# Patient Record
Sex: Female | Born: 1970 | Race: Black or African American | Hispanic: No | Marital: Single | Smoking: Current every day smoker
Health system: Southern US, Community
[De-identification: ages and names within clinical notes are randomized; demographics above are authoritative.]

## PROBLEM LIST (undated history)

## (undated) DIAGNOSIS — M1990 Unspecified osteoarthritis, unspecified site: Secondary | ICD-10-CM

## (undated) DIAGNOSIS — T7840XA Allergy, unspecified, initial encounter: Secondary | ICD-10-CM

## (undated) DIAGNOSIS — F32A Depression, unspecified: Secondary | ICD-10-CM

## (undated) DIAGNOSIS — I739 Peripheral vascular disease, unspecified: Secondary | ICD-10-CM

## (undated) DIAGNOSIS — Z9289 Personal history of other medical treatment: Secondary | ICD-10-CM

## (undated) DIAGNOSIS — Z8489 Family history of other specified conditions: Secondary | ICD-10-CM

## (undated) DIAGNOSIS — F419 Anxiety disorder, unspecified: Secondary | ICD-10-CM

## (undated) DIAGNOSIS — S129XXA Fracture of neck, unspecified, initial encounter: Secondary | ICD-10-CM

## (undated) DIAGNOSIS — E559 Vitamin D deficiency, unspecified: Secondary | ICD-10-CM

## (undated) DIAGNOSIS — K219 Gastro-esophageal reflux disease without esophagitis: Secondary | ICD-10-CM

## (undated) DIAGNOSIS — I219 Acute myocardial infarction, unspecified: Secondary | ICD-10-CM

## (undated) DIAGNOSIS — J45909 Unspecified asthma, uncomplicated: Secondary | ICD-10-CM

## (undated) DIAGNOSIS — R921 Mammographic calcification found on diagnostic imaging of breast: Secondary | ICD-10-CM

## (undated) DIAGNOSIS — F329 Major depressive disorder, single episode, unspecified: Secondary | ICD-10-CM

## (undated) DIAGNOSIS — F319 Bipolar disorder, unspecified: Secondary | ICD-10-CM

## (undated) DIAGNOSIS — M5020 Other cervical disc displacement, unspecified cervical region: Secondary | ICD-10-CM

## (undated) DIAGNOSIS — D649 Anemia, unspecified: Secondary | ICD-10-CM

## (undated) DIAGNOSIS — E785 Hyperlipidemia, unspecified: Secondary | ICD-10-CM

## (undated) HISTORY — DX: Allergy, unspecified, initial encounter: T78.40XA

## (undated) HISTORY — PX: APPENDECTOMY: SHX54

## (undated) HISTORY — DX: Anemia, unspecified: D64.9

## (undated) HISTORY — PX: FOOT SURGERY: SHX648

## (undated) HISTORY — PX: ABDOMINAL SURGERY: SHX537

## (undated) HISTORY — PX: TUBAL LIGATION: SHX77

## (undated) HISTORY — DX: Hyperlipidemia, unspecified: E78.5

## (undated) HISTORY — PX: BLADDER SURGERY: SHX569

## (undated) HISTORY — PX: BREAST EXCISIONAL BIOPSY: SUR124

## (undated) HISTORY — DX: Vitamin D deficiency, unspecified: E55.9

## (undated) HISTORY — PX: WISDOM TOOTH EXTRACTION: SHX21

---

## 1998-12-09 ENCOUNTER — Inpatient Hospital Stay (HOSPITAL_COMMUNITY): Admission: AD | Admit: 1998-12-09 | Discharge: 1998-12-09 | Payer: Self-pay | Admitting: Obstetrics

## 2002-07-09 ENCOUNTER — Emergency Department (HOSPITAL_COMMUNITY): Admission: EM | Admit: 2002-07-09 | Discharge: 2002-07-09 | Payer: Self-pay | Admitting: *Deleted

## 2004-12-11 ENCOUNTER — Emergency Department (HOSPITAL_COMMUNITY): Admission: EM | Admit: 2004-12-11 | Discharge: 2004-12-11 | Payer: Self-pay | Admitting: Emergency Medicine

## 2004-12-13 ENCOUNTER — Emergency Department (HOSPITAL_COMMUNITY): Admission: EM | Admit: 2004-12-13 | Discharge: 2004-12-13 | Payer: Self-pay | Admitting: *Deleted

## 2004-12-14 ENCOUNTER — Emergency Department (HOSPITAL_COMMUNITY): Admission: EM | Admit: 2004-12-14 | Discharge: 2004-12-15 | Payer: Self-pay | Admitting: Emergency Medicine

## 2004-12-18 ENCOUNTER — Encounter: Admission: RE | Admit: 2004-12-18 | Discharge: 2004-12-18 | Payer: Self-pay | Admitting: Cardiology

## 2005-02-03 ENCOUNTER — Emergency Department (HOSPITAL_COMMUNITY): Admission: EM | Admit: 2005-02-03 | Discharge: 2005-02-03 | Payer: Self-pay | Admitting: Emergency Medicine

## 2005-03-29 ENCOUNTER — Emergency Department (HOSPITAL_COMMUNITY): Admission: EM | Admit: 2005-03-29 | Discharge: 2005-03-29 | Payer: Self-pay | Admitting: Emergency Medicine

## 2005-05-02 ENCOUNTER — Inpatient Hospital Stay (HOSPITAL_COMMUNITY): Admission: AD | Admit: 2005-05-02 | Discharge: 2005-05-02 | Payer: Self-pay | Admitting: Obstetrics

## 2006-03-11 ENCOUNTER — Emergency Department (HOSPITAL_COMMUNITY): Admission: EM | Admit: 2006-03-11 | Discharge: 2006-03-11 | Payer: Self-pay | Admitting: Emergency Medicine

## 2006-07-09 ENCOUNTER — Emergency Department (HOSPITAL_COMMUNITY): Admission: EM | Admit: 2006-07-09 | Discharge: 2006-07-09 | Payer: Self-pay | Admitting: Emergency Medicine

## 2006-09-07 ENCOUNTER — Encounter: Admission: RE | Admit: 2006-09-07 | Discharge: 2006-09-07 | Payer: Self-pay | Admitting: Cardiology

## 2006-10-04 ENCOUNTER — Inpatient Hospital Stay (HOSPITAL_COMMUNITY): Admission: AD | Admit: 2006-10-04 | Discharge: 2006-10-04 | Payer: Self-pay | Admitting: Obstetrics

## 2006-11-22 ENCOUNTER — Emergency Department (HOSPITAL_COMMUNITY): Admission: EM | Admit: 2006-11-22 | Discharge: 2006-11-22 | Payer: Self-pay | Admitting: Emergency Medicine

## 2006-11-25 ENCOUNTER — Ambulatory Visit (HOSPITAL_BASED_OUTPATIENT_CLINIC_OR_DEPARTMENT_OTHER): Admission: RE | Admit: 2006-11-25 | Discharge: 2006-11-25 | Payer: Self-pay | Admitting: Urology

## 2007-02-24 ENCOUNTER — Ambulatory Visit (HOSPITAL_COMMUNITY): Admission: RE | Admit: 2007-02-24 | Discharge: 2007-02-24 | Payer: Self-pay | Admitting: Gastroenterology

## 2007-04-26 ENCOUNTER — Inpatient Hospital Stay (HOSPITAL_COMMUNITY): Admission: RE | Admit: 2007-04-26 | Discharge: 2007-04-27 | Payer: Self-pay | Admitting: General Surgery

## 2007-08-28 ENCOUNTER — Emergency Department (HOSPITAL_COMMUNITY): Admission: EM | Admit: 2007-08-28 | Discharge: 2007-08-28 | Payer: Self-pay | Admitting: Emergency Medicine

## 2007-09-11 ENCOUNTER — Emergency Department (HOSPITAL_COMMUNITY): Admission: EM | Admit: 2007-09-11 | Discharge: 2007-09-11 | Payer: Self-pay | Admitting: Emergency Medicine

## 2007-11-17 ENCOUNTER — Emergency Department (HOSPITAL_COMMUNITY): Admission: EM | Admit: 2007-11-17 | Discharge: 2007-11-17 | Payer: Self-pay | Admitting: Emergency Medicine

## 2007-11-23 ENCOUNTER — Emergency Department (HOSPITAL_COMMUNITY): Admission: EM | Admit: 2007-11-23 | Discharge: 2007-11-23 | Payer: Self-pay | Admitting: Emergency Medicine

## 2008-02-20 ENCOUNTER — Emergency Department (HOSPITAL_COMMUNITY): Admission: EM | Admit: 2008-02-20 | Discharge: 2008-02-20 | Payer: Self-pay | Admitting: Emergency Medicine

## 2008-05-05 ENCOUNTER — Emergency Department (HOSPITAL_COMMUNITY): Admission: EM | Admit: 2008-05-05 | Discharge: 2008-05-05 | Payer: Self-pay | Admitting: Emergency Medicine

## 2008-07-24 ENCOUNTER — Inpatient Hospital Stay (HOSPITAL_COMMUNITY): Admission: AD | Admit: 2008-07-24 | Discharge: 2008-07-24 | Payer: Self-pay | Admitting: Obstetrics

## 2008-10-16 ENCOUNTER — Emergency Department (HOSPITAL_COMMUNITY): Admission: EM | Admit: 2008-10-16 | Discharge: 2008-10-16 | Payer: Self-pay | Admitting: Emergency Medicine

## 2008-10-26 ENCOUNTER — Encounter: Admission: RE | Admit: 2008-10-26 | Discharge: 2008-12-11 | Payer: Self-pay | Admitting: Sports Medicine

## 2008-10-30 ENCOUNTER — Emergency Department (HOSPITAL_COMMUNITY): Admission: EM | Admit: 2008-10-30 | Discharge: 2008-10-30 | Payer: Self-pay | Admitting: Emergency Medicine

## 2009-04-23 ENCOUNTER — Inpatient Hospital Stay (HOSPITAL_COMMUNITY): Admission: AD | Admit: 2009-04-23 | Discharge: 2009-04-23 | Payer: Self-pay | Admitting: Obstetrics

## 2009-09-18 ENCOUNTER — Emergency Department (HOSPITAL_COMMUNITY): Admission: EM | Admit: 2009-09-18 | Discharge: 2009-09-18 | Payer: Self-pay | Admitting: Emergency Medicine

## 2009-12-31 ENCOUNTER — Emergency Department (HOSPITAL_COMMUNITY): Admission: EM | Admit: 2009-12-31 | Discharge: 2010-01-01 | Payer: Self-pay | Admitting: Emergency Medicine

## 2010-01-08 ENCOUNTER — Emergency Department (HOSPITAL_COMMUNITY): Admission: EM | Admit: 2010-01-08 | Discharge: 2010-01-09 | Payer: Self-pay | Admitting: Emergency Medicine

## 2010-07-16 ENCOUNTER — Emergency Department (HOSPITAL_COMMUNITY)
Admission: EM | Admit: 2010-07-16 | Discharge: 2010-07-16 | Payer: Self-pay | Source: Home / Self Care | Admitting: Emergency Medicine

## 2010-09-24 ENCOUNTER — Emergency Department (HOSPITAL_COMMUNITY)
Admission: EM | Admit: 2010-09-24 | Discharge: 2010-09-24 | Payer: Self-pay | Source: Home / Self Care | Admitting: Emergency Medicine

## 2010-10-10 ENCOUNTER — Emergency Department (HOSPITAL_COMMUNITY): Admission: EM | Admit: 2010-10-10 | Discharge: 2010-10-10 | Payer: Self-pay | Admitting: Emergency Medicine

## 2010-11-22 ENCOUNTER — Emergency Department (HOSPITAL_COMMUNITY)
Admission: EM | Admit: 2010-11-22 | Discharge: 2010-11-22 | Payer: Self-pay | Source: Home / Self Care | Admitting: Emergency Medicine

## 2011-01-04 ENCOUNTER — Encounter: Payer: Self-pay | Admitting: Cardiology

## 2011-02-23 LAB — STREP A DNA PROBE: Group A Strep Probe: NEGATIVE

## 2011-02-23 LAB — RAPID STREP SCREEN (MED CTR MEBANE ONLY): Streptococcus, Group A Screen (Direct): NEGATIVE

## 2011-03-01 LAB — DIFFERENTIAL
Basophils Absolute: 0.1 10*3/uL (ref 0.0–0.1)
Basophils Relative: 1 % (ref 0–1)
Eosinophils Absolute: 0.1 10*3/uL (ref 0.0–0.7)
Eosinophils Relative: 1 % (ref 0–5)
Lymphocytes Relative: 36 % (ref 12–46)
Lymphs Abs: 3.2 10*3/uL (ref 0.7–4.0)
Monocytes Absolute: 0.9 10*3/uL (ref 0.1–1.0)
Monocytes Relative: 10 % (ref 3–12)
Neutro Abs: 4.8 10*3/uL (ref 1.7–7.7)
Neutrophils Relative %: 53 % (ref 43–77)

## 2011-03-01 LAB — POCT I-STAT, CHEM 8
BUN: 5 mg/dL — ABNORMAL LOW (ref 6–23)
Calcium, Ion: 1.08 mmol/L — ABNORMAL LOW (ref 1.12–1.32)
Chloride: 107 mEq/L (ref 96–112)
Creatinine, Ser: 0.8 mg/dL (ref 0.4–1.2)
Glucose, Bld: 97 mg/dL (ref 70–99)
HCT: 42 % (ref 36.0–46.0)
Hemoglobin: 14.3 g/dL (ref 12.0–15.0)
Potassium: 3.6 mEq/L (ref 3.5–5.1)
Sodium: 138 mEq/L (ref 135–145)
TCO2: 26 mmol/L (ref 0–100)

## 2011-03-01 LAB — POCT CARDIAC MARKERS
CKMB, poc: <1 ng/mL — ABNORMAL LOW (ref 1.0–8.0)
CKMB, poc: <1 ng/mL — ABNORMAL LOW (ref 1.0–8.0)
Myoglobin, poc: 24.8 ng/mL (ref 12–200)
Myoglobin, poc: 29.7 ng/mL (ref 12–200)
Troponin i, poc: <0.05 ng/mL (ref 0.00–0.09)
Troponin i, poc: <0.05 ng/mL (ref 0.00–0.09)

## 2011-03-01 LAB — CBC
HCT: 39.2 % (ref 36.0–46.0)
Hemoglobin: 13 g/dL (ref 12.0–15.0)
MCHC: 33.3 g/dL (ref 30.0–36.0)
MCV: 90.5 fL (ref 78.0–100.0)
Platelets: 357 10*3/uL (ref 150–400)
RBC: 4.33 MIL/uL (ref 3.87–5.11)
RDW: 14.8 % (ref 11.5–15.5)
WBC: 9 10*3/uL (ref 4.0–10.5)

## 2011-03-01 LAB — D-DIMER, QUANTITATIVE: D-Dimer, Quant: 0.28 ug/mL-FEU (ref 0.00–0.48)

## 2011-03-01 LAB — POCT PREGNANCY, URINE: Preg Test, Ur: NEGATIVE

## 2011-03-24 LAB — GC/CHLAMYDIA PROBE AMP, GENITAL
Chlamydia, DNA Probe: NEGATIVE
GC Probe Amp, Genital: NEGATIVE

## 2011-03-24 LAB — URINALYSIS, ROUTINE W REFLEX MICROSCOPIC
Bilirubin Urine: NEGATIVE
Hgb urine dipstick: NEGATIVE
Ketones, ur: 15 mg/dL — AB
Protein, ur: NEGATIVE mg/dL
Specific Gravity, Urine: 1.02 (ref 1.005–1.030)
Urobilinogen, UA: 0.2 mg/dL (ref 0.0–1.0)

## 2011-03-24 LAB — WET PREP, GENITAL: Trich, Wet Prep: NONE SEEN

## 2011-03-24 LAB — HERPES SIMPLEX VIRUS CULTURE: Culture: NOT DETECTED

## 2011-05-01 NOTE — Op Note (Signed)
NAME:  Jasmin Stephenson, Jasmin Stephenson NO.:  [id number]   MEDICAL RECORD NO.:  [id number]          PATIENT TYPE:  AMB   LOCATION:  [facility]                         FACILITY:  [facility]   PHYSICIAN:  Maretta Bees. Vonita Moss, M.D.DATE OF BIRTH:  April 09, 1971   DATE OF PROCEDURE:  11/25/2006  DATE OF DISCHARGE:                               OPERATIVE REPORT   PREOPERATIVE DIAGNOSES:  1. Right hydronephrosis.  2. Right pyelonephritis.  3. Possible ureteral stone.   POSTOPERATIVE DIAGNOSES:  1. Right hydronephrosis.  2. Right pyelonephritis.  3. Possible ureteral stone.   PROCEDURE:  Cystoscopy, right ureteroscopy, right retrograde pyelogram  with interpretation, right double-J catheter insertion.   SURGEON:  Dr. Larey Dresser   ANESTHESIA:  General.   INDICATIONS:  This 40 year old lady has had a few weeks of intermittent  right flank pain and got worse over the weekend.  A CT scan showed right  hydronephrosis and lateral pelvic phleboliths but no definitive stone.  She has also been running fever, and I saw her in the office yesterday,  and her urine looked infected and was cultured, and I put her on Proquin  which is enteric-coated Cipro.  She is brought to the OR today for  further evaluation of her hydronephrosis and rule out the possibility of  an obstructing stone.   PROCEDURE:  The patient was brought to the operating room and placed in  lithotomy position.  External genitalia were prepped and draped in the  usual fashion.  She was cystoscoped, and the bladder was normal except  for a lot of whitish, flaky trigonitis.  I could see the left orifice,  but I could not see the right one but, fortunately, in estimating where  the right ureteral orifice was, I was able to catheterize it with a  Sensor guidewire, and then it went easily up the ureter.  I saw no  definitive stones.  Right ureteroscopy with the short rigid scope was  then performed, and I saw no evidence of calculi.  I  would say she could  have had a tiny calculi flushed up toward the kidney or might have  already passed a small stone, but the hydronephrosis that she had may  all have been related to edema or poor peristalsis from effects of the  UTI and the ureter.   Retrograde pyelogram was performed by injecting contrast through the  ureteroscope.  The ureter looked fairly delicate.  The calyces were a  little full, but no obvious obstructing lesion was seen.   I felt in view of her fever and hydronephrosis on CT that I would leave  up the double-J catheter, so I inserted a 6-French 26 cm contour double-  J with a full coil in the renal pelvis and a full coil in the bladder,  and the string was brought up per urethra and taped to her thigh.  She  was then taken to the recovery room in good condition having tolerated  the procedure well.      Maretta Bees. Vonita Moss, M.D.  Electronically Signed     LJP/MEDQ  D:  11/25/2006  T:  11/25/2006  Job:  [id number]

## 2011-05-01 NOTE — Op Note (Signed)
NAME:  Jasmin Stephenson, Jasmin Stephenson                ACCOUNT NO.:  [id number]   MEDICAL RECORD NO.:  [id number]          PATIENT TYPE:  AMB   LOCATION:  ENDO                         FACILITY:  [facility]   PHYSICIAN:  John C. Madilyn Fireman, M.D.    DATE OF BIRTH:  [birth year]   DATE OF PROCEDURE:  DATE OF DISCHARGE:  02/24/2007                               OPERATIVE REPORT   PROCEDURE:  48-hour esophageal pH study.   INDICATIONS FOR PROCEDURE:  Patient with reflux like dyspepsia which  been quite refractory to medical management with double dose proton pump  inhibitor.  There has been some discrepancy regarding of impression of a  hiatal hernia on EGD and absence of seen hiatal hernia on upper GI  series.  She is under consideration for a Nissen fundoplication.  Procedure is to better quantitate her degree of reflux before proceeding  with this.  We had tried to place a standard manometry probe and do a  standard 24-hour pH study but the nurses were unable to pass this and  manometric information is not available.   RESULTS:  The patient's overall Demeester score was 37.6 with normal  less than 14.72.  She had a total of 190 reflux episodes and heartburn  reported over total of 22 minutes with seven documented episodes of  reflux during this time.  Total fraction of time the pH was less than  four was 10.8%.   IMPRESSION:  Abnormal total reflux scores with fairly good symptom  correlation to reported heartburn.   PLAN:  Will discuss the possibility of continuing with plans for  antireflux surgery with Dr. Abbey Chatters.           ______________________________  Everardo All Madilyn Fireman, M.D.     JCH/MEDQ  D:  03/03/2007  T:  03/03/2007  Job:  [id number]   cc:   Osvaldo Shipper. Spruill, M.D.  Adolph Pollack, M.D.

## 2011-05-01 NOTE — Op Note (Signed)
NAME:  Jasmin Stephenson, Jasmin Stephenson                ACCOUNT NO.:  [id number]   MEDICAL RECORD NO.:  [id number]          PATIENT TYPE:  AMB   LOCATION:  ENDO                         FACILITY:  [facility]   PHYSICIAN:  John C. Madilyn Fireman, M.D.    DATE OF BIRTH:  [birth year]   DATE OF PROCEDURE:  02/24/2007  DATE OF DISCHARGE:                               OPERATIVE REPORT   PROCEDURE:  Esophagogastroduodenoscopy with placement of a 24-hour pH  capsule.   INDICATIONS FOR PROCEDURE:  Gastroesophageal reflux refractory to  medical management although hiatal hernia not clearly seen on barium  swallow or esophagogastroduodenoscopy.  Procedure is to formally assess  her reflux by 24-hour monitoring prior to considering antireflux  surgery.   PROCEDURE:  The patient was placed in the left lateral decubitus  position and placed on the pulse monitor with continuous low-flow oxygen  delivered by nasal cannula.  She was sedated with 75 mcg IV fentanyl and  8 mg IV Versed.  Olympus video endoscope was advanced under direct  vision into the oropharynx and esophagus. The esophagus was straight and  of normal caliber.  The squamocolumnar line at 36 cm.  There was no  visible esophagitis.  There was a lot of retching and inability to hold  air as before and a 2 cm sliding hiatal hernia could be appreciated.  More dramatically, however, as before when the stomach was entered and  air used to insufflate the stomach, she could not hold air for very long  at all, making it very difficult to adequately inspect the gastric  lumen.  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  Otherwise, fundus, body, antrum and pylorus all appeared  normal.  The duodenum was entered and both bulb and second portion were  well inspected and appeared to be within normal limits.  Scope was drawn  back into the stomach and the scope was then withdrawn back into the  esophagus and placement of the Bravo capsule was marked at 30  cm, 6 cm  above the squamocolumnar line.  The scope was then withdrawn, the Bravo  capsule deployed.  After this was done the scope was reintroduced and  the Bravo capsule was seen to be embedded in the esophageal wall from  approximately 26-30 cm. The scope was then withdrawn and the patient  returned to the recovery room in stable condition.  She tolerated the  procedure well.  There were no immediate complications.   IMPRESSION:  Small sliding hiatal hernia with evidence of extremely  incompetent gastroesophageal junction based on belching and inability to  hold air during the procedure.   PLAN:  Await data from the 24-hour pH study prior to deciding whether to  proceed with antireflux surgery.           ______________________________  Everardo All Madilyn Fireman, M.D.     JCH/MEDQ  D:  02/24/2007  T:  02/25/2007  Job:  [id number]   cc:   Kerin Perna, M.D.  Emmaline Life, M.D.

## 2011-05-01 NOTE — Op Note (Signed)
NAME:  Jasmin Stephenson, Jasmin Stephenson NO.:  [id number]   MEDICAL RECORD NO.:  [id number]          PATIENT TYPE:  AMB   LOCATION:  DAY                          [facility]ACILITY:  [facility]   PHYSICIAN:  Adolph Pollack, M.D.DATE OF BIRTH:  11-11-1971   DATE OF PROCEDURE:  04/25/2007  DATE OF DISCHARGE:                               OPERATIVE REPORT   PREOPERATIVE DIAGNOSIS:  Medically refractory gastroesophageal reflux  disease.   POSTOPERATIVE DIAGNOSIS:  Medically refractory gastroesophageal reflux  disease.   PROCEDURE:  Laparoscopic Nissen fundoplication.   SURGEON:  Adolph Pollack, M.D.   ASSISTANT:  Leonie Man, MD.   ANESTHESIA:  General.   INDICATIONS:  Ms. Leveille is a 40 year old female who suffers from  gastroesophageal reflux disease.  Despite medical management, she  continues to have breakthrough symptoms.  She now presents for elective  fundoplication.   TECHNIQUE:  She is seen in the holding area, then brought to the  operating room, placed supine on the operating table and general  anesthetic was administered.  Foley catheter was placed in her bladder.  The abdominal wall was sterilely prepped and draped.  Marcaine solution  was infiltrated in the supraumbilical region.  A supraumbilical incision  was made through the skin and subcutaneous tissue, fascia and peritoneum  entering the peritoneal cavity under direct vision.  A pursestring  suture of 0 Vicryl was placed around the fascial edges.  A Hassan trocar  was introduced into the peritoneal cavity and pneumoperitoneum created  by insufflation of CO2 gas.   The laparoscope was introduced.  Two 10 mm trocars were placed in right  upper quadrant, one in the left upper quadrant, and one 5 mm trocar was  temporarily placed in the subxiphoid area then removed.  Through the  subxiphoid incision, the Nathanson retractor was then inserted and the  left lobe of the liver was retracted anteriorly exposing the  hiatus.  I  did not notice a significant hiatal hernia.  The gastroesophageal  junction was carefully retracted to the left.  The thin gastrohepatic  attachments were then divided with the harmonic scalpel up to the level  of the right crus.  The phrenoesophageal tissue was then divided  anteriorly exposing the esophagus and the left crus.  Using careful  blunt dissection the right crus was separated from the esophagus.  A  retroesophageal window was then performed and the vagus nerve kept with  the esophagus.   The fundus of the stomach was grasped and then short gastric vessels  were divided with the harmonic scalpel up to the area of the left crus  mobilizing the fundus.  No significant bleeding was noted.   Following this, a Penrose drain was placed around the gastroesophageal  junction and then this was retracted anteriorly exposing the hiatus.  I  tightened up the hiatus using 2-0 nonabsorbable sutures.  I then grasped  the fundus and brought it through the retroesophageal window for a 360  degree wrap.  The shoeshine maneuver was performed easily.  A size 50  lighted bougie was then passed down through the  esophagus into the  stomach.  A 360 degree fundoplication was created using size 0  interrupted nonabsorbable suture.  The first two stitches involved the  left leaf of the wrap, the right leaf of the wrap, and a small bite of  esophagus.  The third suture only involved the left leaf and the right  leaf of the wrap.  The dilator was removed.  The wrap was floppy and  under no tension.   I then inspected the area.  No evidence of esophageal, gastric, splenic,  hepatic, or intestinal injury was noted.  No bleeding was noted.  The  Nathanson retractor was then removed.  The trocars were subsequently  removed and the pneumoperitoneum was released.   The supraumbilical fascial defect was closed by tightening up and tying  down the pursestring sutures.  All skin incisions were  closed with 4-0  Monocryl subcuticular stitches, followed by Steri-Strips and sterile  dressings.   She tolerated the procedure without any apparent complications and was  taken to the recovery room in satisfactory condition.      Adolph Pollack, M.D.  Electronically Signed     TJR/MEDQ  D:  04/25/2007  T:  04/25/2007  Job:  [id number]   cc:   Everardo All. Madilyn Fireman, M.D.  Fax: [phone]   Osvaldo Shipper. Spruill, M.D.  Fax: [phone]

## 2011-05-01 NOTE — H&P (Signed)
NAME:  Jasmin Stephenson, Jasmin Stephenson NO.:  [id number]   MEDICAL RECORD NO.:  [id number]          PATIENT TYPE:  AMB   LOCATION:  DAY                          FACILITY:  [facility]   PHYSICIAN:  Adolph Pollack, M.D.DATE OF BIRTH:  1970/12/31   DATE OF ADMISSION:  04/25/2007  DATE OF DISCHARGE:                              HISTORY & PHYSICAL   REASON FOR ADMISSION:  Elective Nissen fundoplication.   HISTORY OF PRESENT ILLNESS:  Jasmin Stephenson is a 40 year old female who has  had gastroesophageal reflux disease.  She has a significant abnormal 24  hour pH study with DeMeester score of 33.  She did not tolerate  manometry well.  She has no dysphasia.  Ultrasound was negative for  gallstones.  She takes Zegerid twice a day with the last breakthrough  symptoms.  She had an upper GI which was normal.  She now presents for  elective Nissen fundoplication.   PAST MEDICAL HISTORY:  1. Acute appendicitis.  2. Gastroesophageal reflux disease.   PREVIOUS OPERATIONS:  1. Appendectomy.  2. Tubal ligation.   ALLERGIES:  PENICILLIN causes a rash.   MEDICATIONS:  Zegerid.   SOCIAL HISTORY:  She is a smoker.  Occasionally has an alcoholic  beverage.  She is single and has 3 children.   PHYSICAL EXAMINATION:  Generally she is an overweight female in no acute  distress, pleasant and cooperative.  VITAL SIGNS:  Temperature is 98 degrees, blood pressure is 124/83 of  pulses 88.  O2 sats 99% on room air.  EYES:  Extraocular motion intact. No icterus.  NECK:  Supple without obvious masses.  RESPIRATORY:  Breath sounds equal and clear respirations unlabored.  CARDIOVASCULAR:  Demonstrates regular rate regular rhythm.  No murmur  heard.  ABDOMEN:  Soft, nontender.  There is a right lower quadrant scar  present.  EXTREMITIES:  Has SCD hose on.   IMPRESSION:  Medically refractory gastroesophageal reflux disease.   PLAN:  Laparoscopic, possible open Nissen fundoplication.  We have gone  over the  procedure, risks and aftercare preoperatively.      Adolph Pollack, M.D.  Electronically Signed     TJR/MEDQ  D:  04/25/2007  T:  04/25/2007  Job:  [id number]

## 2012-01-25 ENCOUNTER — Emergency Department (HOSPITAL_COMMUNITY)
Admission: EM | Admit: 2012-01-25 | Discharge: 2012-01-25 | Disposition: A | Discharge status: Home / Self Care | Payer: Self-pay | Attending: Emergency Medicine | Admitting: Emergency Medicine

## 2012-01-25 ENCOUNTER — Encounter (HOSPITAL_COMMUNITY): Payer: Self-pay | Admitting: *Deleted

## 2012-01-25 DIAGNOSIS — R21 Rash and other nonspecific skin eruption: Secondary | ICD-10-CM

## 2012-01-25 DIAGNOSIS — L299 Pruritus, unspecified: Secondary | ICD-10-CM | POA: Insufficient documentation

## 2012-01-25 DIAGNOSIS — F17200 Nicotine dependence, unspecified, uncomplicated: Secondary | ICD-10-CM | POA: Insufficient documentation

## 2012-01-25 DIAGNOSIS — B86 Scabies: Secondary | ICD-10-CM | POA: Insufficient documentation

## 2012-01-25 MED ORDER — PERMETHRIN 5 % EX CREA: TOPICAL_CREAM | CUTANEOUS | Status: AC

## 2012-01-25 NOTE — ED Notes (Signed)
Pt states "I've had this rash for about 3 days, it itches, I thought I had got in something outside or something I clean with"

## 2012-01-25 NOTE — ED Provider Notes (Signed)
History     CSN: [id number]  Arrival date & time 01/25/12  1032   First MD Initiated Contact with Patient 01/25/12 1117      Chief Complaint  Patient presents with  . Rash    (Consider location/radiation/quality/duration/timing/severity/associated sxs/prior treatment) HPI The patient presents with a rash on her arms, chest, under both breasts and the creases in her groin for the last 3 days. The patient states that it itches pretty intensely. She denies any exposures to new or different chemicals or usual bath or laundry detergents or agents. The patient has no other symptoms associated with this rash.  History reviewed. No pertinent past medical history.  Past Surgical History  Procedure Date  . Abdominal surgery   . Bladder surgery     No family history on file.  History  Substance Use Topics  . Smoking status: Current Everyday Smoker -- 1.5 packs/day  . Smokeless tobacco: Not on file  . Alcohol Use: 1.2 oz/week    2 Cans of beer per week    OB History    Grav Para Term Preterm Abortions TAB SAB Ect Mult Living                  Review of Systems All pertinent positives and negatives reviewed in the history of present illness; Allergies  Penicillins  Home Medications  No current outpatient prescriptions on file.  BP 117/84  Pulse 73  Temp(Src) 98.4 F (36.9 C) (Oral)  Resp 16  Wt 180 lb (81.647 kg)  SpO2 98%  LMP 01/22/2012  Physical Exam  Constitutional: She appears well-developed and well-nourished.  Cardiovascular: Normal rate, regular rhythm and normal heart sounds.   Pulmonary/Chest: Effort normal and breath sounds normal.  Skin: Rash noted.       The patient has a sporadic single papular rash noted to her arms, chest, area under breasts and groin. These follow no pattern      ED Course  Procedures (including critical care time)   The patient has a rash that is not consistent with allergic reaction or hives. The patient will be treated for  scabies based on the location and appearance of the rash.        MDM  See above comments.         Carlyle Dolly, PA-C 01/25/12 1137

## 2012-01-25 NOTE — ED Provider Notes (Signed)
Medical screening examination/treatment/procedure(s) were performed by non-physician practitioner and as supervising physician I was immediately available for consultation/collaboration. Devoria Albe, MD, FACEP   Ward Givens, MD 01/25/12 [phone]

## 2012-01-25 NOTE — ED Notes (Signed)
Requested PA-C to see

## 2012-02-03 ENCOUNTER — Emergency Department (INDEPENDENT_AMBULATORY_CARE_PROVIDER_SITE_OTHER)
Admission: EM | Admit: 2012-02-03 | Discharge: 2012-02-03 | Disposition: A | Discharge status: Home Health | Payer: Self-pay | Source: Home / Self Care

## 2012-02-03 ENCOUNTER — Encounter (HOSPITAL_COMMUNITY): Payer: Self-pay | Admitting: Emergency Medicine

## 2012-02-03 DIAGNOSIS — B86 Scabies: Secondary | ICD-10-CM

## 2012-02-03 MED ORDER — PERMETHRIN 5 % EX CREA: TOPICAL_CREAM | CUTANEOUS | Status: AC

## 2012-02-03 NOTE — ED Notes (Addendum)
PT HERE WITH UNRESOLVED ITCHING AND IRRITATION TO BILAT GROIN AREA S/P SCABIES INFECTION DIAG X2 WEEKS AGO @ [facility] ER.PT WAS PRESCRIBED PERMETHRIN CREAM BUT DIDN'T REPEAT IN THE NEXT WEEK.PT STATES I THINK THEY MIS DIAGNOSED ME.RASH HEALED BUT ITCHING NOT RESOLVED.

## 2012-02-03 NOTE — Discharge Instructions (Signed)
Apply the permethrin cream at bedtime as discussed. Do not wash your hands after applying the cream. You may take Benadryl or Claritin for itching. The itching may last for up to one week after treatment. Return as needed.

## 2012-02-03 NOTE — ED Provider Notes (Signed)
History     CSN: [id number]  Arrival date & time 02/03/12  4132   None     Chief Complaint  Patient presents with  . Rash  . Re-evaluation    (Consider location/radiation/quality/duration/timing/severity/associated sxs/prior treatment) HPI Comments: Patient presents with complaints of an itchy rash all over her body. She states the rash started on her arms and now and then spread. Itching is worse after showering and at night. She was seen in the ED and 01/25/2012 and treated for scabies. Itching and rash persisted despite her treatment. Patient admits that she applied between during the day and wash her hands after application. She did wash her bedding and clothing after treatment. She has 3 sons who live with her in the home who are asymptomatic. She denies any new soaps, detergents, lotions, or foods. She does not work.   History reviewed. No pertinent past medical history.  Past Surgical History  Procedure Date  . Abdominal surgery   . Bladder surgery     History reviewed. No pertinent family history.  History  Substance Use Topics  . Smoking status: Current Everyday Smoker -- 1.5 packs/day  . Smokeless tobacco: Not on file  . Alcohol Use: 1.2 oz/week    2 Cans of beer per week    OB History    Grav Para Term Preterm Abortions TAB SAB Ect Mult Living                  Review of Systems  Constitutional: Negative for fever and chills.  HENT: Negative for ear pain, congestion, sore throat, rhinorrhea and sneezing.   Respiratory: Negative for cough.     Allergies  Penicillins  Home Medications   Current Outpatient Rx  Name Route Sig Dispense Refill  . PERMETHRIN 5 % EX CREA  Apply at bedtime as directed for 8-12 hrs. 60 g 0    BP 135/90  Pulse 76  Temp(Src) 98.1 F (36.7 C) (Oral)  Resp 17  SpO2 98%  LMP 01/22/2012  Physical Exam  Nursing note and vitals reviewed. Constitutional: She appears well-developed and well-nourished. No distress.  HENT:    Head: Normocephalic and atraumatic.  Right Ear: Tympanic membrane, external ear and ear canal normal.  Left Ear: Tympanic membrane, external ear and ear canal normal.  Nose: Nose normal.  Mouth/Throat: Uvula is midline, oropharynx is clear and moist and mucous membranes are normal. No oropharyngeal exudate, posterior oropharyngeal edema or posterior oropharyngeal erythema.  Neck: Neck supple.  Cardiovascular: Normal rate, regular rhythm and normal heart sounds.   Pulmonary/Chest: Effort normal and breath sounds normal. No respiratory distress.  Lymphadenopathy:    She has no cervical adenopathy.  Neurological: She is alert.  Skin: Skin is warm and dry.       Papular rash noted scattered on UE, neck and thorax. Papules also noted on web spaces of hands.   Psychiatric: She has a normal mood and affect.    ED Course  Procedures (including critical care time)  Labs Reviewed - No data to display No results found.   1. Scabies       MDM  Pt seen in Ed 01-25-12 for same, but inadequately treated. Discussed proper treatment regimen.         Melody Comas, [place] 02/03/12 1053

## 2012-02-03 NOTE — ED Provider Notes (Signed)
Medical screening examination/treatment/procedure(s) were performed by non-physician practitioner and as supervising physician I was immediately available for consultation/collaboration.  Corrie Mckusick, MD 02/03/12 2212

## 2013-02-13 ENCOUNTER — Encounter (HOSPITAL_COMMUNITY): Payer: Self-pay | Admitting: Emergency Medicine

## 2013-02-13 ENCOUNTER — Emergency Department (HOSPITAL_COMMUNITY)
Admission: EM | Admit: 2013-02-13 | Discharge: 2013-02-13 | Disposition: A | Discharge status: Home / Self Care | Payer: Self-pay | Attending: Emergency Medicine | Admitting: Emergency Medicine

## 2013-02-13 DIAGNOSIS — Z803 Family history of malignant neoplasm of breast: Secondary | ICD-10-CM | POA: Insufficient documentation

## 2013-02-13 DIAGNOSIS — Z79899 Other long term (current) drug therapy: Secondary | ICD-10-CM | POA: Insufficient documentation

## 2013-02-13 DIAGNOSIS — F17200 Nicotine dependence, unspecified, uncomplicated: Secondary | ICD-10-CM | POA: Insufficient documentation

## 2013-02-13 DIAGNOSIS — N63 Unspecified lump in unspecified breast: Secondary | ICD-10-CM | POA: Insufficient documentation

## 2013-02-13 MED ORDER — IBUPROFEN 800 MG PO TABS: 800.0000 mg | ORAL_TABLET | Freq: Three times a day (TID) | ORAL | Status: DC

## 2013-02-13 NOTE — ED Provider Notes (Signed)
History     CSN: [id number]  Arrival date & time 02/13/13  1049   First MD Initiated Contact with Patient 02/13/13 1102      Chief Complaint  Patient presents with  . Arm Pain  . Breast Pain  . Cyst    (Consider location/radiation/quality/duration/timing/severity/associated sxs/prior treatment) HPI Comments: Patient presents today with a mass of her left breast.  She reports that she noticed the mass two days ago.  Mass is very tender.  She reports that she normally performs breast self exams monthly.  She did not notice a mass prior to two days ago.  No prior history of Breast Cancer.  However, her sister has Breast Cancer.  She denies any erythema or warmth of the skin of the breast.  Denies fever or chills.  She denies nipple discharge.  Denies any retractions or other changes in the skin.  LMP was 02/06/13 and was normal.  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  She has never had an Ultrasound or Mammogram of the breast.   The history is provided by the patient.    History reviewed. No pertinent past medical history.  Past Surgical History  Procedure Laterality Date  . Abdominal surgery    . Bladder surgery      No family history on file.  History  Substance Use Topics  . Smoking status: Current Every Day Smoker -- 1.50 packs/day  . Smokeless tobacco: Not on file  . Alcohol Use: 1.2 oz/week    2 Cans of beer per week    OB History   Grav Para Term Preterm Abortions TAB SAB Ect Mult Living                  Review of Systems  Constitutional: Negative for fever and chills.  Skin: Negative for color change.       Left breast mass and tenderness  All other systems reviewed and are negative.    Allergies  Penicillins  Home Medications   Current Outpatient Rx  Name  Route  Sig  Dispense  Refill  . risperiDONE (RISPERDAL) 1 MG tablet   Oral   Take 1 mg by mouth daily.           BP 128/85  Pulse 78  Temp(Src) 99.5 F (37.5 C) (Oral)  Resp 19   SpO2 100%  LMP 02/10/2013  Physical Exam  Nursing note and vitals reviewed. Constitutional: She appears well-developed and well-nourished.  HENT:  Head: Normocephalic and atraumatic.  Cardiovascular: Normal rate, regular rhythm, normal heart sounds and intact distal pulses.   Pulmonary/Chest: Effort normal and breath sounds normal. No respiratory distress. She has no wheezes. She has no rales. She exhibits no edema, no deformity and no retraction. Right breast exhibits no inverted nipple, no mass, no nipple discharge, no skin change and no tenderness. Left breast exhibits mass and tenderness. Left breast exhibits no inverted nipple, no nipple discharge and no skin change. Breasts are symmetrical.  Approximately 2 cm diameter freely movable deep cystic mass at the 3:00 position.  Mass tender to palpation. Fibrocystic breasts.    Lymphadenopathy:    She has no axillary adenopathy.  Neurological: She is alert.  Skin: Skin is warm and dry. No erythema.  Psychiatric: She has a normal mood and affect.    ED Course  Procedures (including critical care time)  Labs Reviewed - No data to display No results found.   No diagnosis found.    MDM  Patient  presenting with a chief complaint of tender mass of her breast.  Mass is not superficial.  No overlying erythema or warmth.  Therefore, do not feel that mass is an abscess.  Patient non toxic appearing.  Patient does have FH of Breast Cancer.  Patient referred to [facility].  Patient explained the importance of following up and explained that further evaluation is needed to rule out Breast Cancer.  Patient verbalizes understanding.        Pascal Lux [place], PA-C 02/13/13 1859

## 2013-02-13 NOTE — ED Notes (Signed)
Voiced understanding of instructions given 

## 2013-02-13 NOTE — Progress Notes (Signed)
During [facility] ED 02/13/13 visit referral provided to [employer] liaison to offered services to assist with finding a [place] self pay provider, resources & health reform information She informed Liaison that she will apply for coverage on the affordable care act/health reform market place

## 2013-02-13 NOTE — ED Notes (Signed)
States that she noticed an nodule in her left breast 2 days ago that originates from her axillary area. Denies fever or chills. Nodule is palpable skin intact.

## 2013-02-15 NOTE — ED Provider Notes (Signed)
Medical screening examination/treatment/procedure(s) were performed by non-physician practitioner and as supervising physician I was immediately available for consultation/collaboration.  Anthony T Allen, MD 02/15/13 1350 

## 2013-04-10 ENCOUNTER — Emergency Department (HOSPITAL_COMMUNITY)
Admission: EM | Admit: 2013-04-10 | Discharge: 2013-04-10 | Disposition: A | Discharge status: Home / Self Care | Payer: Self-pay | Attending: Emergency Medicine | Admitting: Emergency Medicine

## 2013-04-10 ENCOUNTER — Encounter (HOSPITAL_COMMUNITY): Payer: Self-pay | Admitting: Emergency Medicine

## 2013-04-10 DIAGNOSIS — N6452 Nipple discharge: Secondary | ICD-10-CM

## 2013-04-10 DIAGNOSIS — N63 Unspecified lump in unspecified breast: Secondary | ICD-10-CM | POA: Insufficient documentation

## 2013-04-10 DIAGNOSIS — R928 Other abnormal and inconclusive findings on diagnostic imaging of breast: Secondary | ICD-10-CM | POA: Insufficient documentation

## 2013-04-10 DIAGNOSIS — N6459 Other signs and symptoms in breast: Secondary | ICD-10-CM | POA: Insufficient documentation

## 2013-04-10 DIAGNOSIS — Z880 Allergy status to penicillin: Secondary | ICD-10-CM | POA: Insufficient documentation

## 2013-04-10 DIAGNOSIS — F17200 Nicotine dependence, unspecified, uncomplicated: Secondary | ICD-10-CM | POA: Insufficient documentation

## 2013-04-10 DIAGNOSIS — L723 Sebaceous cyst: Secondary | ICD-10-CM | POA: Insufficient documentation

## 2013-04-10 DIAGNOSIS — Z8659 Personal history of other mental and behavioral disorders: Secondary | ICD-10-CM | POA: Insufficient documentation

## 2013-04-10 NOTE — ED Notes (Signed)
Pt complains of right nipple tenderness x 1 week. Pt reports "Last night I squeezed it and pus and blood came out"

## 2013-04-10 NOTE — ED Notes (Signed)
Pt also report rectal abscess at this time as well.

## 2013-04-11 ENCOUNTER — Other Ambulatory Visit: Payer: Self-pay | Admitting: Emergency Medicine

## 2013-04-11 ENCOUNTER — Telehealth (HOSPITAL_COMMUNITY): Payer: Self-pay | Admitting: Emergency Medicine

## 2013-04-11 DIAGNOSIS — N6320 Unspecified lump in the left breast, unspecified quadrant: Secondary | ICD-10-CM

## 2013-04-11 NOTE — ED Notes (Signed)
Call from pt.  Referred for Mammogram has tried to make an appt. but facility requiring more info.  LVM @ [phone] requesting callback to help pt facilitate getting an appt. for mammogram.

## 2013-04-12 ENCOUNTER — Other Ambulatory Visit: Payer: Self-pay | Admitting: *Deleted

## 2013-04-12 ENCOUNTER — Inpatient Hospital Stay (HOSPITAL_COMMUNITY)
Admission: AD | Admit: 2013-04-12 | Discharge: 2013-04-12 | Disposition: A | Discharge status: Home / Self Care | Payer: Self-pay | Source: Ambulatory Visit | Attending: Obstetrics | Admitting: Obstetrics

## 2013-04-12 ENCOUNTER — Telehealth (HOSPITAL_COMMUNITY): Payer: Self-pay | Admitting: Emergency Medicine

## 2013-04-12 ENCOUNTER — Encounter (HOSPITAL_COMMUNITY): Payer: Self-pay | Admitting: *Deleted

## 2013-04-12 DIAGNOSIS — N63 Unspecified lump in unspecified breast: Secondary | ICD-10-CM

## 2013-04-12 DIAGNOSIS — N760 Acute vaginitis: Secondary | ICD-10-CM | POA: Insufficient documentation

## 2013-04-12 DIAGNOSIS — N6452 Nipple discharge: Secondary | ICD-10-CM

## 2013-04-12 DIAGNOSIS — N949 Unspecified condition associated with female genital organs and menstrual cycle: Secondary | ICD-10-CM | POA: Insufficient documentation

## 2013-04-12 DIAGNOSIS — A5901 Trichomonal vulvovaginitis: Secondary | ICD-10-CM | POA: Insufficient documentation

## 2013-04-12 DIAGNOSIS — A499 Bacterial infection, unspecified: Secondary | ICD-10-CM | POA: Insufficient documentation

## 2013-04-12 DIAGNOSIS — B9689 Other specified bacterial agents as the cause of diseases classified elsewhere: Secondary | ICD-10-CM | POA: Insufficient documentation

## 2013-04-12 HISTORY — DX: Major depressive disorder, single episode, unspecified: F32.9

## 2013-04-12 HISTORY — DX: Anxiety disorder, unspecified: F41.9

## 2013-04-12 HISTORY — DX: Mammographic calcification found on diagnostic imaging of breast: R92.1

## 2013-04-12 HISTORY — DX: Depression, unspecified: F32.A

## 2013-04-12 LAB — URINALYSIS, ROUTINE W REFLEX MICROSCOPIC
Glucose, UA: NEGATIVE mg/dL
Ketones, ur: NEGATIVE mg/dL
pH: 6 (ref 5.0–8.0)

## 2013-04-12 LAB — URINE MICROSCOPIC-ADD ON

## 2013-04-12 LAB — WET PREP, GENITAL: Trich, Wet Prep: NONE SEEN

## 2013-04-12 LAB — POCT PREGNANCY, URINE: Preg Test, Ur: NEGATIVE

## 2013-04-12 MED ORDER — METRONIDAZOLE 500 MG PO TABS: 500.0000 mg | ORAL_TABLET | Freq: Two times a day (BID) | ORAL | Status: DC

## 2013-04-12 NOTE — MAU Note (Signed)
Patient states she has a thick white vaginal discharge with itching with a slight smell.

## 2013-04-12 NOTE — MAU Provider Note (Signed)
History     CSN: [id number]  Arrival date and time: 04/12/13 4098   First Provider Initiated Contact with Patient 04/12/13 1045      Chief Complaint  Patient presents with  . Vaginal Discharge   HPI [name] is a 42 y.o. [phone] who presents to MAU today with complaint of vaginal discharge x 1 day. It is white with an odor. She denies irritation, pelvic pain, N/V or fever. Recently found out partner has cheated and was concerned.   OB History   Grav Para Term Preterm Abortions TAB SAB Ect Mult Living   3 3 3       3       Past Medical History  Diagnosis Date  . Breast calcification, right   . Depression   . Anxiety     Past Surgical History  Procedure Laterality Date  . Abdominal surgery    . Bladder surgery      Family History  Problem Relation Age of Onset  . Alcohol abuse Neg Hx   . Arthritis Neg Hx   . Asthma Neg Hx   . Birth defects Neg Hx   . Cancer Neg Hx   . COPD Neg Hx   . Depression Neg Hx   . Diabetes Neg Hx   . Drug abuse Neg Hx   . Early death Neg Hx   . Hearing loss Neg Hx   . Heart disease Neg Hx   . Hyperlipidemia Neg Hx   . Hypertension Neg Hx   . Kidney disease Neg Hx   . Learning disabilities Neg Hx   . Mental illness Neg Hx   . Mental retardation Neg Hx   . Miscarriages / Stillbirths Neg Hx   . Stroke Neg Hx   . Vision loss Neg Hx     History  Substance Use Topics  . Smoking status: Current Every Day Smoker -- 1.50 packs/day  . Smokeless tobacco: Not on file  . Alcohol Use: 1.2 oz/week    2 Cans of beer per week    Allergies:  Allergies  Allergen Reactions  . Penicillins Rash    No prescriptions prior to admission    Review of Systems  Constitutional: Negative for fever, chills and malaise/fatigue.  Gastrointestinal: Negative for nausea, vomiting and abdominal pain.  Genitourinary: Negative for dysuria, urgency and frequency.       Neg - vaginal bleeding + vaginal discharge   Physical Exam   Blood  pressure 119/85, pulse 72, temperature 98.2 F (36.8 C), temperature source Oral, resp. rate 16, height 5\' 4"  (1.626 m), weight 175 lb 3.2 oz (79.47 kg), last menstrual period 03/25/2013, SpO2 100.00%.  Physical Exam  Constitutional: She is oriented to person, place, and time. She appears well-developed and well-nourished. No distress.  HENT:  Head: Normocephalic and atraumatic.  Cardiovascular: Normal rate, regular rhythm and normal heart sounds.   Respiratory: Effort normal and breath sounds normal. No respiratory distress.  GI: Soft. Bowel sounds are normal. She exhibits no distension and no mass. There is no tenderness. There is no rebound and no guarding.  Genitourinary: Uterus normal. Uterus is not enlarged and not tender. Cervix exhibits no motion tenderness, no discharge and no friability. Right adnexum displays no mass and no tenderness. Left adnexum displays no tenderness. Vaginal discharge (small amount of thin white discharge noted) found.  Neurological: She is alert and oriented to person, place, and time.  Skin: Skin is warm and dry. No erythema.  Psychiatric:  She has a normal mood and affect.   Results for orders placed during the hospital encounter of 04/12/13 (from the past 24 hour(s))  URINALYSIS, ROUTINE W REFLEX MICROSCOPIC     Status: Abnormal   Collection Time    04/12/13 10:30 AM      Result Value Range   Color, Urine YELLOW  YELLOW   APPearance CLOUDY (*) CLEAR   Specific Gravity, Urine 1.020  1.005 - 1.030   pH 6.0  5.0 - 8.0   Glucose, UA NEGATIVE  NEGATIVE mg/dL   Hgb urine dipstick TRACE (*) NEGATIVE   Bilirubin Urine NEGATIVE  NEGATIVE   Ketones, ur NEGATIVE  NEGATIVE mg/dL   Protein, ur NEGATIVE  NEGATIVE mg/dL   Urobilinogen, UA 0.2  0.0 - 1.0 mg/dL   Nitrite NEGATIVE  NEGATIVE   Leukocytes, UA MODERATE (*) NEGATIVE  URINE MICROSCOPIC-ADD ON     Status: Abnormal   Collection Time    04/12/13 10:30 AM      Result Value Range   Squamous Epithelial /  LPF FEW (*) RARE   WBC, UA 7-10  <3 WBC/hpf   Bacteria, UA MANY (*) RARE   Urine-Other TRICHOMONAS PRESENT    POCT PREGNANCY, URINE     Status: None   Collection Time    04/12/13 10:39 AM      Result Value Range   Preg Test, Ur NEGATIVE  NEGATIVE  WET PREP, GENITAL     Status: Abnormal   Collection Time    04/12/13 10:52 AM      Result Value Range   Yeast Wet Prep HPF POC NONE SEEN  NONE SEEN   Trich, Wet Prep NONE SEEN  NONE SEEN   Clue Cells Wet Prep HPF POC FEW (*) NONE SEEN   WBC, Wet Prep HPF POC FEW (*) NONE SEEN    MAU Course  Procedures None  MDM Wet prep and GC/Chlamydia today  Assessment and Plan  A: Bacterial vaginosis Trichomonas   P: Discharge home Rx for Flagyl sent to patient's pharmacy GC/Chlamydia pending Patient to follow-up with Dr. Gaynell Face as needed Pateint may return to MAU as needed or if her condition were to change or worsen  Freddi Starr, PA-C  04/12/2013, 10:45 AM

## 2013-04-12 NOTE — ED Notes (Signed)
Pt was given the number for Sabrina w/[facility] program [phone] after speaking w/ Sabrina.  Showing that Mammogram has been scheduled for pt.

## 2013-04-13 ENCOUNTER — Other Ambulatory Visit: Payer: Self-pay | Admitting: *Deleted

## 2013-04-13 LAB — GC/CHLAMYDIA PROBE AMP: GC Probe RNA: NEGATIVE

## 2013-04-14 LAB — URINE CULTURE: Colony Count: >=100000

## 2013-04-18 ENCOUNTER — Encounter (HOSPITAL_COMMUNITY): Payer: Self-pay

## 2013-04-18 ENCOUNTER — Ambulatory Visit (HOSPITAL_COMMUNITY)
Admission: RE | Admit: 2013-04-18 | Discharge: 2013-04-18 | Disposition: A | Discharge status: Home / Self Care | Payer: Self-pay | Source: Ambulatory Visit | Attending: Obstetrics and Gynecology | Admitting: Obstetrics and Gynecology

## 2013-04-18 VITALS — BP 116/78 | Temp 99.3°F | Ht 65.0 in | Wt 175.0 lb

## 2013-04-18 DIAGNOSIS — N898 Other specified noninflammatory disorders of vagina: Secondary | ICD-10-CM

## 2013-04-18 DIAGNOSIS — N63 Unspecified lump in unspecified breast: Secondary | ICD-10-CM | POA: Insufficient documentation

## 2013-04-18 DIAGNOSIS — Z01419 Encounter for gynecological examination (general) (routine) without abnormal findings: Secondary | ICD-10-CM

## 2013-04-18 DIAGNOSIS — N6452 Nipple discharge: Secondary | ICD-10-CM | POA: Insufficient documentation

## 2013-04-18 NOTE — ED Provider Notes (Signed)
History    Jasmin Stephenson with r breast mass and nipple drainage since last night. "blood and pus." painful masses noted about a week prior. No rash. No fever or chills. Reports similar masses L breast which have resolved. Did not follow-up for imaging. No fever or chills. Also "bump on my butt." small lesion which has been there for years but slightly increased in size over past couple weeks and irritating when she sits down.   CSN: [id number]  Arrival date & time 04/10/13  1422   First MD Initiated Contact with Patient 04/10/13 1758      Chief Complaint  Patient presents with  . Abscess    (Consider location/radiation/quality/duration/timing/severity/associated sxs/prior treatment) HPI  Past Medical History  Diagnosis Date  . Breast calcification, right   . Depression   . Anxiety     Past Surgical History  Procedure Laterality Date  . Abdominal surgery    . Bladder surgery      Family History  Problem Relation Age of Onset  . Alcohol abuse Neg Hx   . Arthritis Neg Hx   . Asthma Neg Hx   . Birth defects Neg Hx   . Cancer Neg Hx   . COPD Neg Hx   . Depression Neg Hx   . Diabetes Neg Hx   . Drug abuse Neg Hx   . Early death Neg Hx   . Hearing loss Neg Hx   . Heart disease Neg Hx   . Hyperlipidemia Neg Hx   . Hypertension Neg Hx   . Kidney disease Neg Hx   . Learning disabilities Neg Hx   . Mental illness Neg Hx   . Mental retardation Neg Hx   . Miscarriages / Stillbirths Neg Hx   . Stroke Neg Hx   . Vision loss Neg Hx     History  Substance Use Topics  . Smoking status: Current Every Day Smoker -- 1.50 packs/day  . Smokeless tobacco: Not on file  . Alcohol Use: 1.2 oz/week    2 Cans of beer per week    OB History   Grav Para Term Preterm Abortions TAB SAB Ect Mult Living   3 3 3       3       Review of Systems All systems reviewed and negative, other than as noted in HPI.  Allergies  Penicillins  Home Medications   Current Outpatient Rx  Name  Route   Sig  Dispense  Refill  . metroNIDAZOLE (FLAGYL) 500 MG tablet   Oral   Take 1 tablet (500 mg total) by mouth 2 (two) times daily.   14 tablet   0     BP 133/99  Pulse 62  Temp(Src) 98.9 F (37.2 C) (Oral)  Resp 16  SpO2 100%  LMP 03/25/2013  Physical Exam  Nursing note and vitals reviewed. Constitutional: She appears well-developed and well-nourished. No distress.  HENT:  Head: Normocephalic and atraumatic.  Eyes: Conjunctivae are normal. Right eye exhibits no discharge. Left eye exhibits no discharge.  Neck: Neck supple.  Cardiovascular: Normal rate, regular rhythm and normal heart sounds.  Exam reveals no gallop and no friction rub.   No murmur heard. Pulmonary/Chest: Effort normal and breath sounds normal. No respiratory distress.  R breast with several tender nodular areas. I could not express any nipple discharge. No cellulitic changes. No axillary nodes palpated.   Abdominal: Soft. She exhibits no distension. There is no tenderness.  Musculoskeletal: She exhibits no edema and no  tenderness.  Neurological: She is alert.  Skin: Skin is warm and dry.  "pea" sized subcutaneous mass R buttock. Firm. Minimal tenderness with palpation. No cellulitis.   Psychiatric: She has a normal mood and affect. Her behavior is normal. Thought content normal.    ED Course  INCISION AND DRAINAGE Date/Time: 04/18/2013 12:56 AM Performed by: Raeford Razor Authorized by: Raeford Razor Consent: Verbal consent obtained. Consent given by: patient Patient identity confirmed: verbally with patient, provided demographic data and arm band Type: cyst (sebaceous cyst) Body area: anogenital (R buttock) Anesthesia: local infiltration Local anesthetic: lidocaine 2% with epinephrine Anesthetic total: 1 ml Patient sedated: no Scalpel size: 11 Incision type: single straight Complexity: simple Drainage characteristics: sebaceous. Patient tolerance: Patient tolerated the procedure well with no  immediate complications. Comments: Less than 1 cm incision made over cyst. Capsule excised. Contained sebaceous appearing material. Single simple interrupted suture with 4-0 prolene.    (including critical care time)  Labs Reviewed - No data to display No results found.   1. Sebaceous cyst   2. Breast mass in female   3. Nipple discharge       MDM  Jasmin Stephenson with breast mass and nipple discharge. Not consistent with infection. Pt again instructed that needs imaging/GYN follow-up. Small lesion r buttock consistent with sebaceous cyst. Doesn't seem infected pt c/o irritation when sitting. Excised. Wound care and follow-up for suture removal.         Raeford Razor, MD 04/18/13 [phone]

## 2013-04-18 NOTE — Patient Instructions (Addendum)
Taught patient how to perform BSE. Let her know [facility] will cover Pap smears every 3 years unless has a history of abnormal Pap smears. Referred patient to the [facility] for diagnostic mammogram and possible bilateral breast ultrasound. Appointment scheduled for Thursday, Apr 20, 2013 at 0900. Patient aware of appointment and will be there. Let patient know will follow up with her within the next couple weeks with results. Patient verbalized understanding.

## 2013-04-18 NOTE — Progress Notes (Signed)
Complaints of right breast lump x 1 month that is painful at times rating pain at a 7 out of 10. Patient complained of right nipple discharge that was bloody and pus looking.  Pap Smear:  Pap smear completed today. Per patient last Pap smear was in 2010 and normal. Per patient no history of an abnormal Pap smear. No Pap smear results in EPIC.  Physical exam: Breasts Breasts symmetrical. No skin abnormalities bilateral breasts. No nipple retraction bilateral breasts. No nipple discharge left breast. Yellowish/greenish pus looking discharge from right nipple when  expressed. No lymphadenopathy. Palpated lump within left breast at 3 o'clock 2 cm from the nipple. Palpated lump within the right breast at 3 o'clock 1 cm from the nipple. Patient complained of tenderness when palpated right breast lump. Referred patient to the [facility] for diagnostic mammogram and possible bilateral breast ultrasound. Appointment scheduled for Thursday, Apr 20, 2013 at 0900.    Pelvic/Bimanual   Ext Genitalia No lesions, no swelling and no discharge observed on external genitalia.         Vagina Vagina pink and normal texture. No lesions and thick white cottage cheese looking discharge observed in vagina. Wet prep completed.        Cervix Cervix is present. Cervix pink and of normal texture. Thick white cottage cheese looking discharge observed on cervix.     Uterus Uterus is present and palpable. Uterus in normal position and normal size.        Adnexae Bilateral ovaries present and palpable. No tenderness on palpation.          Rectovaginal No rectal exam completed today since patient had no rectal complaints. No skin abnormalities observed on exam.

## 2013-04-19 ENCOUNTER — Other Ambulatory Visit: Payer: Self-pay | Admitting: Obstetrics and Gynecology

## 2013-04-20 ENCOUNTER — Other Ambulatory Visit: Payer: Self-pay | Admitting: *Deleted

## 2013-04-20 ENCOUNTER — Ambulatory Visit
Admission: RE | Admit: 2013-04-20 | Discharge: 2013-04-20 | Disposition: A | Discharge status: Home / Self Care | Payer: No Typology Code available for payment source | Source: Ambulatory Visit | Attending: Obstetrics and Gynecology | Admitting: Obstetrics and Gynecology

## 2013-04-20 ENCOUNTER — Telehealth (HOSPITAL_COMMUNITY): Payer: Self-pay | Admitting: *Deleted

## 2013-04-20 DIAGNOSIS — N63 Unspecified lump in unspecified breast: Secondary | ICD-10-CM

## 2013-04-20 DIAGNOSIS — N6452 Nipple discharge: Secondary | ICD-10-CM

## 2013-04-20 DIAGNOSIS — B373 Candidiasis of vulva and vagina: Secondary | ICD-10-CM

## 2013-04-20 LAB — WET PREP, GENITAL

## 2013-04-20 MED ORDER — FLUCONAZOLE 150 MG PO TABS: 150.0000 mg | ORAL_TABLET | Freq: Once | ORAL | Status: DC

## 2013-04-20 NOTE — Telephone Encounter (Signed)
Telephoned patient at home # and left message to return call to [facility] 

## 2013-05-01 ENCOUNTER — Other Ambulatory Visit: Payer: Self-pay | Admitting: *Deleted

## 2013-05-03 ENCOUNTER — Telehealth (HOSPITAL_COMMUNITY): Payer: Self-pay | Admitting: *Deleted

## 2013-05-03 ENCOUNTER — Other Ambulatory Visit: Payer: Self-pay | Admitting: *Deleted

## 2013-05-03 DIAGNOSIS — B379 Candidiasis, unspecified: Secondary | ICD-10-CM

## 2013-05-03 MED ORDER — FLUCONAZOLE 150 MG PO TABS: 150.0000 mg | ORAL_TABLET | Freq: Once | ORAL | Status: DC

## 2013-05-03 NOTE — Telephone Encounter (Signed)
Telephoned patient at home # and discussed results of negative pap smear. Next pap smear due in 3 years. Med for yeast called in to pharmacy. Patient voiced understanding.

## 2013-11-28 ENCOUNTER — Emergency Department (HOSPITAL_COMMUNITY)
Admission: EM | Admit: 2013-11-28 | Discharge: 2013-11-28 | Disposition: A | Discharge status: Home / Self Care | Payer: Medicaid Other | Attending: Emergency Medicine | Admitting: Emergency Medicine

## 2013-11-28 ENCOUNTER — Emergency Department (HOSPITAL_COMMUNITY): Payer: Medicaid Other

## 2013-11-28 DIAGNOSIS — M25552 Pain in left hip: Secondary | ICD-10-CM

## 2013-11-28 DIAGNOSIS — R21 Rash and other nonspecific skin eruption: Secondary | ICD-10-CM | POA: Insufficient documentation

## 2013-11-28 DIAGNOSIS — F411 Generalized anxiety disorder: Secondary | ICD-10-CM | POA: Insufficient documentation

## 2013-11-28 DIAGNOSIS — L2989 Other pruritus: Secondary | ICD-10-CM | POA: Insufficient documentation

## 2013-11-28 DIAGNOSIS — L298 Other pruritus: Secondary | ICD-10-CM | POA: Insufficient documentation

## 2013-11-28 DIAGNOSIS — F329 Major depressive disorder, single episode, unspecified: Secondary | ICD-10-CM | POA: Insufficient documentation

## 2013-11-28 DIAGNOSIS — F17200 Nicotine dependence, unspecified, uncomplicated: Secondary | ICD-10-CM | POA: Insufficient documentation

## 2013-11-28 DIAGNOSIS — R238 Other skin changes: Secondary | ICD-10-CM

## 2013-11-28 DIAGNOSIS — Z79899 Other long term (current) drug therapy: Secondary | ICD-10-CM | POA: Insufficient documentation

## 2013-11-28 DIAGNOSIS — F3289 Other specified depressive episodes: Secondary | ICD-10-CM | POA: Insufficient documentation

## 2013-11-28 DIAGNOSIS — Z87448 Personal history of other diseases of urinary system: Secondary | ICD-10-CM | POA: Insufficient documentation

## 2013-11-28 DIAGNOSIS — L988 Other specified disorders of the skin and subcutaneous tissue: Secondary | ICD-10-CM | POA: Insufficient documentation

## 2013-11-28 DIAGNOSIS — M25559 Pain in unspecified hip: Secondary | ICD-10-CM | POA: Insufficient documentation

## 2013-11-28 DIAGNOSIS — Z880 Allergy status to penicillin: Secondary | ICD-10-CM | POA: Insufficient documentation

## 2013-11-28 DIAGNOSIS — R300 Dysuria: Secondary | ICD-10-CM | POA: Insufficient documentation

## 2013-11-28 MED ORDER — IBUPROFEN 800 MG PO TABS
800.0000 mg | ORAL_TABLET | Freq: Once | ORAL | Status: AC
  Administered 2013-11-28: 800 mg via ORAL
  Filled 2013-11-28: qty 1

## 2013-11-28 MED ORDER — IBUPROFEN 800 MG PO TABS: 800.0000 mg | ORAL_TABLET | Freq: Three times a day (TID) | ORAL | Status: DC

## 2013-11-28 NOTE — ED Notes (Signed)
Patient aware of need for urine specimen, will attempt later.

## 2013-11-28 NOTE — Progress Notes (Signed)
   CARE MANAGEMENT ED NOTE 11/28/2013  Patient:  Jasmin Stephenson, Jasmin Stephenson   Account Number:  [id number]  Date Initiated:  11/28/2013  Documentation initiated by:  Edd Arbour  Subjective/Objective Assessment:   42 yr old female medicaid of [place] pt without pcp Pt confirms she does not have a pcp     Subjective/Objective Assessment Detail:   Pt very appreciative of resources provided     Action/Plan:   Pt given a list of [place] medicaid accepting providers from [employer] site   Action/Plan Detail:   Anticipated DC Date:  11/28/2013     Status Recommendation to Physician:   Result of Recommendation:    Other ED Services  Consult Working Plan    DC Planning Services  Other  PCP issues  Outpatient Services - Pt will follow up    Choice offered to / List presented to:            Status of service:  Completed, signed off  ED Comments:   ED Comments Detail:

## 2013-11-28 NOTE — ED Provider Notes (Signed)
CSN: [id number]     Arrival date & time 11/28/13  1114 History   First MD Initiated Contact with Patient 11/28/13 1209     Chief Complaint  Patient presents with  . Skin Problem    Skin lesion on scalp  . Hip Pain   (Consider location/radiation/quality/duration/timing/severity/associated sxs/prior Treatment) HPI Comments: Patient is a 42 yo F presenting to the ED for multiple complaints. Patient's first complaint is a small tender lesion on posterior scalp that she noticed a few days ago. Patient states she recently had her hair unbraided and that's when she first noticed a spot. He states it is itchy and only tender with palpation. She denies any drainage from it. She denies any other spots. Patient's second complaint is left hip pain that has been getting gradually worse over the last several months after a fall. Patient states the pain does not radiate and she describes her pain as a moderate nagging pain that is worse in the morning. She denies any alleviating factors. The patient's last compliant is a concern that she may have a UTI. She states she has had a few episodes of dysuria with urine with a strong smell to it.    Past Medical History  Diagnosis Date  . Breast calcification, right   . Depression   . Anxiety    Past Surgical History  Procedure Laterality Date  . Abdominal surgery    . Bladder surgery     Family History  Problem Relation Age of Onset  . Alcohol abuse Neg Hx   . Arthritis Neg Hx   . Asthma Neg Hx   . Birth defects Neg Hx   . Cancer Neg Hx   . COPD Neg Hx   . Depression Neg Hx   . Drug abuse Neg Hx   . Early death Neg Hx   . Hearing loss Neg Hx   . Heart disease Neg Hx   . Hyperlipidemia Neg Hx   . Hypertension Neg Hx   . Kidney disease Neg Hx   . Learning disabilities Neg Hx   . Mental illness Neg Hx   . Mental retardation Neg Hx   . Miscarriages / Stillbirths Neg Hx   . Stroke Neg Hx   . Vision loss Neg Hx   . Diabetes Father   . Breast  cancer Sister    History  Substance Use Topics  . Smoking status: Current Every Day Smoker -- 1.50 packs/day for 7 years    Types: Cigarettes  . Smokeless tobacco: Not on file  . Alcohol Use: 1.2 oz/week    2 Cans of beer per week   OB History   Grav Para Term Preterm Abortions TAB SAB Ect Mult Living   3 3 3       3      Review of Systems  Constitutional: Negative for fever and chills.  HENT: Negative.   Eyes: Negative.   Respiratory: Negative for shortness of breath.   Cardiovascular: Negative for chest pain.  Gastrointestinal: Negative for nausea, vomiting and abdominal pain.  Genitourinary: Positive for dysuria.  Musculoskeletal: Positive for arthralgias and myalgias.  Skin: Positive for rash.  Neurological: Negative.     Allergies  Penicillins  Home Medications   Current Outpatient Rx  Name  Route  Sig  Dispense  Refill  . ibuprofen (ADVIL,MOTRIN) 800 MG tablet   Oral   Take 1 tablet (800 mg total) by mouth 3 (three) times daily.   21 tablet  0   . OXcarbazepine (TRILEPTAL) 150 MG tablet   Oral   Take 150 mg by mouth 2 (two) times daily.          BP 138/92  Pulse 82  Temp(Src) 98.1 F (36.7 C)  Resp 16  SpO2 98%  LMP 11/27/2013 Physical Exam  Constitutional: She is oriented to person, place, and time. She appears well-developed and well-nourished. No distress.  HENT:  Head: Normocephalic and atraumatic.    Right Ear: External ear normal.  Left Ear: External ear normal.  Nose: Nose normal.  Mouth/Throat: Oropharynx is clear and moist.  0.5 cm flesh colored papule on posterior scalp, not erythematous, non-TTP.   Eyes: Conjunctivae are normal.  Neck: Normal range of motion. Neck supple.  Cardiovascular: Normal rate, regular rhythm, normal heart sounds and intact distal pulses.   Pulmonary/Chest: Effort normal. No respiratory distress.  Abdominal: Soft.  Musculoskeletal: Normal range of motion. She exhibits no edema.  Neurological: She is alert  and oriented to person, place, and time.  Skin: Skin is warm and dry. Rash noted. Rash is papular (one skin papule on posterior scalp). She is not diaphoretic.  Psychiatric: She has a normal mood and affect.    ED Course  Procedures (including critical care time) Medications  ibuprofen (ADVIL,MOTRIN) tablet 800 mg (800 mg Oral Given 11/28/13 1412)    Labs Review Labs Reviewed  URINALYSIS, ROUTINE W REFLEX MICROSCOPIC   Imaging Review Dg Hip Complete Left  11/28/2013   CLINICAL DATA:  Prior fall 1 year ago. Anterior and lateral left hip pain, worsening for 2 months.  EXAM: LEFT HIP - COMPLETE 2+ VIEW  COMPARISON:  07/16/2010 left femur radiographs  FINDINGS: 3 mm ossicle projecting lateral to the superolateral aspect of left acetabulum is unchanged and may be degenerative, congenital, or reflect remote trauma. There is no evidence of acute fracture or dislocation. Bone density appears normal. Joint spaces are maintained.  IMPRESSION: No evidence of acute osseous abnormality or significant arthropathic changes involving the left hip.   Electronically Signed   By: Sebastian Ache   On: 11/28/2013 14:11    EKG Interpretation   None       MDM   1. Left hip pain   2. Benign papule     Afebrile, NAD, non-toxic appearing, AAOx4.   1) Hip pain: Neurovascularly intact. Able to ambulate. Imaging shows no fracture. Directed pt to ice injury, take acetaminophen or ibuprofen for pain, and to elevate and rest the injury when possible.   2) Urinary symptoms: patient is requesting to leave, declining UTI. Given sporadic symptoms do not feel patient needs treatment. Advised recheck if symptoms return or worsen with PCP or return to the ED.  3) Skin papule: No signs of infection. Benign looking lesion, patient has schedule PCP f/u will have them re-evaluated. Advised to keep clean and dry.   Return precautions dicussed. Patient is agreeable to plan. Patient is stable at time of  discharge      Jeannetta Ellis, PA-C 11/28/13 1610

## 2013-11-28 NOTE — ED Notes (Addendum)
Patient reports to ED for tender skin lesion on scalp and pain in her left hip. Pt states that she noticed the lesion when she took the braids out of her hair one month ago, and is uncertain if the lesion was present longer but knows that she never used to have a mass on her head. Pt reports that mass is increasing in size. Patient states that she believes hip pain to be related to a fall on ice last year. On assessment, lesion is skin colored, 0.5 cm in diameter and raised approximately 0.25 cm, with several small tube-like protrusions.

## 2013-11-28 NOTE — ED Notes (Signed)
Pt refuses in and out cath- states that she will be visiting a PCP later this week and prefers to wait until then.

## 2013-11-29 NOTE — ED Provider Notes (Signed)
Medical screening examination/treatment/procedure(s) were conducted as a shared visit with non-physician practitioner(s) and myself.  I personally evaluated the patient during the encounter.  EKG Interpretation   None       Pt c/o left lateral hip pain. No radicular pain or numbness/weakness. No leg swelling. Distal pulses palp. Good rom at hip and knee comfortably. Spine nt.     Suzi Roots, MD 11/29/13 2228

## 2013-12-22 ENCOUNTER — Ambulatory Visit (INDEPENDENT_AMBULATORY_CARE_PROVIDER_SITE_OTHER): Payer: Self-pay | Admitting: General Surgery

## 2014-01-08 ENCOUNTER — Ambulatory Visit (INDEPENDENT_AMBULATORY_CARE_PROVIDER_SITE_OTHER): Payer: Medicaid Other | Admitting: General Surgery

## 2014-01-15 ENCOUNTER — Ambulatory Visit (INDEPENDENT_AMBULATORY_CARE_PROVIDER_SITE_OTHER): Payer: Medicaid Other | Admitting: General Surgery

## 2014-01-17 ENCOUNTER — Encounter (INDEPENDENT_AMBULATORY_CARE_PROVIDER_SITE_OTHER): Payer: Self-pay | Admitting: General Surgery

## 2014-01-17 LAB — PROCEDURE REPORT - SCANNED: PAP SMEAR: NEGATIVE

## 2014-06-22 ENCOUNTER — Other Ambulatory Visit: Payer: Self-pay

## 2014-06-22 DIAGNOSIS — Z1231 Encounter for screening mammogram for malignant neoplasm of breast: Secondary | ICD-10-CM

## 2014-07-23 ENCOUNTER — Ambulatory Visit: Payer: Medicaid Other

## 2014-09-05 ENCOUNTER — Encounter (HOSPITAL_COMMUNITY): Payer: Self-pay

## 2014-09-05 ENCOUNTER — Other Ambulatory Visit (HOSPITAL_COMMUNITY): Payer: Self-pay | Admitting: Obstetrics and Gynecology

## 2014-09-05 ENCOUNTER — Other Ambulatory Visit: Payer: Self-pay | Admitting: Obstetrics and Gynecology

## 2014-09-05 ENCOUNTER — Inpatient Hospital Stay (HOSPITAL_COMMUNITY)
Admission: AD | Admit: 2014-09-05 | Discharge: 2014-09-05 | Disposition: A | Discharge status: Home / Self Care | Payer: Medicaid Other | Source: Ambulatory Visit | Attending: Obstetrics | Admitting: Obstetrics

## 2014-09-05 ENCOUNTER — Other Ambulatory Visit: Payer: Self-pay

## 2014-09-05 DIAGNOSIS — N644 Mastodynia: Secondary | ICD-10-CM | POA: Diagnosis present

## 2014-09-05 DIAGNOSIS — N63 Unspecified lump in unspecified breast: Secondary | ICD-10-CM

## 2014-09-05 DIAGNOSIS — F17200 Nicotine dependence, unspecified, uncomplicated: Secondary | ICD-10-CM | POA: Diagnosis not present

## 2014-09-05 MED ORDER — IBUPROFEN 600 MG PO TABS: 600.0000 mg | ORAL_TABLET | Freq: Four times a day (QID) | ORAL | Status: DC | PRN

## 2014-09-05 NOTE — MAU Note (Signed)
Patient states she has had soreness, swelling and firmness on the right breast for about one week.

## 2014-09-05 NOTE — MAU Provider Note (Signed)
Attestation of Attending Supervision of Advanced Practitioner (PA/CNM/NP): Evaluation and management procedures were performed by the Advanced Practitioner under my supervision and collaboration.  I have reviewed the Advanced Practitioner's note and chart, and I agree with the management and plan.  Jacob Stinson, DO Attending Physician Faculty Practice, [facility]  

## 2014-09-05 NOTE — MAU Provider Note (Signed)
History     CSN: [id number]  Arrival date and time: 09/05/14 1051   First Provider Initiated Contact with Patient 09/05/14 1133      Chief Complaint  Patient presents with  . Breast Pain   HPI  [name] is a 43 y.o. female [phone] who presents with right breast pain. This is not a new complaint; the patient was seen for a mammogram 1 year ago for this same complaint and was told the pain was related to caffeine/ chocolate use. The patient feels that the pain is worse and she does not agree with the diagnoses.  The patient consumes a large amount of tea all day long. She does not drink anything else during the day. The patient was told that she has fibrocystic breasts and this is related to the large amount of caffeine she consumes. She was scheduled for a follow up appointment in August, however missed that appointment due to transportation issues.   OB History   Grav Para Term Preterm Abortions TAB SAB Ect Mult Living   3 3 3       3       Past Medical History  Diagnosis Date  . Breast calcification, right   . Depression   . Anxiety     Past Surgical History  Procedure Laterality Date  . Abdominal surgery    . Bladder surgery      Family History  Problem Relation Age of Onset  . Alcohol abuse Neg Hx   . Arthritis Neg Hx   . Asthma Neg Hx   . Birth defects Neg Hx   . Cancer Neg Hx   . COPD Neg Hx   . Depression Neg Hx   . Drug abuse Neg Hx   . Early death Neg Hx   . Hearing loss Neg Hx   . Heart disease Neg Hx   . Hyperlipidemia Neg Hx   . Hypertension Neg Hx   . Kidney disease Neg Hx   . Learning disabilities Neg Hx   . Mental illness Neg Hx   . Mental retardation Neg Hx   . Miscarriages / Stillbirths Neg Hx   . Stroke Neg Hx   . Vision loss Neg Hx   . Diabetes Father   . Breast cancer Sister     History  Substance Use Topics  . Smoking status: Current Every Day Smoker -- 1.50 packs/day for 7 years    Types: Cigarettes  . Smokeless tobacco:  Not on file  . Alcohol Use: 1.2 oz/week    2 Cans of beer per week    Allergies:  Allergies  Allergen Reactions  . Penicillins Rash    Prescriptions prior to admission  Medication Sig Dispense Refill  . ibuprofen (ADVIL,MOTRIN) 800 MG tablet Take 1 tablet (800 mg total) by mouth 3 (three) times daily.  21 tablet  0  . OXcarbazepine (TRILEPTAL) 150 MG tablet Take 150 mg by mouth 2 (two) times daily.       No results found for this or any previous visit (from the past 48 hour(s)).  Review of Systems  Constitutional: Negative for fever and chills.  Skin:       Right breast pain, tenderness    Physical Exam   Blood pressure 131/88, pulse 77, temperature 98.8 F (37.1 C), temperature source Oral, resp. rate 16, height 5\' 4"  (1.626 m), weight 78.926 kg (174 lb), last menstrual period 08/28/2014, SpO2 100.00%.  Physical Exam  Constitutional: She is  oriented to person, place, and time. She appears well-developed and well-nourished. No distress.  HENT:  Head: Normocephalic.  Eyes: Pupils are equal, round, and reactive to light.  Neck: Neck supple.  Respiratory: Effort normal. Right breast exhibits mass and tenderness. Right breast exhibits no inverted nipple and no nipple discharge. Breasts are symmetrical.    GI: Soft.  Musculoskeletal: Normal range of motion.  Neurological: She is alert and oriented to person, place, and time.  Skin: Skin is warm. She is not diaphoretic.  Psychiatric: Her behavior is normal.    MAU Course  Procedures None  MDM Breast exam   Assessment and Plan   A: 1. Breast lump   2. Breast pain    P: Discharge home in stable condition Referral to the breast center  RX: ibuprofen    Darrelyn Hillock Malichi Palardy, NP  09/05/2014, 11:34 AM

## 2014-09-10 ENCOUNTER — Encounter (HOSPITAL_COMMUNITY): Payer: Medicaid Other | Admitting: Anesthesiology

## 2014-09-10 ENCOUNTER — Observation Stay (HOSPITAL_COMMUNITY): Payer: Medicaid Other | Admitting: Anesthesiology

## 2014-09-10 ENCOUNTER — Inpatient Hospital Stay (HOSPITAL_COMMUNITY)
Admission: AD | Admit: 2014-09-10 | Discharge: 2014-09-12 | DRG: 601 | Disposition: A | Discharge status: Home / Self Care | Payer: Medicaid Other | Source: Ambulatory Visit | Attending: General Surgery | Admitting: General Surgery

## 2014-09-10 ENCOUNTER — Encounter (HOSPITAL_COMMUNITY): Payer: Self-pay | Admitting: *Deleted

## 2014-09-10 ENCOUNTER — Encounter (HOSPITAL_COMMUNITY): Admission: AD | Disposition: A | Discharge status: Home / Self Care | Payer: Self-pay | Source: Ambulatory Visit

## 2014-09-10 DIAGNOSIS — N63 Unspecified lump in unspecified breast: Secondary | ICD-10-CM | POA: Diagnosis present

## 2014-09-10 DIAGNOSIS — F3289 Other specified depressive episodes: Secondary | ICD-10-CM | POA: Diagnosis present

## 2014-09-10 DIAGNOSIS — N61 Mastitis without abscess: Principal | ICD-10-CM | POA: Diagnosis present

## 2014-09-10 DIAGNOSIS — N611 Abscess of the breast and nipple: Secondary | ICD-10-CM | POA: Diagnosis present

## 2014-09-10 DIAGNOSIS — F329 Major depressive disorder, single episode, unspecified: Secondary | ICD-10-CM | POA: Diagnosis present

## 2014-09-10 DIAGNOSIS — E119 Type 2 diabetes mellitus without complications: Secondary | ICD-10-CM | POA: Diagnosis present

## 2014-09-10 DIAGNOSIS — Z880 Allergy status to penicillin: Secondary | ICD-10-CM

## 2014-09-10 DIAGNOSIS — Z87891 Personal history of nicotine dependence: Secondary | ICD-10-CM

## 2014-09-10 HISTORY — PX: IRRIGATION AND DEBRIDEMENT ABSCESS: SHX5252

## 2014-09-10 LAB — CBC WITH DIFFERENTIAL/PLATELET
BASOS ABS: 0 10*3/uL (ref 0.0–0.1)
Basophils Relative: 0 % (ref 0–1)
EOS PCT: 1 % (ref 0–5)
Eosinophils Absolute: 0.1 10*3/uL (ref 0.0–0.7)
HCT: 40.9 % (ref 36.0–46.0)
Hemoglobin: 14.3 g/dL (ref 12.0–15.0)
LYMPHS ABS: 2.6 10*3/uL (ref 0.7–4.0)
Lymphocytes Relative: 24 % (ref 12–46)
MCH: 31.7 pg (ref 26.0–34.0)
MCHC: 35 g/dL (ref 30.0–36.0)
MCV: 90.7 fL (ref 78.0–100.0)
Monocytes Absolute: 1.1 10*3/uL — ABNORMAL HIGH (ref 0.1–1.0)
Monocytes Relative: 10 % (ref 3–12)
NEUTROS PCT: 65 % (ref 43–77)
Neutro Abs: 6.9 10*3/uL (ref 1.7–7.7)
PLATELETS: 358 10*3/uL (ref 150–400)
RBC: 4.51 MIL/uL (ref 3.87–5.11)
RDW: 14 % (ref 11.5–15.5)
WBC: 10.7 10*3/uL — ABNORMAL HIGH (ref 4.0–10.5)

## 2014-09-10 LAB — HEMOGLOBIN A1C
Hgb A1c MFr Bld: 5.4 % (ref <5.7)
MEAN PLASMA GLUCOSE: 108 mg/dL (ref <117)

## 2014-09-10 LAB — URINALYSIS, ROUTINE W REFLEX MICROSCOPIC
Glucose, UA: NEGATIVE mg/dL
Ketones, ur: 15 mg/dL — AB
NITRITE: NEGATIVE
Protein, ur: NEGATIVE mg/dL
Specific Gravity, Urine: 1.01 (ref 1.005–1.030)
UROBILINOGEN UA: 0.2 mg/dL (ref 0.0–1.0)
pH: 6 (ref 5.0–8.0)

## 2014-09-10 LAB — BASIC METABOLIC PANEL
Anion gap: 15 (ref 5–15)
BUN: 7 mg/dL (ref 6–23)
CHLORIDE: 102 meq/L (ref 96–112)
CO2: 22 mEq/L (ref 19–32)
Calcium: 8.9 mg/dL (ref 8.4–10.5)
Creatinine, Ser: 0.8 mg/dL (ref 0.50–1.10)
GFR calc Af Amer: >90 mL/min (ref >90)
GLUCOSE: 83 mg/dL (ref 70–99)
Potassium: 3.9 mEq/L (ref 3.7–5.3)
Sodium: 139 mEq/L (ref 137–147)

## 2014-09-10 LAB — URINE MICROSCOPIC-ADD ON

## 2014-09-10 LAB — POCT PREGNANCY, URINE: PREG TEST UR: NEGATIVE

## 2014-09-10 LAB — PREGNANCY, URINE: Preg Test, Ur: NEGATIVE

## 2014-09-10 SURGERY — IRRIGATION AND DEBRIDEMENT ABSCESS
Anesthesia: General | Site: Breast | Laterality: Right

## 2014-09-10 MED ORDER — CLINDAMYCIN PHOSPHATE 900 MG/50ML IV SOLN
INTRAVENOUS | Status: AC
  Filled 2014-09-10: qty 50

## 2014-09-10 MED ORDER — BUPIVACAINE-EPINEPHRINE (PF) 0.25% -1:200000 IJ SOLN
INTRAMUSCULAR | Status: AC
  Filled 2014-09-10: qty 30

## 2014-09-10 MED ORDER — PROMETHAZINE HCL 25 MG/ML IJ SOLN: 6.2500 mg | INTRAMUSCULAR | Status: DC | PRN

## 2014-09-10 MED ORDER — CLINDAMYCIN PHOSPHATE 900 MG/50ML IV SOLN
900.0000 mg | Freq: Once | INTRAVENOUS | Status: AC
  Administered 2014-09-10: 900 mg via INTRAVENOUS

## 2014-09-10 MED ORDER — FENTANYL CITRATE 0.05 MG/ML IJ SOLN
INTRAMUSCULAR | Status: AC
  Filled 2014-09-10: qty 2

## 2014-09-10 MED ORDER — KETOROLAC TROMETHAMINE 60 MG/2ML IM SOLN
60.0000 mg | Freq: Once | INTRAMUSCULAR | Status: AC
  Administered 2014-09-10: 60 mg via INTRAMUSCULAR
  Filled 2014-09-10: qty 2

## 2014-09-10 MED ORDER — LIDOCAINE HCL (CARDIAC) 20 MG/ML IV SOLN
INTRAVENOUS | Status: AC
  Filled 2014-09-10: qty 5

## 2014-09-10 MED ORDER — ENOXAPARIN SODIUM 40 MG/0.4ML ~~LOC~~ SOLN
40.0000 mg | SUBCUTANEOUS | Status: DC
  Administered 2014-09-11 – 2014-09-12 (×2): 40 mg via SUBCUTANEOUS
  Filled 2014-09-10 (×2): qty 0.4

## 2014-09-10 MED ORDER — ALBUTEROL SULFATE HFA 108 (90 BASE) MCG/ACT IN AERS: 2.0000 | INHALATION_SPRAY | Freq: Four times a day (QID) | RESPIRATORY_TRACT | Status: DC | PRN

## 2014-09-10 MED ORDER — HYDROCODONE-ACETAMINOPHEN 5-325 MG PO TABS
1.0000 | ORAL_TABLET | ORAL | Status: DC | PRN
  Administered 2014-09-11: 1 via ORAL
  Administered 2014-09-11 – 2014-09-12 (×2): 2 via ORAL
  Filled 2014-09-10: qty 2
  Filled 2014-09-10 (×2): qty 1
  Filled 2014-09-10: qty 2

## 2014-09-10 MED ORDER — CLINDAMYCIN PHOSPHATE 900 MG/50ML IV SOLN
900.0000 mg | Freq: Three times a day (TID) | INTRAVENOUS | Status: DC
  Administered 2014-09-10 – 2014-09-12 (×5): 900 mg via INTRAVENOUS
  Filled 2014-09-10 (×6): qty 50

## 2014-09-10 MED ORDER — LACTATED RINGERS IV SOLN
INTRAVENOUS | Status: DC
  Administered 2014-09-10 – 2014-09-11 (×2): via INTRAVENOUS

## 2014-09-10 MED ORDER — HYDROMORPHONE HCL 1 MG/ML IJ SOLN
1.0000 mg | Freq: Once | INTRAMUSCULAR | Status: AC
  Administered 2014-09-10: 1 mg via INTRAVENOUS
  Filled 2014-09-10: qty 1

## 2014-09-10 MED ORDER — MORPHINE SULFATE 2 MG/ML IJ SOLN: 2.0000 mg | INTRAMUSCULAR | Status: DC | PRN

## 2014-09-10 MED ORDER — OXCARBAZEPINE 300 MG PO TABS
300.0000 mg | ORAL_TABLET | Freq: Two times a day (BID) | ORAL | Status: DC
  Administered 2014-09-10 – 2014-09-12 (×4): 300 mg via ORAL
  Filled 2014-09-10 (×7): qty 1

## 2014-09-10 MED ORDER — PROPOFOL 10 MG/ML IV BOLUS
INTRAVENOUS | Status: DC | PRN
  Administered 2014-09-10: 180 mg via INTRAVENOUS

## 2014-09-10 MED ORDER — LACTATED RINGERS IV SOLN
INTRAVENOUS | Status: DC
  Administered 2014-09-10 (×2): via INTRAVENOUS

## 2014-09-10 MED ORDER — FENTANYL CITRATE 0.05 MG/ML IJ SOLN
INTRAMUSCULAR | Status: DC | PRN
  Administered 2014-09-10 (×2): 50 ug via INTRAVENOUS

## 2014-09-10 MED ORDER — MORPHINE SULFATE 2 MG/ML IJ SOLN
2.0000 mg | INTRAMUSCULAR | Status: DC | PRN
Start: 2014-09-10 — End: 2014-09-12
  Administered 2014-09-10: 2 mg via INTRAVENOUS
  Administered 2014-09-11 (×2): 4 mg via INTRAVENOUS
  Filled 2014-09-10: qty 1
  Filled 2014-09-10 (×2): qty 2

## 2014-09-10 MED ORDER — PNEUMOCOCCAL VAC POLYVALENT 25 MCG/0.5ML IJ INJ
0.5000 mL | INJECTION | INTRAMUSCULAR | Status: AC
  Administered 2014-09-12: 0.5 mL via INTRAMUSCULAR
  Filled 2014-09-10 (×3): qty 0.5

## 2014-09-10 MED ORDER — ONDANSETRON HCL 4 MG/2ML IJ SOLN
INTRAMUSCULAR | Status: AC
  Filled 2014-09-10: qty 2

## 2014-09-10 MED ORDER — CLINDAMYCIN PHOSPHATE 600 MG/50ML IV SOLN: 600.0000 mg | Freq: Four times a day (QID) | INTRAVENOUS | Status: DC

## 2014-09-10 MED ORDER — FENTANYL CITRATE 0.05 MG/ML IJ SOLN: 25.0000 ug | INTRAMUSCULAR | Status: DC | PRN

## 2014-09-10 MED ORDER — ACETAMINOPHEN 325 MG PO TABS: 650.0000 mg | ORAL_TABLET | Freq: Four times a day (QID) | ORAL | Status: DC | PRN

## 2014-09-10 MED ORDER — MIDAZOLAM HCL 2 MG/2ML IJ SOLN
INTRAMUSCULAR | Status: AC
  Filled 2014-09-10: qty 2

## 2014-09-10 MED ORDER — 0.9 % SODIUM CHLORIDE (POUR BTL) OPTIME
TOPICAL | Status: DC | PRN
  Administered 2014-09-10: 1000 mL

## 2014-09-10 MED ORDER — INFLUENZA VAC SPLIT QUAD 0.5 ML IM SUSY
0.5000 mL | PREFILLED_SYRINGE | INTRAMUSCULAR | Status: AC
  Administered 2014-09-12: 0.5 mL via INTRAMUSCULAR
  Filled 2014-09-10 (×3): qty 0.5

## 2014-09-10 MED ORDER — ACETAMINOPHEN 650 MG RE SUPP: 650.0000 mg | Freq: Four times a day (QID) | RECTAL | Status: DC | PRN

## 2014-09-10 MED ORDER — MIDAZOLAM HCL 5 MG/5ML IJ SOLN
INTRAMUSCULAR | Status: DC | PRN
  Administered 2014-09-10: 2 mg via INTRAVENOUS

## 2014-09-10 MED ORDER — ONDANSETRON HCL 4 MG/2ML IJ SOLN: 4.0000 mg | Freq: Four times a day (QID) | INTRAMUSCULAR | Status: DC | PRN

## 2014-09-10 MED ORDER — PROPOFOL 10 MG/ML IV BOLUS
INTRAVENOUS | Status: AC
  Filled 2014-09-10: qty 20

## 2014-09-10 MED ORDER — ENOXAPARIN SODIUM 40 MG/0.4ML ~~LOC~~ SOLN: 40.0000 mg | SUBCUTANEOUS | Status: DC

## 2014-09-10 MED ORDER — LIDOCAINE HCL (CARDIAC) 20 MG/ML IV SOLN
INTRAVENOUS | Status: DC | PRN
Start: 2014-09-10 — End: 2014-09-10
  Administered 2014-09-10: 50 mg via INTRAVENOUS

## 2014-09-10 MED ORDER — ONDANSETRON HCL 4 MG/2ML IJ SOLN
INTRAMUSCULAR | Status: DC | PRN
  Administered 2014-09-10: 4 mg via INTRAVENOUS

## 2014-09-10 MED ORDER — BUPIVACAINE-EPINEPHRINE 0.25% -1:200000 IJ SOLN
INTRAMUSCULAR | Status: DC | PRN
  Administered 2014-09-10: 5 mL

## 2014-09-10 SURGICAL SUPPLY — 40 items
BLADE HEX COATED 2.75 (ELECTRODE) ×3 IMPLANT
BLADE SURG SZ10 CARB STEEL (BLADE) ×3 IMPLANT
CANISTER SUCTION 2500CC (MISCELLANEOUS) ×3 IMPLANT
DECANTER SPIKE VIAL GLASS SM (MISCELLANEOUS) IMPLANT
DERMABOND ADVANCED (GAUZE/BANDAGES/DRESSINGS)
DERMABOND ADVANCED .7 DNX12 (GAUZE/BANDAGES/DRESSINGS) IMPLANT
DRAPE LAPAROSCOPIC ABDOMINAL (DRAPES) ×3 IMPLANT
DRAPE LAPAROTOMY T 102X78X121 (DRAPES) IMPLANT
DRAPE LAPAROTOMY TRNSV 102X78 (DRAPE) IMPLANT
DRAPE SHEET LG 3/4 BI-LAMINATE (DRAPES) IMPLANT
DRSG PAD ABDOMINAL 8X10 ST (GAUZE/BANDAGES/DRESSINGS) ×3 IMPLANT
ELECT REM PT RETURN 9FT ADLT (ELECTROSURGICAL) ×3
ELECTRODE REM PT RTRN 9FT ADLT (ELECTROSURGICAL) ×1 IMPLANT
GAUZE PACKING IODOFORM 1/4X15 (GAUZE/BANDAGES/DRESSINGS) ×3 IMPLANT
GAUZE SPONGE 4X4 12PLY STRL (GAUZE/BANDAGES/DRESSINGS) ×3 IMPLANT
GLOVE BIO SURGEON STRL SZ7 (GLOVE) ×9 IMPLANT
GLOVE BIO SURGEON STRL SZ7.5 (GLOVE) ×9 IMPLANT
GLOVE BIOGEL M STRL SZ7.5 (GLOVE) IMPLANT
GLOVE BIOGEL PI IND STRL 7.0 (GLOVE) ×1 IMPLANT
GLOVE BIOGEL PI INDICATOR 7.0 (GLOVE) ×2
GLOVE INDICATOR 8.0 STRL GRN (GLOVE) ×9 IMPLANT
GOWN STRL REUS W/ TWL XL LVL3 (GOWN DISPOSABLE) ×1 IMPLANT
GOWN STRL REUS W/TWL LRG LVL3 (GOWN DISPOSABLE) ×6 IMPLANT
GOWN STRL REUS W/TWL XL LVL3 (GOWN DISPOSABLE) ×5 IMPLANT
KIT BASIN OR (CUSTOM PROCEDURE TRAY) ×3 IMPLANT
MARKER SKIN DUAL TIP RULER LAB (MISCELLANEOUS) ×3 IMPLANT
NEEDLE HYPO 25X1 1.5 SAFETY (NEEDLE) ×3 IMPLANT
NS IRRIG 1000ML POUR BTL (IV SOLUTION) ×3 IMPLANT
PACK BASIC VI WITH GOWN DISP (CUSTOM PROCEDURE TRAY) ×3 IMPLANT
PENCIL BUTTON HOLSTER BLD 10FT (ELECTRODE) ×3 IMPLANT
SPONGE LAP 18X18 X RAY DECT (DISPOSABLE) ×3 IMPLANT
SPONGE LAP 4X18 X RAY DECT (DISPOSABLE) IMPLANT
STAPLER VISISTAT 35W (STAPLE) IMPLANT
SUT MNCRL AB 4-0 PS2 18 (SUTURE) IMPLANT
SUT VIC AB 3-0 SH 18 (SUTURE) IMPLANT
SYR BULB IRRIGATION 50ML (SYRINGE) ×3 IMPLANT
SYR CONTROL 10ML LL (SYRINGE) ×3 IMPLANT
TAPE CLOTH SURG 4X10 WHT LF (GAUZE/BANDAGES/DRESSINGS) ×3 IMPLANT
TOWEL OR 17X26 10 PK STRL BLUE (TOWEL DISPOSABLE) ×3 IMPLANT
YANKAUER SUCT BULB TIP 10FT TU (MISCELLANEOUS) ×3 IMPLANT

## 2014-09-10 NOTE — ED Provider Notes (Signed)
Medical screening examination/treatment/procedure(s) were performed by non-physician practitioner and as supervising physician I was immediately available for consultation/collaboration.   EKG Interpretation None       Jasmin Stephenson. Alvino Chapel, MD 09/10/14 1535

## 2014-09-10 NOTE — ED Notes (Signed)
PER CARELINK - pt from MAU with c/o breast mass, seen and has been worked up for same, sent here for further eval.

## 2014-09-10 NOTE — ED Notes (Signed)
Bed: XV40 Expected date:  Expected time:  Means of arrival:  Comments: Pt from MAU- breast mass

## 2014-09-10 NOTE — MAU Provider Note (Signed)
Attestation of Attending Supervision of Advanced Practitioner (PA/CNM/NP): Evaluation and management procedures were performed by the Advanced Practitioner under my supervision and collaboration.  I have reviewed the Advanced Practitioner's note and chart, and I agree with the management and plan.  Jacob Stinson, DO Attending Physician Faculty Practice, [facility]  

## 2014-09-10 NOTE — ED Notes (Signed)
Pt to OR.

## 2014-09-10 NOTE — Op Note (Signed)
09/10/2014  3:28 PM  PATIENT:  Jasmin Stephenson  43 y.o. female  PRE-OPERATIVE DIAGNOSIS:  right breast abcess  POST-OPERATIVE DIAGNOSIS:  right posterior nipple abcess  PROCEDURE:  Procedure(s): INCISION AND DRAINAGE OF  RIGHT BREAST  POSTERIOR NIPPLE ABSCESS (3cm x 5cm)  SURGEON:  Surgeon(s): Gayland Curry, MD  ASSISTANTS: none   ANESTHESIA:   general  DRAINS: none   LOCAL MEDICATIONS USED:  MARCAINE     SPECIMEN:  Source of Specimen:  purulent fluid posterior nipple  DISPOSITION OF SPECIMEN:  micro  COUNTS:  YES  INDICATION FOR PROCEDURE: Queena Monrreal is a 43 year old female with a history of tobacco use, anxiety/dpression who presents from [facility] with a right breast abscess. The patient reports her symptoms initially started about 3 weeks ago at which time she noticed a pimple. Overtime, this increased in size and became more painful. She denies nipple discharge, drainage, fever, chills or sweats. Modifying factors include; warm compress without out help. Moderate in severity. No aggravating or alleviating factors. Symptoms are constant. Denies previous symptoms. She reports +polys, no known history of diabetes mellitus.  PROCEDURE: After obtaining informed consent and marking the operative site in the holding area the patient was taken to operating room 6 at [facility] and placed supine on the operating room table. General LMA anesthesia was established. Her right breast and chest wall was prepped and draped in the usual standard surgical fashion with chlor prep. Sequential compression devices were placed. She received clindamycin prior to skin incision. Surgical tunnels were formed. On palpation of her breasts now but she was asleep the majority abscess was just posterior to her nipple. There was little to no induration at the edge of the nipple areolar complex. Local was infiltrated about 2 cm above her nipple. The nipple was quite swollen and  fluctuant. A curvilinear incision was made for approximately 3 cm. I then divided down through the subcutaneous tissue for about 2 inches and entered a large purulent cavity. It tunneled inferiorly just behind her nipple. Some of her skin of her nipple was very attenuated. Approximately 30 cc of purulent material was evacuated. Cultures were obtained. The cavity was irrigated. Hemostasis was achieved. Quarter-inch iodoform gauze was packed into the wound. We then placed dry gauze and AVD over the wound. She was extubated and taken to the recovery room in stable condition. All needle, instrument, and sponge counts were correct x2. There were no immediate complications.  PLAN OF CARE: Admit for overnight observation  PATIENT DISPOSITION:  PACU - hemodynamically stable.   Delay start of Pharmacological VTE agent (>24hrs) due to surgical blood loss or risk of bleeding:  no  Leighton Ruff. Redmond Pulling, MD, FACS General, Bariatric, & Minimally Invasive Surgery [facility], [place]

## 2014-09-10 NOTE — MAU Provider Note (Signed)
History     CSN: [id number]  Arrival date and time: 09/10/14 0935   None     Chief Complaint  Patient presents with  . Breast Mass   HPI  [name] is a 43 y.o. female who presents to MAU with breast mass and severe right breast pain. She was seen a week ago for the same complaint and I referred her to the breast center. She called the breast center to schedule her appointment and they told her that it needed to approved by medicaid; they offered her an appointment however the patient refused because she was unable to pay out of pocket for the imaging. The patient was given RX for Ibupfrofen the last time she was here, she has not taken anything for pain today.  The pain is in the same location; her right breast, the pain is worse and is now keeping her up at night.  The patient confessed to drinking 8-10 glass of tea per day and was told that she has fibrocystic breast syndrome; she was encouraged to stop caffeine intake. She states that she stopped caffeine since the last time she was seen here, however the symptoms continued to worsen. She denies fever.   OB History   Grav Para Term Preterm Abortions TAB SAB Ect Mult Living   3 3 3       3       Past Medical History  Diagnosis Date  . Breast calcification, right   . Depression   . Anxiety     Past Surgical History  Procedure Laterality Date  . Abdominal surgery    . Bladder surgery      Family History  Problem Relation Age of Onset  . Alcohol abuse Neg Hx   . Arthritis Neg Hx   . Asthma Neg Hx   . Birth defects Neg Hx   . Cancer Neg Hx   . COPD Neg Hx   . Depression Neg Hx   . Drug abuse Neg Hx   . Early death Neg Hx   . Hearing loss Neg Hx   . Heart disease Neg Hx   . Hyperlipidemia Neg Hx   . Hypertension Neg Hx   . Kidney disease Neg Hx   . Learning disabilities Neg Hx   . Mental illness Neg Hx   . Mental retardation Neg Hx   . Miscarriages / Stillbirths Neg Hx   . Stroke Neg Hx   . Vision  loss Neg Hx   . Diabetes Father   . Breast cancer Sister     History  Substance Use Topics  . Smoking status: Current Every Day Smoker -- 1.50 packs/day for 7 years    Types: Cigarettes  . Smokeless tobacco: Never Used  . Alcohol Use: 1.2 oz/week    2 Cans of beer per week    Allergies:  Allergies  Allergen Reactions  . Penicillins Rash    Prescriptions prior to admission  Medication Sig Dispense Refill  . albuterol (PROVENTIL HFA;VENTOLIN HFA) 108 (90 BASE) MCG/ACT inhaler Inhale 2 puffs into the lungs every 6 (six) hours as needed for wheezing or shortness of breath.      . flintstones complete (FLINTSTONES) 60 MG chewable tablet Chew 1 tablet by mouth daily.      Marland Kitchen ibuprofen (ADVIL,MOTRIN) 600 MG tablet Take 1 tablet (600 mg total) by mouth every 6 (six) hours as needed.  30 tablet  0  . Oxcarbazepine (TRILEPTAL) 300 MG tablet Take  300 mg by mouth 2 (two) times daily.       Results for orders placed during the hospital encounter of 09/10/14 (from the past 48 hour(s))  URINALYSIS, ROUTINE W REFLEX MICROSCOPIC     Status: Abnormal   Collection Time    09/10/14 10:30 AM      Result Value Ref Range   Color, Urine YELLOW  YELLOW   APPearance CLOUDY (*) CLEAR   Specific Gravity, Urine 1.010  1.005 - 1.030   pH 6.0  5.0 - 8.0   Glucose, UA NEGATIVE  NEGATIVE mg/dL   Hgb urine dipstick TRACE (*) NEGATIVE   Bilirubin Urine SMALL (*) NEGATIVE   Ketones, ur 15 (*) NEGATIVE mg/dL   Protein, ur NEGATIVE  NEGATIVE mg/dL   Urobilinogen, UA 0.2  0.0 - 1.0 mg/dL   Nitrite NEGATIVE  NEGATIVE   Leukocytes, UA TRACE (*) NEGATIVE  PREGNANCY, URINE     Status: None   Collection Time    09/10/14 10:30 AM      Result Value Ref Range   Preg Test, Ur NEGATIVE  NEGATIVE   Comment:            THE SENSITIVITY OF THIS     METHODOLOGY IS >20 mIU/mL.  CBC WITH DIFFERENTIAL     Status: Abnormal   Collection Time    09/10/14 10:30 AM      Result Value Ref Range   WBC 10.7 (*) 4.0 - 10.5 K/uL    RBC 4.51  3.87 - 5.11 MIL/uL   Hemoglobin 14.3  12.0 - 15.0 g/dL   HCT 40.9  36.0 - 46.0 %   MCV 90.7  78.0 - 100.0 fL   MCH 31.7  26.0 - 34.0 pg   MCHC 35.0  30.0 - 36.0 g/dL   RDW 14.0  11.5 - 15.5 %   Platelets 358  150 - 400 K/uL   Neutrophils Relative % 65  43 - 77 %   Neutro Abs 6.9  1.7 - 7.7 K/uL   Lymphocytes Relative 24  12 - 46 %   Lymphs Abs 2.6  0.7 - 4.0 K/uL   Monocytes Relative 10  3 - 12 %   Monocytes Absolute 1.1 (*) 0.1 - 1.0 K/uL   Eosinophils Relative 1  0 - 5 %   Eosinophils Absolute 0.1  0.0 - 0.7 K/uL   Basophils Relative 0  0 - 1 %   Basophils Absolute 0.0  0.0 - 0.1 K/uL  URINE MICROSCOPIC-ADD ON     Status: Abnormal   Collection Time    09/10/14 10:30 AM      Result Value Ref Range   Squamous Epithelial / LPF MANY (*) RARE   WBC, UA 0-2  <3 WBC/hpf   Bacteria, UA FEW (*) RARE   Urine-Other MUCOUS PRESENT    POCT PREGNANCY, URINE     Status: None   Collection Time    09/10/14 10:34 AM      Result Value Ref Range   Preg Test, Ur NEGATIVE  NEGATIVE   Comment:            THE SENSITIVITY OF THIS     METHODOLOGY IS >24 mIU/mL   Review of Systems  Constitutional: Negative for fever and chills.  Skin:       + right breast pain    Physical Exam   Blood pressure 136/92, pulse 80, temperature 99 F (37.2 C), temperature source Oral, resp. rate 18, last menstrual period  08/28/2014.  Physical Exam  Constitutional: She is oriented to person, place, and time. She appears well-developed and well-nourished. No distress.  HENT:  Head: Normocephalic.  Eyes: Pupils are equal, round, and reactive to light.  Neck: Normal range of motion.  Respiratory: Right breast exhibits mass, skin change and tenderness. Right breast exhibits no inverted nipple and no nipple discharge. Breasts are asymmetrical.    Musculoskeletal: Normal range of motion.  Neurological: She is alert and oriented to person, place, and time.  Skin: Skin is warm. She is not diaphoretic.      MAU Course  Procedures None  MDM  Toradol 60 mg IM  Consulted with Dr. Nehemiah Settle and asked to assess patient in MAU.  CBC with Diff.  UA Upt- negative  Dr.Stinson to consult general surgery for further plan of care. Transfer to [facility] long ED per Dr. Nehemiah Settle; Dr. Alvino Chapel is the accepting physician.  LR IVF Dilaudid 1 mg IV Patient made aware of plan of care and is agreeable   Assessment and Plan   A:  Breast abscess   P:  IV pain medication Consult to general surgery Pt to be transferred via carelink to [place], NP 09/10/2014 11:50 AM

## 2014-09-10 NOTE — ED Notes (Signed)
Surgery at bedside.

## 2014-09-10 NOTE — Anesthesia Preprocedure Evaluation (Signed)
Anesthesia Evaluation  Patient identified by MRN, date of birth, ID band Patient awake    Reviewed: Allergy & Precautions, H&P , NPO status , Patient's Chart, lab work & pertinent test results  Airway Mallampati: II TM Distance: >3 FB Neck ROM: Full    Dental no notable dental hx.    Pulmonary Current Smoker,  breath sounds clear to auscultation  Pulmonary exam normal       Cardiovascular negative cardio ROS  Rhythm:Regular Rate:Normal     Neuro/Psych Anxiety negative neurological ROS     GI/Hepatic negative GI ROS, Neg liver ROS,   Endo/Other  negative endocrine ROS  Renal/GU negative Renal ROS  negative genitourinary   Musculoskeletal negative musculoskeletal ROS (+)   Abdominal   Peds negative pediatric ROS (+)  Hematology negative hematology ROS (+)   Anesthesia Other Findings   Reproductive/Obstetrics negative OB ROS                           Anesthesia Physical Anesthesia Plan  ASA: II  Anesthesia Plan: General   Post-op Pain Management:    Induction: Intravenous  Airway Management Planned: LMA  Additional Equipment:   Intra-op Plan:   Post-operative Plan:   Informed Consent: I have reviewed the patients History and Physical, chart, labs and discussed the procedure including the risks, benefits and alternatives for the proposed anesthesia with the patient or authorized representative who has indicated his/her understanding and acceptance.   Dental advisory given  Plan Discussed with: CRNA and Surgeon  Anesthesia Plan Comments:         Anesthesia Quick Evaluation

## 2014-09-10 NOTE — Anesthesia Postprocedure Evaluation (Signed)
  Anesthesia Post-op Note  Patient: Jasmin Stephenson  Procedure(s) Performed: Procedure(s) (LRB): IRRIGATION AND DEBRIDEMENT RIGHT BREAST  ABSCESS (Right)  Patient Location: PACU  Anesthesia Type: General  Level of Consciousness: awake and alert   Airway and Oxygen Therapy: Patient Spontanous Breathing  Post-op Pain: mild  Post-op Assessment: Post-op Vital signs reviewed, Patient's Cardiovascular Status Stable, Respiratory Function Stable, Patent Airway and No signs of Nausea or vomiting  Last Vitals:  Filed Vitals:   09/10/14 1630  BP: 129/75  Pulse: 68  Temp: 37.2 C  Resp: 13    Post-op Vital Signs: stable   Complications: No apparent anesthesia complications

## 2014-09-10 NOTE — Progress Notes (Signed)
  CARE MANAGEMENT ED NOTE 09/10/2014  Patient:  Jasmin Stephenson, Jasmin Stephenson   Account Number:  [id number]  Date Initiated:  09/10/2014  Documentation initiated by:  Jackelyn Poling  Subjective/Objective Assessment:   43 yr old [place] access pt PER [facility] - pt from MAU with c/o breast mass, seen and has been worked up for same, sent here for further eval.     Subjective/Objective Assessment Detail:   Gracy Racer is listed in e medicaid response hx as pcp Barbaraann Faster, RN, CCM [phone]     Action/Plan:   EPIC updated with name of pcp listed per e medicaid response hx   Action/Plan Detail:   Anticipated DC Date:  09/11/2014     Status Recommendation to Physician:   Result of Recommendation:    Other ED Services  Consult Working [place]  Other  Outpatient Services - Pt will follow up  PCP issues    Choice offered to / List presented to:            Status of service:  Completed, signed off  ED Comments:   ED Comments Detail:

## 2014-09-10 NOTE — ED Provider Notes (Signed)
Patient to the ER from MAU for breast mass- abscess to right breast. She will see Dr. Redmond Pulling. Dr Redmond Pulling in room during my interview. Plans at this time are to take her to surgery for I&D. Pt has no concerns or questions for me at this time and is hemodynamically stable.  Results for orders placed during the hospital encounter of 09/10/14  URINALYSIS, ROUTINE W REFLEX MICROSCOPIC      Result Value Ref Range   Color, Urine YELLOW  YELLOW   APPearance CLOUDY (*) CLEAR   Specific Gravity, Urine 1.010  1.005 - 1.030   pH 6.0  5.0 - 8.0   Glucose, UA NEGATIVE  NEGATIVE mg/dL   Hgb urine dipstick TRACE (*) NEGATIVE   Bilirubin Urine SMALL (*) NEGATIVE   Ketones, ur 15 (*) NEGATIVE mg/dL   Protein, ur NEGATIVE  NEGATIVE mg/dL   Urobilinogen, UA 0.2  0.0 - 1.0 mg/dL   Nitrite NEGATIVE  NEGATIVE   Leukocytes, UA TRACE (*) NEGATIVE  PREGNANCY, URINE      Result Value Ref Range   Preg Test, Ur NEGATIVE  NEGATIVE  CBC WITH DIFFERENTIAL      Result Value Ref Range   WBC 10.7 (*) 4.0 - 10.5 K/uL   RBC 4.51  3.87 - 5.11 MIL/uL   Hemoglobin 14.3  12.0 - 15.0 g/dL   HCT 40.9  36.0 - 46.0 %   MCV 90.7  78.0 - 100.0 fL   MCH 31.7  26.0 - 34.0 pg   MCHC 35.0  30.0 - 36.0 g/dL   RDW 14.0  11.5 - 15.5 %   Platelets 358  150 - 400 K/uL   Neutrophils Relative % 65  43 - 77 %   Neutro Abs 6.9  1.7 - 7.7 K/uL   Lymphocytes Relative 24  12 - 46 %   Lymphs Abs 2.6  0.7 - 4.0 K/uL   Monocytes Relative 10  3 - 12 %   Monocytes Absolute 1.1 (*) 0.1 - 1.0 K/uL   Eosinophils Relative 1  0 - 5 %   Eosinophils Absolute 0.1  0.0 - 0.7 K/uL   Basophils Relative 0  0 - 1 %   Basophils Absolute 0.0  0.0 - 0.1 K/uL  URINE MICROSCOPIC-ADD ON      Result Value Ref Range   Squamous Epithelial / LPF MANY (*) RARE   WBC, UA 0-2  <3 WBC/hpf   Bacteria, UA FEW (*) RARE   Urine-Other MUCOUS PRESENT    POCT PREGNANCY, URINE      Result Value Ref Range   Preg Test, Ur NEGATIVE  NEGATIVE   No results  found. '  Filed Vitals:   09/10/14 1306  BP: 144/96  Pulse: 68  Temp: 98.5 F (36.9 C)  Resp: 16    Admit. Wl. Inpatient. Dr. Alden Hipp Surg  Jonelle Sidle Marilu Favre, PA-C 09/10/14 1349

## 2014-09-10 NOTE — MAU Note (Signed)
Here last Wed. For knot on her breast.  Pain has gotten worse. Hurts to move arm. Can't sleep because of pain. Talked to someone on the phone, was told to come back.

## 2014-09-10 NOTE — H&P (Signed)
Chief Complaint: right breast abscess HPI: Jasmin Stephenson is a 43 year old female with a history of tobacco use, anxiety/dpression who presents from [facility] with a right breast abscess.  The patient reports her symptoms initially started about 3 weeks ago at which time she noticed a pimple.  Overtime, this increased in size and became more painful.  She denies nipple discharge, drainage, fever, chills or sweats.  Modifying factors include; warm compress without out help.  Moderate in severity.  No aggravating or alleviating factors.  Symptoms are constant.  Denies previous symptoms.  She reports +polys, no known history of diabetes mellitus.  She reports some right sided chest pains without radiation, nausea or back pain.  Denies a cardiac history, however, reports intermittent DOE.  She is a 1.5ppd smoker.  Her work up shows a white count of 10.7K, negative UPT.  Previous surgeries include a Nissen by Dr. Zella Richer in 04/2011 and a bladder surgery by Dr. Terance Hart for retention.  She denies use of blood thinners.    Past Medical History  Diagnosis Date  . Breast calcification, right   . Depression   . Anxiety     Past Surgical History  Procedure Laterality Date  . Abdominal surgery    . Bladder surgery      Family History  Problem Relation Age of Onset  . Alcohol abuse Neg Hx   . Arthritis Neg Hx   . Asthma Neg Hx   . Birth defects Neg Hx   . Cancer Neg Hx   . COPD Neg Hx   . Depression Neg Hx   . Drug abuse Neg Hx   . Early death Neg Hx   . Hearing loss Neg Hx   . Heart disease Neg Hx   . Hyperlipidemia Neg Hx   . Hypertension Neg Hx   . Kidney disease Neg Hx   . Learning disabilities Neg Hx   . Mental illness Neg Hx   . Mental retardation Neg Hx   . Miscarriages / Stillbirths Neg Hx   . Stroke Neg Hx   . Vision loss Neg Hx   . Diabetes Father   . Breast cancer Sister    Social History:  reports that she has been smoking Cigarettes.  She has a 10.5 pack-year smoking  history. She has never used smokeless tobacco. She reports that she drinks about 1.2 ounces of alcohol per week. She reports that she does not use illicit drugs.  Allergies:  Allergies  Allergen Reactions  . Penicillins Rash   Medication Prior to Admission medications   Medication Sig Start Date End Date Taking? Authorizing Provider  albuterol (PROVENTIL HFA;VENTOLIN HFA) 108 (90 BASE) MCG/ACT inhaler Inhale 2 puffs into the lungs every 6 (six) hours as needed for wheezing or shortness of breath.    Historical Provider, MD  flintstones complete (FLINTSTONES) 60 MG chewable tablet Chew 1 tablet by mouth daily.    Historical Provider, MD  ibuprofen (ADVIL,MOTRIN) 600 MG tablet Take 1 tablet (600 mg total) by mouth every 6 (six) hours as needed. 09/05/14   Darrelyn Hillock Rasch, NP  Oxcarbazepine (TRILEPTAL) 300 MG tablet Take 300 mg by mouth 2 (two) times daily.    Historical Provider, MD     (Not in a hospital admission)  Results for orders placed during the hospital encounter of 09/10/14 (from the past 48 hour(s))  URINALYSIS, ROUTINE W REFLEX MICROSCOPIC     Status: Abnormal   Collection Time    09/10/14 10:30  AM      Result Value Ref Range   Color, Urine YELLOW  YELLOW   APPearance CLOUDY (*) CLEAR   Specific Gravity, Urine 1.010  1.005 - 1.030   pH 6.0  5.0 - 8.0   Glucose, UA NEGATIVE  NEGATIVE mg/dL   Hgb urine dipstick TRACE (*) NEGATIVE   Bilirubin Urine SMALL (*) NEGATIVE   Ketones, ur 15 (*) NEGATIVE mg/dL   Protein, ur NEGATIVE  NEGATIVE mg/dL   Urobilinogen, UA 0.2  0.0 - 1.0 mg/dL   Nitrite NEGATIVE  NEGATIVE   Leukocytes, UA TRACE (*) NEGATIVE  PREGNANCY, URINE     Status: None   Collection Time    09/10/14 10:30 AM      Result Value Ref Range   Preg Test, Ur NEGATIVE  NEGATIVE   Comment:            THE SENSITIVITY OF THIS     METHODOLOGY IS >20 mIU/mL.  CBC WITH DIFFERENTIAL     Status: Abnormal   Collection Time    09/10/14 10:30 AM      Result Value Ref  Range   WBC 10.7 (*) 4.0 - 10.5 K/uL   RBC 4.51  3.87 - 5.11 MIL/uL   Hemoglobin 14.3  12.0 - 15.0 g/dL   HCT 40.9  36.0 - 46.0 %   MCV 90.7  78.0 - 100.0 fL   MCH 31.7  26.0 - 34.0 pg   MCHC 35.0  30.0 - 36.0 g/dL   RDW 14.0  11.5 - 15.5 %   Platelets 358  150 - 400 K/uL   Neutrophils Relative % 65  43 - 77 %   Neutro Abs 6.9  1.7 - 7.7 K/uL   Lymphocytes Relative 24  12 - 46 %   Lymphs Abs 2.6  0.7 - 4.0 K/uL   Monocytes Relative 10  3 - 12 %   Monocytes Absolute 1.1 (*) 0.1 - 1.0 K/uL   Eosinophils Relative 1  0 - 5 %   Eosinophils Absolute 0.1  0.0 - 0.7 K/uL   Basophils Relative 0  0 - 1 %   Basophils Absolute 0.0  0.0 - 0.1 K/uL  URINE MICROSCOPIC-ADD ON     Status: Abnormal   Collection Time    09/10/14 10:30 AM      Result Value Ref Range   Squamous Epithelial / LPF MANY (*) RARE   WBC, UA 0-2  <3 WBC/hpf   Bacteria, UA FEW (*) RARE   Urine-Other MUCOUS PRESENT    POCT PREGNANCY, URINE     Status: None   Collection Time    09/10/14 10:34 AM      Result Value Ref Range   Preg Test, Ur NEGATIVE  NEGATIVE   Comment:            THE SENSITIVITY OF THIS     METHODOLOGY IS >24 mIU/mL   No results found.  Review of Systems  All other systems reviewed and are negative.   Blood pressure 144/96, pulse 68, temperature 98.5 F (36.9 C), temperature source Oral, resp. rate 16, last menstrual period 08/28/2014, SpO2 98.00%. Physical Exam  Constitutional: She is oriented to person, place, and time. She appears well-developed and well-nourished. No distress.  HENT:  Head: Normocephalic and atraumatic.  Cardiovascular: Normal rate, regular rhythm, normal heart sounds and intact distal pulses.  Exam reveals no gallop and no friction rub.   No murmur heard. Respiratory: Effort normal and breath sounds  normal. No respiratory distress. She has no wheezes. She has no rales. She exhibits no tenderness.  GI: Soft. Bowel sounds are normal. She exhibits no distension and no mass.  There is no tenderness. There is no rebound and no guarding.  Musculoskeletal: Normal range of motion. She exhibits no edema and no tenderness.  Neurological: She is alert and oriented to person, place, and time.  Skin: Skin is warm. No rash noted. She is not diaphoretic. No pallor.  approx 3-4cm area of fluctuance, induration to right areola, tender and erythematous.    Psychiatric: She has a normal mood and affect. Her behavior is normal. Judgment and thought content normal.     Assessment/Plan Right breast abscess -proceed with a incision and drainage under anesthesia this afternoon -NPO(last intake Sunday) -obtain consent -start clindamycin -encouraged tobacco cessation -+poly's, add Hgb A1c  -obtain pre-op EKG -SCD, post op lovenox unless otherwise contraindicated  -IVF  -pain control   Raeleen Winstanley ANP-BC 09/10/2014, 1:39 PM

## 2014-09-10 NOTE — Transfer of Care (Signed)
Immediate Anesthesia Transfer of Care Note  Patient: Jasmin Stephenson  Procedure(s) Performed: Procedure(s): IRRIGATION AND DEBRIDEMENT RIGHT BREAST  ABSCESS (Right)  Patient Location: PACU  Anesthesia Type:General  Level of Consciousness: awake, alert  and oriented  Airway & Oxygen Therapy: Patient Spontanous Breathing and Patient connected to face mask oxygen  Post-op Assessment: Report given to PACU RN and Post -op Vital signs reviewed and stable  Post vital signs: Reviewed and stable  Complications: No apparent anesthesia complications

## 2014-09-10 NOTE — H&P (Signed)
I saw the patient, participated in the history, exam and medical decision making, and concur with the physician assistant's note above.  Discussed risk/benefits of surgery bleeding, infection, injury to surrounding structures, sensation loss, need for additional procedures, nipple loss,  recurrence, anesthesia, scarring, and typical postop course.  Jasmin Stephenson. Redmond Pulling, MD, FACS General, Bariatric, & Minimally Invasive Surgery [facility], [place]

## 2014-09-11 ENCOUNTER — Encounter (HOSPITAL_COMMUNITY): Payer: Self-pay | Admitting: General Surgery

## 2014-09-11 DIAGNOSIS — Z87891 Personal history of nicotine dependence: Secondary | ICD-10-CM | POA: Diagnosis not present

## 2014-09-11 DIAGNOSIS — F3289 Other specified depressive episodes: Secondary | ICD-10-CM | POA: Diagnosis present

## 2014-09-11 DIAGNOSIS — N63 Unspecified lump in unspecified breast: Secondary | ICD-10-CM | POA: Diagnosis present

## 2014-09-11 DIAGNOSIS — Z880 Allergy status to penicillin: Secondary | ICD-10-CM | POA: Diagnosis not present

## 2014-09-11 DIAGNOSIS — F329 Major depressive disorder, single episode, unspecified: Secondary | ICD-10-CM | POA: Diagnosis present

## 2014-09-11 DIAGNOSIS — E119 Type 2 diabetes mellitus without complications: Secondary | ICD-10-CM | POA: Diagnosis present

## 2014-09-11 DIAGNOSIS — N61 Mastitis without abscess: Secondary | ICD-10-CM | POA: Diagnosis present

## 2014-09-11 LAB — BASIC METABOLIC PANEL
ANION GAP: 13 (ref 5–15)
BUN: 5 mg/dL — ABNORMAL LOW (ref 6–23)
CHLORIDE: 102 meq/L (ref 96–112)
CO2: 23 mEq/L (ref 19–32)
CREATININE: 0.85 mg/dL (ref 0.50–1.10)
Calcium: 8.3 mg/dL — ABNORMAL LOW (ref 8.4–10.5)
GFR, EST NON AFRICAN AMERICAN: 84 mL/min — AB (ref >90)
Glucose, Bld: 114 mg/dL — ABNORMAL HIGH (ref 70–99)
Potassium: 3.6 mEq/L — ABNORMAL LOW (ref 3.7–5.3)
Sodium: 138 mEq/L (ref 137–147)

## 2014-09-11 LAB — CBC
HCT: 35.6 % — ABNORMAL LOW (ref 36.0–46.0)
Hemoglobin: 12.1 g/dL (ref 12.0–15.0)
MCH: 30.7 pg (ref 26.0–34.0)
MCHC: 34 g/dL (ref 30.0–36.0)
MCV: 90.4 fL (ref 78.0–100.0)
PLATELETS: 332 10*3/uL (ref 150–400)
RBC: 3.94 MIL/uL (ref 3.87–5.11)
RDW: 13.9 % (ref 11.5–15.5)
WBC: 13.4 10*3/uL — AB (ref 4.0–10.5)

## 2014-09-11 LAB — TSH: TSH: 0.952 u[IU]/mL (ref 0.350–4.500)

## 2014-09-11 MED ORDER — HYDROMORPHONE HCL 1 MG/ML IJ SOLN: 1.0000 mg | Freq: Once | INTRAMUSCULAR | Status: AC

## 2014-09-11 MED ORDER — OXYCODONE HCL 5 MG PO TABS: 10.0000 mg | ORAL_TABLET | Freq: Four times a day (QID) | ORAL | Status: DC | PRN

## 2014-09-11 MED ORDER — HYDROMORPHONE HCL 1 MG/ML IJ SOLN
INTRAMUSCULAR | Status: AC
  Administered 2014-09-11: 1 mg
  Filled 2014-09-11: qty 1

## 2014-09-11 MED ORDER — SODIUM CHLORIDE 0.9 % IJ SOLN: 3.0000 mL | INTRAMUSCULAR | Status: DC | PRN

## 2014-09-11 MED ORDER — SODIUM CHLORIDE 0.9 % IJ SOLN
3.0000 mL | Freq: Two times a day (BID) | INTRAMUSCULAR | Status: DC
  Administered 2014-09-11: 3 mL via INTRAVENOUS

## 2014-09-11 NOTE — Progress Notes (Signed)
Discussed with NP Should be home [place]. Redmond Pulling, MD, FACS General, Bariatric, & Minimally Invasive Surgery [facility], [place]

## 2014-09-11 NOTE — Progress Notes (Signed)
UR completed 

## 2014-09-11 NOTE — Progress Notes (Signed)
Breast binder placed on patient

## 2014-09-11 NOTE — Progress Notes (Signed)
Patient ID: Jasmin Stephenson, female   DOB: 1971/05/02, 43 y.o.   MRN: [id number]     [facility]      [address] [place], [place] [address]    Phone: [phone] FAX: [phone]     Subjective: No n/v.  Afebrile.  WBC trending down.   Objective:  Vital signs:  Filed Vitals:   09/10/14 1956 09/10/14 2100 09/10/14 2202 09/11/14 0212  BP: 118/81  132/83 126/86  Pulse: 76  71 79  Temp: 98.9 F (37.2 C)  98.6 F (37 C) 99.4 F (37.4 C)  TempSrc: Oral  Oral Oral  Resp: 16  16 16   Height:  5' 4"  (1.626 m)    Weight:  174 lb (78.926 kg)    SpO2: 100%  100% 100%       Intake/Output   Yesterday:  09/28 0701 - 09/29 0700 In: 2583.3 [P.O.:720; I.V.:1763.3; IV Piggyback:100] Out: 1250 [Urine:1250] This shift:  Total I/O In: 120 [P.O.:120] Out: 350 [Urine:350]   Physical Exam: General: Pt awake/alert/oriented x4 in no  acute distress Chest: cta. No chest wall pain w good excursion CV:  Pulses intact.  Regular rhythm Abdomen: Soft.  Nondistended.  Non tender.  No evidence of peritonitis.  No incarcerated hernias. Ext:  SCDs BLE.  No mjr edema.  No cyanosis Skin: right breast, tender, no surrounding erythema or induration, packing replaced.   Problem List:   Active Problems:   Abscess of right breast    Results:   Labs: Results for orders placed during the hospital encounter of 09/10/14 (from the past 48 hour(s))  URINALYSIS, ROUTINE W REFLEX MICROSCOPIC     Status: Abnormal   Collection Time    09/10/14 10:30 AM      Result Value Ref Range   Color, Urine YELLOW  YELLOW   APPearance CLOUDY (*) CLEAR   Specific Gravity, Urine 1.010  1.005 - 1.030   pH 6.0  5.0 - 8.0   Glucose, UA NEGATIVE  NEGATIVE mg/dL   Hgb urine dipstick TRACE (*) NEGATIVE   Bilirubin Urine SMALL (*) NEGATIVE   Ketones, ur 15 (*) NEGATIVE mg/dL   Protein, ur NEGATIVE  NEGATIVE mg/dL   Urobilinogen, UA 0.2  0.0 - 1.0 mg/dL   Nitrite  NEGATIVE  NEGATIVE   Leukocytes, UA TRACE (*) NEGATIVE  PREGNANCY, URINE     Status: None   Collection Time    09/10/14 10:30 AM      Result Value Ref Range   Preg Test, Ur NEGATIVE  NEGATIVE   Comment:            THE SENSITIVITY OF THIS     METHODOLOGY IS >20 mIU/mL.  CBC WITH DIFFERENTIAL     Status: Abnormal   Collection Time    09/10/14 10:30 AM      Result Value Ref Range   WBC 10.7 (*) 4.0 - 10.5 K/uL   RBC 4.51  3.87 - 5.11 MIL/uL   Hemoglobin 14.3  12.0 - 15.0 g/dL   HCT 40.9  36.0 - 46.0 %   MCV 90.7  78.0 - 100.0 fL   MCH 31.7  26.0 - 34.0 pg   MCHC 35.0  30.0 - 36.0 g/dL   RDW 14.0  11.5 - 15.5 %   Platelets 358  150 - 400 K/uL   Neutrophils Relative % 65  43 - 77 %   Neutro Abs 6.9  1.7 - 7.7 K/uL  Lymphocytes Relative 24  12 - 46 %   Lymphs Abs 2.6  0.7 - 4.0 K/uL   Monocytes Relative 10  3 - 12 %   Monocytes Absolute 1.1 (*) 0.1 - 1.0 K/uL   Eosinophils Relative 1  0 - 5 %   Eosinophils Absolute 0.1  0.0 - 0.7 K/uL   Basophils Relative 0  0 - 1 %   Basophils Absolute 0.0  0.0 - 0.1 K/uL  URINE MICROSCOPIC-ADD ON     Status: Abnormal   Collection Time    09/10/14 10:30 AM      Result Value Ref Range   Squamous Epithelial / LPF MANY (*) RARE   WBC, UA 0-2  <3 WBC/hpf   Bacteria, UA FEW (*) RARE   Urine-Other MUCOUS PRESENT    POCT PREGNANCY, URINE     Status: None   Collection Time    09/10/14 10:34 AM      Result Value Ref Range   Preg Test, Ur NEGATIVE  NEGATIVE   Comment:            THE SENSITIVITY OF THIS     METHODOLOGY IS >24 mIU/mL  BASIC METABOLIC PANEL     Status: None   Collection Time    09/10/14  1:44 PM      Result Value Ref Range   Sodium 139  137 - 147 mEq/L   Potassium 3.9  3.7 - 5.3 mEq/L   Chloride 102  96 - 112 mEq/L   CO2 22  19 - 32 mEq/L   Glucose, Bld 83  70 - 99 mg/dL   BUN 7  6 - 23 mg/dL   Creatinine, Ser 0.80  0.50 - 1.10 mg/dL   Calcium 8.9  8.4 - 10.5 mg/dL   GFR calc non Af Amer >90  >90 mL/min   GFR calc Af Amer  >90  >90 mL/min   Comment: (NOTE)     The eGFR has been calculated using the CKD EPI equation.     This calculation has not been validated in all clinical situations.     eGFR's persistently <90 mL/min signify possible Chronic Kidney     Disease.   Anion gap 15  5 - 15  HEMOGLOBIN A1C     Status: None   Collection Time    09/10/14  1:44 PM      Result Value Ref Range   Hemoglobin A1C 5.4  <5.7 %   Comment: (NOTE)                                                                               According to the ADA Clinical Practice Recommendations for 2011, when     HbA1c is used as a screening test:      >=6.5%   Diagnostic of Diabetes Mellitus               (if abnormal result is confirmed)     5.7-6.4%   Increased risk of developing Diabetes Mellitus     References:Diagnosis and Classification of Diabetes Mellitus,Diabetes     VQMG,8676,19(JKDTO 1):S62-S69 and Standards of Medical Care in  Diabetes - 2011,Diabetes Care,2011,34 (Suppl 1):S11-S61.   Mean Plasma Glucose 108  <117 mg/dL   Comment: Performed at [place]     Status: None   Collection Time    09/10/14  3:05 PM      Result Value Ref Range   Specimen Description ABSCESS RIGHT BREAST     Special Requests NONE     Gram Stain       Value: ABUNDANT WBC PRESENT,BOTH PMN AND MONONUCLEAR     RARE SQUAMOUS EPITHELIAL CELLS PRESENT     FEW GRAM POSITIVE COCCI     IN PAIRS IN CLUSTERS     Performed at [facility]   Culture PENDING     Report Status PENDING    CULTURE, ROUTINE-ABSCESS     Status: None   Collection Time    09/10/14  3:05 PM      Result Value Ref Range   Specimen Description ABSCESS RIGHT BREAST     Special Requests NONE     Gram Stain       Value: ABUNDANT WBC PRESENT,BOTH PMN AND MONONUCLEAR     RARE SQUAMOUS EPITHELIAL CELLS PRESENT     FEW GRAM POSITIVE COCCI     IN PAIRS IN CLUSTERS     Performed at [facility]   Culture       Value: NO  GROWTH     Performed at [facility]   Report Status PENDING    CBC     Status: Abnormal   Collection Time    09/11/14  4:43 AM      Result Value Ref Range   WBC 13.4 (*) 4.0 - 10.5 K/uL   RBC 3.94  3.87 - 5.11 MIL/uL   Hemoglobin 12.1  12.0 - 15.0 g/dL   HCT 35.6 (*) 36.0 - 46.0 %   MCV 90.4  78.0 - 100.0 fL   MCH 30.7  26.0 - 34.0 pg   MCHC 34.0  30.0 - 36.0 g/dL   RDW 13.9  11.5 - 15.5 %   Platelets 332  150 - 400 K/uL  BASIC METABOLIC PANEL     Status: Abnormal   Collection Time    09/11/14  4:43 AM      Result Value Ref Range   Sodium 138  137 - 147 mEq/L   Potassium 3.6 (*) 3.7 - 5.3 mEq/L   Chloride 102  96 - 112 mEq/L   CO2 23  19 - 32 mEq/L   Glucose, Bld 114 (*) 70 - 99 mg/dL   BUN 5 (*) 6 - 23 mg/dL   Creatinine, Ser 0.85  0.50 - 1.10 mg/dL   Calcium 8.3 (*) 8.4 - 10.5 mg/dL   GFR calc non Af Amer 84 (*) >90 mL/min   GFR calc Af Amer >90  >90 mL/min   Comment: (NOTE)     The eGFR has been calculated using the CKD EPI equation.     This calculation has not been validated in all clinical situations.     eGFR's persistently <90 mL/min signify possible Chronic Kidney     Disease.   Anion gap 13  5 - 15    Imaging / Studies: No results found.  Medications / Allergies:  Scheduled Meds: . clindamycin (CLEOCIN) IV  900 mg Intravenous 3 times per day  . enoxaparin (LOVENOX) injection  40 mg Subcutaneous Q24H  . Influenza vac split quadrivalent PF  0.5 mL Intramuscular Tomorrow-1000  .  Oxcarbazepine  300 mg Oral BID  . pneumococcal 23 valent vaccine  0.5 mL Intramuscular Tomorrow-1000   Continuous Infusions: . lactated ringers 50 mL/hr at 09/11/14 0524   PRN Meds:.acetaminophen, acetaminophen, albuterol, HYDROcodone-acetaminophen, morphine injection, ondansetron  Antibiotics: Anti-infectives   Start     Dose/Rate Route Frequency Ordered Stop   09/10/14 2200  clindamycin (CLEOCIN) IVPB 900 mg    Comments:  Pharm please confirm dosage   900 mg 100  mL/hr over 30 Minutes Intravenous 3 times per day 09/10/14 1756     09/10/14 1430  clindamycin (CLEOCIN) IVPB 900 mg     900 mg 100 mL/hr over 30 Minutes Intravenous  Once 09/10/14 1429 09/10/14 1459   09/10/14 1345  clindamycin (CLEOCIN) IVPB 600 mg  Status:  Discontinued     600 mg 100 mL/hr over 30 Minutes Intravenous 4 times per day 09/10/14 1340 09/10/14 1428        Assessment/Plan Right breast abscess POD#1 I&D -start BID dressing changes, 1/4 iodoform  -pain control, change to oxycodone -follow cultures, few GPC -continue with clindamycin  -SCD/lovenox -SLIV -home when she is able to tolerate dressing changes  Erby Pian, ANP-BC [place] [facility] Pager [phone](7A-4:30P)  09/11/2014 11:23 AM

## 2014-09-12 LAB — BASIC METABOLIC PANEL
ANION GAP: 12 (ref 5–15)
BUN: 4 mg/dL — ABNORMAL LOW (ref 6–23)
CHLORIDE: 101 meq/L (ref 96–112)
CO2: 24 mEq/L (ref 19–32)
Calcium: 8.7 mg/dL (ref 8.4–10.5)
Creatinine, Ser: 0.88 mg/dL (ref 0.50–1.10)
GFR calc non Af Amer: 81 mL/min — ABNORMAL LOW (ref >90)
Glucose, Bld: 92 mg/dL (ref 70–99)
POTASSIUM: 3.8 meq/L (ref 3.7–5.3)
SODIUM: 137 meq/L (ref 137–147)

## 2014-09-12 LAB — CBC
HCT: 35.9 % — ABNORMAL LOW (ref 36.0–46.0)
Hemoglobin: 12 g/dL (ref 12.0–15.0)
MCH: 30 pg (ref 26.0–34.0)
MCHC: 33.4 g/dL (ref 30.0–36.0)
MCV: 89.8 fL (ref 78.0–100.0)
PLATELETS: 351 10*3/uL (ref 150–400)
RBC: 4 MIL/uL (ref 3.87–5.11)
RDW: 13.8 % (ref 11.5–15.5)
WBC: 7.9 10*3/uL (ref 4.0–10.5)

## 2014-09-12 MED ORDER — OXYCODONE HCL 10 MG PO TABS: 10.0000 mg | ORAL_TABLET | Freq: Four times a day (QID) | ORAL | Status: DC | PRN

## 2014-09-12 MED ORDER — CLINDAMYCIN HCL 300 MG PO CAPS: 300.0000 mg | ORAL_CAPSULE | Freq: Three times a day (TID) | ORAL | Status: DC

## 2014-09-12 MED ORDER — ACETAMINOPHEN 325 MG PO TABS: 650.0000 mg | ORAL_TABLET | Freq: Four times a day (QID) | ORAL | Status: DC | PRN

## 2014-09-12 MED ORDER — OXYCODONE HCL 5 MG PO TABS
10.0000 mg | ORAL_TABLET | Freq: Four times a day (QID) | ORAL | Status: DC | PRN
  Administered 2014-09-12: 10 mg via ORAL
  Filled 2014-09-12: qty 2

## 2014-09-12 NOTE — Discharge Instructions (Signed)

## 2014-09-12 NOTE — Care Management Note (Signed)
    Page 1 of 1   09/12/2014     10:02:14 AM CARE MANAGEMENT NOTE 09/12/2014  Patient:  Jasmin Stephenson, Jasmin Stephenson   Account Number:  [id number]  Date Initiated:  09/12/2014  Documentation initiated by:  Sunday Spillers  Subjective/Objective Assessment:   42 yo female admitted s/p I&D of breast. PTA lived at home alone.     Action/Plan:   Home when stable   Anticipated DC Date:  09/12/2014   Anticipated DC Plan:  [place]  CM consult  PCP issues      [facility] Choice  HOME HEALTH   Choice offered to / List presented to:  C-1 Patient        [place] arranged  HH-1 RN      [place].   Status of service:  Completed, signed off Medicare Important Message given?   (If response is "NO", the following Medicare IM given date fields will be blank) Date Medicare IM given:   Medicare IM given by:   Date Additional Medicare IM given:   Additional Medicare IM given by:    Discharge Disposition:  [place]  Per UR Regulation:  Reviewed for med. necessity/level of care/duration of stay  If discussed at [place] of Stay Meetings, dates discussed:    Comments:  09-12-14 Sunday Spillers RN CM 1000 Spoke with patient at bedside. She has requested information on obtaining a new PCP, provided her with the [facility] #. She was also advised she could go through her Medicaid Education officer, museum. Discussed need for [facility] services for dressing change. Offered choice, set her up with [facility]. She has a friend who she wants to assist her with dressing changes. Shared this with [facility].

## 2014-09-12 NOTE — Discharge Summary (Signed)
Kellyn Mccary M. Dorfman, MD, FACS General, Bariatric, & Minimally Invasive Surgery [facility]  

## 2014-09-12 NOTE — Progress Notes (Signed)
Discharge instructions and prescriptions given to patient.  Patient does not have any questions at this time

## 2014-09-12 NOTE — Discharge Summary (Signed)
Physician Discharge Summary  ARDA DAGGS KGM:[id number] DOB: 06-16-1971 DOA: 09/10/2014  PCP: Frederico Hamman, MD  Consultation: none  Admit date: 09/10/2014 Discharge date: 09/12/2014  Recommendations for Outpatient Follow-up:   Follow-up Information   Follow up with [facility].   Contact information:   [address] [place]   [place] [address] [phone]      Discharge Diagnoses:  1. Right breast abscess   Surgical Procedure: I&D--Dr. Redmond Pulling 09/10/14  Discharge Condition: stable Disposition: home  Diet recommendation: regular  Filed Weights   09/10/14 2100  Weight: 174 lb (78.926 kg)       Hospital Course:  Jasmin Stephenson is a 43 year old female with a history of tobacco use, anxiety/dpression who presented to [facility] from [facility] with a right breast abscess. She is a 1.5ppd smoker. Her work up showed a white count of 10.7K, negative UPT. She underwent an I&D in the OR.  She was transferred to the floor. She remained stable.  She was kept in the hospital for an additional day for pain control and dressing changes teaching.  We obtained a hgb A1c which was 5.4  She was also quite concerned about her thyroid and the hair loss she has had.  Her TSH was normal.  I explained to the patient that this is a non hospital, non urgent problem and she needs to follow up with her PCP.  She was provided with PCPs in her area.  She verbalizes understanding.  Her cultures showed few GPC, final cultures and sensitivities are pending.  She was kept on clindamycin and will finish out her 7 day course.  On POD#2 the patient was felt stable for discharge.  A follow up has been made on her behalf and is aware of date and time.  She was encouraged to call with questions or concerns.  Home health was also arranged.     Physical Exam:  General: Pt awake/alert/oriented x4 in no acute distress  Skin: right breast, tender, no surrounding erythema or induration,  packing replaced.    Discharge Instructions     Medication List         acetaminophen 325 MG tablet  Commonly known as:  TYLENOL  Take 2 tablets (650 mg total) by mouth every 6 (six) hours as needed for mild pain (or Temp > 100).     albuterol 108 (90 BASE) MCG/ACT inhaler  Commonly known as:  PROVENTIL HFA;VENTOLIN HFA  Inhale 2 puffs into the lungs every 6 (six) hours as needed for wheezing or shortness of breath.     clindamycin 300 MG capsule  Commonly known as:  CLEOCIN  Take 1 capsule (300 mg total) by mouth 3 (three) times daily.     flintstones complete 60 MG chewable tablet  Chew 1 tablet by mouth daily.     ibuprofen 600 MG tablet  Commonly known as:  ADVIL,MOTRIN  Take 1 tablet (600 mg total) by mouth every 6 (six) hours as needed.     Oxcarbazepine 300 MG tablet  Commonly known as:  TRILEPTAL  Take 300 mg by mouth 2 (two) times daily.     Oxycodone HCl 10 MG Tabs  Take 1-1.5 tablets (10-15 mg total) by mouth every 6 (six) hours as needed for severe pain or breakthrough pain (before dressing changes).           Follow-up Information   Follow up with [facility].  Contact information:   [address] [place]   [place] [address] [phone]        The results of significant diagnostics from this hospitalization (including imaging, microbiology, ancillary and laboratory) are listed below for reference.    Significant Diagnostic Studies: No results found.  Microbiology: Recent Results (from the past 240 hour(s))  ANAEROBIC CULTURE     Status: None   Collection Time    09/10/14  3:05 PM      Result Value Ref Range Status   Specimen Description ABSCESS RIGHT BREAST   Final   Special Requests NONE   Final   Gram Stain     Final   Value: ABUNDANT WBC PRESENT,BOTH PMN AND MONONUCLEAR     RARE SQUAMOUS EPITHELIAL CELLS PRESENT     FEW GRAM POSITIVE COCCI     IN PAIRS IN CLUSTERS     Performed at [facility]    Culture     Final   Value: NO ANAEROBES ISOLATED; CULTURE IN PROGRESS FOR 5 DAYS     Performed at [facility]   Report Status PENDING   Incomplete  CULTURE, ROUTINE-ABSCESS     Status: None   Collection Time    09/10/14  3:05 PM      Result Value Ref Range Status   Specimen Description ABSCESS RIGHT BREAST   Final   Special Requests NONE   Final   Gram Stain     Final   Value: ABUNDANT WBC PRESENT,BOTH PMN AND MONONUCLEAR     RARE SQUAMOUS EPITHELIAL CELLS PRESENT     FEW GRAM POSITIVE COCCI     IN PAIRS IN CLUSTERS     Performed at [facility]   Culture     Final   Value: FEW STAPHYLOCOCCUS SPECIES (COAGULASE NEGATIVE)     Performed at [facility]   Report Status PENDING   Incomplete     Labs: Basic Metabolic Panel:  Recent Labs Lab 09/10/14 1344 09/11/14 0443 09/12/14 0517  NA 139 138 137  K 3.9 3.6* 3.8  CL 102 102 101  CO2 22 23 24   GLUCOSE 83 114* 92  BUN 7 5* 4*  CREATININE 0.80 0.85 0.88  CALCIUM 8.9 8.3* 8.7   Liver Function Tests: No results found for this basename: AST, ALT, ALKPHOS, BILITOT, PROT, ALBUMIN,  in the last 168 hours No results found for this basename: LIPASE, AMYLASE,  in the last 168 hours No results found for this basename: AMMONIA,  in the last 168 hours CBC:  Recent Labs Lab 09/10/14 1030 09/11/14 0443 09/12/14 0517  WBC 10.7* 13.4* 7.9  NEUTROABS 6.9  --   --   HGB 14.3 12.1 12.0  HCT 40.9 35.6* 35.9*  MCV 90.7 90.4 89.8  PLT 358 332 351   Cardiac Enzymes: No results found for this basename: CKTOTAL, CKMB, CKMBINDEX, TROPONINI,  in the last 168 hours BNP: BNP (last 3 results) No results found for this basename: PROBNP,  in the last 8760 hours CBG: No results found for this basename: GLUCAP,  in the last 168 hours  Active Problems:   Abscess of right breast   Time coordinating discharge: <30 mins  Signed:  Kandra Graven, ANP-BC

## 2014-09-13 LAB — CULTURE, ROUTINE-ABSCESS

## 2014-09-15 LAB — ANAEROBIC CULTURE

## 2014-10-15 ENCOUNTER — Encounter (HOSPITAL_COMMUNITY): Payer: Self-pay | Admitting: General Surgery

## 2014-11-06 ENCOUNTER — Encounter (HOSPITAL_COMMUNITY): Payer: Self-pay | Admitting: *Deleted

## 2014-11-06 ENCOUNTER — Inpatient Hospital Stay (HOSPITAL_COMMUNITY)
Admission: AD | Admit: 2014-11-06 | Discharge: 2014-11-06 | Disposition: A | Discharge status: Home / Self Care | Payer: Medicaid Other | Source: Ambulatory Visit | Attending: Obstetrics and Gynecology | Admitting: Obstetrics and Gynecology

## 2014-11-06 DIAGNOSIS — B3731 Acute candidiasis of vulva and vagina: Secondary | ICD-10-CM

## 2014-11-06 DIAGNOSIS — B373 Candidiasis of vulva and vagina: Secondary | ICD-10-CM

## 2014-11-06 LAB — URINALYSIS, ROUTINE W REFLEX MICROSCOPIC
Bilirubin Urine: NEGATIVE
Glucose, UA: NEGATIVE mg/dL
Hgb urine dipstick: NEGATIVE
Ketones, ur: NEGATIVE mg/dL
LEUKOCYTES UA: NEGATIVE
NITRITE: NEGATIVE
Protein, ur: NEGATIVE mg/dL
SPECIFIC GRAVITY, URINE: 1.015 (ref 1.005–1.030)
UROBILINOGEN UA: 0.2 mg/dL (ref 0.0–1.0)
pH: 6 (ref 5.0–8.0)

## 2014-11-06 LAB — WET PREP, GENITAL
CLUE CELLS WET PREP: NONE SEEN
Trich, Wet Prep: NONE SEEN
Yeast Wet Prep HPF POC: NONE SEEN

## 2014-11-06 LAB — HIV ANTIBODY (ROUTINE TESTING W REFLEX): HIV 1&2 Ab, 4th Generation: NONREACTIVE

## 2014-11-06 LAB — POCT PREGNANCY, URINE: Preg Test, Ur: NEGATIVE

## 2014-11-06 MED ORDER — FLUCONAZOLE 150 MG PO TABS: ORAL_TABLET | ORAL | Status: DC

## 2014-11-06 NOTE — MAU Provider Note (Signed)
History  Chief Complaint:  Dysuria  Jasmin Stephenson is a 43 y.o. G7P3003 female presenting with irritation when she wipes herself after urination and general vaginal discomfort.  Denies any vaginal discharge or irregular bleeding.  Denies urgency, hesitancy, foul odor or burning during urination.  Wants to assure that significant other hasn't "given her something". .   Obstetrical History: OB History    Gravida Para Term Preterm AB TAB SAB Ectopic Multiple Living   3 3 3       3       Past Medical History: Past Medical History  Diagnosis Date  . Breast calcification, right   . Depression   . Anxiety     Past Surgical History: Past Surgical History  Procedure Laterality Date  . Abdominal surgery    . Bladder surgery    . Irrigation and debridement abscess Right 09/10/2014    Procedure: IRRIGATION AND DEBRIDEMENT RIGHT BREAST  ABSCESS;  Surgeon: Gayland Curry, MD;  Location: [facility];  Service: General;  Laterality: Right;    Social History: History   Social History  . Marital Status: Single    Spouse Name: N/A    Number of Children: N/A  . Years of Education: N/A   Social History Main Topics  . Smoking status: Current Every Day Smoker -- 1.50 packs/day for 7 years    Types: Cigarettes  . Smokeless tobacco: Never Used  . Alcohol Use: 1.2 oz/week    2 Cans of beer per week  . Drug Use: No  . Sexual Activity: Yes    Birth Control/ Protection: None   Other Topics Concern  . None   Social History Narrative    Allergies: Allergies  Allergen Reactions  . Penicillins Nausea And Vomiting and Rash    Prescriptions prior to admission  Medication Sig Dispense Refill Last Dose  . flintstones complete (FLINTSTONES) 60 MG chewable tablet Chew 2 tablets by mouth daily.    Past Week at Unknown time  . ibuprofen (ADVIL,MOTRIN) 600 MG tablet Take 1 tablet (600 mg total) by mouth every 6 (six) hours as needed. 30 tablet 0 Past Month at Unknown time  . Oxcarbazepine (TRILEPTAL)  300 MG tablet Take 300 mg by mouth 2 (two) times daily.   11/06/2014 at Unknown time  . acetaminophen (TYLENOL) 325 MG tablet Take 2 tablets (650 mg total) by mouth every 6 (six) hours as needed for mild pain (or Temp > 100). (Patient not taking: Reported on 11/06/2014)     . albuterol (PROVENTIL HFA;VENTOLIN HFA) 108 (90 BASE) MCG/ACT inhaler Inhale 2 puffs into the lungs every 6 (six) hours as needed for wheezing or shortness of breath.   rescue  . clindamycin (CLEOCIN) 300 MG capsule Take 1 capsule (300 mg total) by mouth 3 (three) times daily. (Patient not taking: Reported on 11/06/2014) 15 capsule 0   . oxyCODONE 10 MG TABS Take 1-1.5 tablets (10-15 mg total) by mouth every 6 (six) hours as needed for severe pain or breakthrough pain (before dressing changes). (Patient not taking: Reported on 11/06/2014) 50 tablet 0     Review of Systems  Pertinent pos/neg as indicated in HPI  Physical Exam  Blood pressure 130/91, pulse 72, temperature 98.5 F (36.9 C), temperature source Oral, resp. rate 14, height 5\' 5"  (1.651 m), weight 80.287 kg (177 lb), last menstrual period 09/24/2014. General appearance: alert, cooperative and no distress Lungs: clear to auscultation bilaterally, normal effort Heart: regular rate and rhythm Abdomen: gravid, soft, non-tender  Extremities: no edema  Spec exam: moderate amt of thick, chalky- like discharge noted on vaginal walls Cultures/Specimens: GC swab; wet prep       MAU Course  Spec exam Lab work  Labs:  Results for orders placed or performed during the hospital encounter of 11/06/14 (from the past 24 hour(s))  Urinalysis, Routine w reflex microscopic     Status: None   Collection Time: 11/06/14  8:05 AM  Result Value Ref Range   Color, Urine YELLOW YELLOW   APPearance CLEAR CLEAR   Specific Gravity, Urine 1.015 1.005 - 1.030   pH 6.0 5.0 - 8.0   Glucose, UA NEGATIVE NEGATIVE mg/dL   Hgb urine dipstick NEGATIVE NEGATIVE   Bilirubin Urine NEGATIVE  NEGATIVE   Ketones, ur NEGATIVE NEGATIVE mg/dL   Protein, ur NEGATIVE NEGATIVE mg/dL   Urobilinogen, UA 0.2 0.0 - 1.0 mg/dL   Nitrite NEGATIVE NEGATIVE   Leukocytes, UA NEGATIVE NEGATIVE  Pregnancy, urine POC     Status: None   Collection Time: 11/06/14  8:10 AM  Result Value Ref Range   Preg Test, Ur NEGATIVE NEGATIVE  Wet prep, genital     Status: Abnormal   Collection Time: 11/06/14  8:47 AM  Result Value Ref Range   Yeast Wet Prep HPF POC NONE SEEN NONE SEEN   Trich, Wet Prep NONE SEEN NONE SEEN   Clue Cells Wet Prep HPF POC NONE SEEN NONE SEEN   WBC, Wet Prep HPF POC RARE (A) NONE SEEN    Imaging:  N/A  Assessment and Plan  A:    G3P3003   1. Vaginal candida       P:  Diflucan 150 mg po x 2 doses  Reviewed medication use  Discharge home  Return to MAU for worsening symptoms or emergencies  Sampson Si, Nurse-midwife student   I have seen this patient and agree with the above midwife student's note.  LEFTWICH-KIRBY, Kinze Labo  11/24/20159:50 AM

## 2014-11-06 NOTE — Discharge Instructions (Signed)

## 2014-11-06 NOTE — MAU Note (Signed)
Patient states she has been having pain and burning with urination for one week. Denies bleeding or discharge.

## 2014-11-06 NOTE — MAU Note (Signed)
Pt states it burns when she urinates and she feels urgency to urinate.  No other complaints at this time.

## 2014-11-07 LAB — GC/CHLAMYDIA PROBE AMP
CT PROBE, AMP APTIMA: NEGATIVE
GC Probe RNA: NEGATIVE

## 2015-09-12 ENCOUNTER — Inpatient Hospital Stay (HOSPITAL_COMMUNITY)
Admission: AD | Admit: 2015-09-12 | Discharge: 2015-09-12 | Disposition: A | Discharge status: Home / Self Care | Payer: Medicaid Other | Source: Ambulatory Visit | Attending: Obstetrics | Admitting: Obstetrics

## 2015-09-12 ENCOUNTER — Encounter (HOSPITAL_COMMUNITY): Payer: Self-pay | Admitting: *Deleted

## 2015-09-12 DIAGNOSIS — N898 Other specified noninflammatory disorders of vagina: Secondary | ICD-10-CM | POA: Diagnosis not present

## 2015-09-12 DIAGNOSIS — B373 Candidiasis of vulva and vagina: Secondary | ICD-10-CM | POA: Insufficient documentation

## 2015-09-12 DIAGNOSIS — F17200 Nicotine dependence, unspecified, uncomplicated: Secondary | ICD-10-CM | POA: Insufficient documentation

## 2015-09-12 DIAGNOSIS — B3731 Acute candidiasis of vulva and vagina: Secondary | ICD-10-CM

## 2015-09-12 LAB — POCT PREGNANCY, URINE: Preg Test, Ur: NEGATIVE

## 2015-09-12 LAB — URINALYSIS, ROUTINE W REFLEX MICROSCOPIC
BILIRUBIN URINE: NEGATIVE
GLUCOSE, UA: NEGATIVE mg/dL
HGB URINE DIPSTICK: NEGATIVE
KETONES UR: NEGATIVE mg/dL
Leukocytes, UA: NEGATIVE
Nitrite: NEGATIVE
PROTEIN: NEGATIVE mg/dL
Specific Gravity, Urine: >1.03 — ABNORMAL HIGH (ref 1.005–1.030)
Urobilinogen, UA: 0.2 mg/dL (ref 0.0–1.0)
pH: 5.5 (ref 5.0–8.0)

## 2015-09-12 LAB — WET PREP, GENITAL
CLUE CELLS WET PREP: NONE SEEN
Trich, Wet Prep: NONE SEEN
Yeast Wet Prep HPF POC: NONE SEEN

## 2015-09-12 MED ORDER — FLUCONAZOLE 150 MG PO TABS
150.0000 mg | ORAL_TABLET | Freq: Once | ORAL | Status: AC
  Administered 2015-09-12: 150 mg via ORAL
  Filled 2015-09-12: qty 1

## 2015-09-12 NOTE — MAU Provider Note (Signed)
History     CSN: [id number]  Arrival date and time: 09/12/15 1046   None     No chief complaint on file.  HPI   [name] is a 44 y.o. female G3P3003 at Unknown presneing to MAU with vaginal discharge. Symptoms have been present for 3 days. The discharge is thick and white and she has itching all around her vagina.   She has used Monistat 3 day in the last 24 hours. She completed this treatment. She has no new partners, however does want to be tested for STI's.   OB History    Gravida Para Term Preterm AB TAB SAB Ectopic Multiple Living   3 3 3       3       Past Medical History  Diagnosis Date  . Breast calcification, right   . Depression   . Anxiety     Past Surgical History  Procedure Laterality Date  . Abdominal surgery    . Bladder surgery    . Irrigation and debridement abscess Right 09/10/2014    Procedure: IRRIGATION AND DEBRIDEMENT RIGHT BREAST  ABSCESS;  Surgeon: Gayland Curry, MD;  Location: [facility];  Service: General;  Laterality: Right;  . Appendectomy    . Wisdom tooth extraction      Family History  Problem Relation Age of Onset  . Alcohol abuse Neg Hx   . Arthritis Neg Hx   . Asthma Neg Hx   . Birth defects Neg Hx   . Cancer Neg Hx   . COPD Neg Hx   . Depression Neg Hx   . Drug abuse Neg Hx   . Early death Neg Hx   . Hearing loss Neg Hx   . Heart disease Neg Hx   . Hyperlipidemia Neg Hx   . Hypertension Neg Hx   . Kidney disease Neg Hx   . Learning disabilities Neg Hx   . Mental illness Neg Hx   . Mental retardation Neg Hx   . Miscarriages / Stillbirths Neg Hx   . Stroke Neg Hx   . Vision loss Neg Hx   . Diabetes Father   . Breast cancer Sister     Social History  Substance Use Topics  . Smoking status: Current Every Day Smoker -- 1.50 packs/day for 7 years    Types: Cigarettes  . Smokeless tobacco: Never Used  . Alcohol Use: 1.2 oz/week    2 Cans of beer per week     Comment: rarely    Allergies:  Allergies  Allergen  Reactions  . Penicillins Nausea And Vomiting and Rash    Prescriptions prior to admission  Medication Sig Dispense Refill Last Dose  . Multiple Vitamin (MULTIVITAMIN WITH MINERALS) TABS tablet Take 1 tablet by mouth daily.   09/11/2015 at Unknown time  . acetaminophen (TYLENOL) 325 MG tablet Take 2 tablets (650 mg total) by mouth every 6 (six) hours as needed for mild pain (or Temp > 100). (Patient not taking: Reported on 11/06/2014)     . albuterol (PROVENTIL HFA;VENTOLIN HFA) 108 (90 BASE) MCG/ACT inhaler Inhale 2 puffs into the lungs every 6 (six) hours as needed for wheezing or shortness of breath.   rescue    Results for orders placed or performed during the hospital encounter of 09/12/15 (from the past 48 hour(s))  Urinalysis, Routine w reflex microscopic (not at [facility])     Status: Abnormal   Collection Time: 09/12/15 11:05 AM  Result Value Ref Range  Color, Urine YELLOW YELLOW   APPearance HAZY (A) CLEAR   Specific Gravity, Urine >1.030 (H) 1.005 - 1.030   pH 5.5 5.0 - 8.0   Glucose, UA NEGATIVE NEGATIVE mg/dL   Hgb urine dipstick NEGATIVE NEGATIVE   Bilirubin Urine NEGATIVE NEGATIVE   Ketones, ur NEGATIVE NEGATIVE mg/dL   Protein, ur NEGATIVE NEGATIVE mg/dL   Urobilinogen, UA 0.2 0.0 - 1.0 mg/dL   Nitrite NEGATIVE NEGATIVE   Leukocytes, UA NEGATIVE NEGATIVE    Comment: MICROSCOPIC NOT DONE ON URINES WITH NEGATIVE PROTEIN, BLOOD, LEUKOCYTES, NITRITE, OR GLUCOSE <1000 mg/dL.  Pregnancy, urine POC     Status: None   Collection Time: 09/12/15 11:07 AM  Result Value Ref Range   Preg Test, Ur NEGATIVE NEGATIVE    Comment:        THE SENSITIVITY OF THIS METHODOLOGY IS >24 mIU/mL   Wet prep, genital     Status: Abnormal   Collection Time: 09/12/15 11:50 AM  Result Value Ref Range   Yeast Wet Prep HPF POC NONE SEEN NONE SEEN   Trich, Wet Prep NONE SEEN NONE SEEN   Clue Cells Wet Prep HPF POC NONE SEEN NONE SEEN   WBC, Wet Prep HPF POC FEW (A) NONE SEEN    Comment: MODERATE  BACTERIA SEEN     Review of Systems  Constitutional: Negative for fever and chills.  Gastrointestinal: Negative for nausea, vomiting, abdominal pain, diarrhea and constipation.   Physical Exam   Blood pressure 143/87, pulse 58, temperature 97.8 F (36.6 C), temperature source Oral, resp. rate 16, height 5\' 5"  (7.035 m), weight 79.379 kg (175 lb), last menstrual period 08/15/2015.  Physical Exam  Constitutional: She is oriented to person, place, and time. She appears well-developed and well-nourished. No distress.  HENT:  Head: Normocephalic.  Eyes: Pupils are equal, round, and reactive to light.  Neck: Neck supple.  GI: Soft. She exhibits no distension. There is no tenderness. There is no rebound.  Genitourinary:  Speculum exam: Vagina - Small amount of creamy, thick discharge, no odor Cervix - No contact bleeding, + friable cervix  Bimanual exam: Cervix closed, no CMT  Uterus non tender, normal size Adnexa non tender, no masses bilaterally GC/Chlam, wet prep done Chaperone present for exam.  Musculoskeletal: Normal range of motion.  Neurological: She is alert and oriented to person, place, and time.  Skin: Skin is warm. She is not diaphoretic.  Psychiatric: Her behavior is normal.    MAU Course  Procedures  None  MDM  Diflucan given in MAU  Assessment and Plan   A:  1. Vaginal discharge   2. Yeast vaginitis    P:  Discharge home in stable condition Explained that if we may not have been able to collect a good wet prep specimen due to monistat use. I will treat as a yeast infection, however if symptoms persist she may need testing when the monistat is out of her vagina.  Follow up with Dr. Ruthann Cancer as needed.   Lezlie Lye, NP 09/12/2015  12:28 PM

## 2015-09-12 NOTE — MAU Note (Signed)
Pt experiencing vag itching and irritation for the past five days.  Tried monistat 3 day and still having symptoms.  Wants to be checked for STD testing as well.  Not using any birth control.

## 2015-09-12 NOTE — Discharge Instructions (Signed)

## 2015-09-13 LAB — GC/CHLAMYDIA PROBE AMP (~~LOC~~) NOT AT ARMC
CHLAMYDIA, DNA PROBE: NEGATIVE
NEISSERIA GONORRHEA: NEGATIVE

## 2015-12-05 ENCOUNTER — Ambulatory Visit: Payer: Medicaid Other | Admitting: Internal Medicine

## 2015-12-15 DIAGNOSIS — M5020 Other cervical disc displacement, unspecified cervical region: Secondary | ICD-10-CM

## 2015-12-15 HISTORY — DX: Other cervical disc displacement, unspecified cervical region: M50.20

## 2016-01-16 ENCOUNTER — Ambulatory Visit (INDEPENDENT_AMBULATORY_CARE_PROVIDER_SITE_OTHER): Payer: Medicaid Other | Admitting: Family Medicine

## 2016-01-16 ENCOUNTER — Encounter: Payer: Self-pay | Admitting: Family Medicine

## 2016-01-16 VITALS — BP 124/84 | HR 81 | Temp 98.5°F | Resp 16 | Ht 65.0 in | Wt 180.0 lb

## 2016-01-16 DIAGNOSIS — L700 Acne vulgaris: Secondary | ICD-10-CM | POA: Diagnosis not present

## 2016-01-16 DIAGNOSIS — M25579 Pain in unspecified ankle and joints of unspecified foot: Secondary | ICD-10-CM

## 2016-01-16 DIAGNOSIS — Z0000 Encounter for general adult medical examination without abnormal findings: Secondary | ICD-10-CM | POA: Diagnosis not present

## 2016-01-16 DIAGNOSIS — L659 Nonscarring hair loss, unspecified: Secondary | ICD-10-CM | POA: Diagnosis not present

## 2016-01-16 LAB — CBC WITH DIFFERENTIAL/PLATELET
BASOS ABS: 0 10*3/uL (ref 0.0–0.1)
BASOS PCT: 0 % (ref 0–1)
EOS ABS: 0.1 10*3/uL (ref 0.0–0.7)
Eosinophils Relative: 1 % (ref 0–5)
HCT: 39.6 % (ref 36.0–46.0)
HEMOGLOBIN: 12.9 g/dL (ref 12.0–15.0)
Lymphocytes Relative: 30 % (ref 12–46)
Lymphs Abs: 2.1 10*3/uL (ref 0.7–4.0)
MCH: 29 pg (ref 26.0–34.0)
MCHC: 32.6 g/dL (ref 30.0–36.0)
MCV: 89 fL (ref 78.0–100.0)
MPV: 8.7 fL (ref 8.6–12.4)
Monocytes Absolute: 0.6 10*3/uL (ref 0.1–1.0)
Monocytes Relative: 8 % (ref 3–12)
NEUTROS ABS: 4.2 10*3/uL (ref 1.7–7.7)
NEUTROS PCT: 61 % (ref 43–77)
PLATELETS: 407 10*3/uL — AB (ref 150–400)
RBC: 4.45 MIL/uL (ref 3.87–5.11)
RDW: 15.7 % — ABNORMAL HIGH (ref 11.5–15.5)
WBC: 6.9 10*3/uL (ref 4.0–10.5)

## 2016-01-16 LAB — COMPLETE METABOLIC PANEL WITH GFR
ALT: 7 U/L (ref 6–29)
AST: 15 U/L (ref 10–30)
Albumin: 4.1 g/dL (ref 3.6–5.1)
Alkaline Phosphatase: 73 U/L (ref 33–115)
BUN: 8 mg/dL (ref 7–25)
CALCIUM: 8.7 mg/dL (ref 8.6–10.2)
CHLORIDE: 108 mmol/L (ref 98–110)
CO2: 21 mmol/L (ref 20–31)
CREATININE: 0.86 mg/dL (ref 0.50–1.10)
GFR, Est Non African American: 83 mL/min (ref >=60)
Glucose, Bld: 88 mg/dL (ref 65–99)
POTASSIUM: 3.9 mmol/L (ref 3.5–5.3)
Sodium: 137 mmol/L (ref 135–146)
Total Bilirubin: 0.5 mg/dL (ref 0.2–1.2)
Total Protein: 6.9 g/dL (ref 6.1–8.1)

## 2016-01-16 LAB — HEMOGLOBIN A1C
HEMOGLOBIN A1C: 5.4 % (ref <5.7)
MEAN PLASMA GLUCOSE: 108 mg/dL (ref <117)

## 2016-01-16 LAB — LIPID PANEL
CHOL/HDL RATIO: 3.4 ratio (ref <=5.0)
CHOLESTEROL: 189 mg/dL (ref 125–200)
HDL: 55 mg/dL (ref >=46)
LDL Cholesterol: 118 mg/dL (ref <130)
TRIGLYCERIDES: 80 mg/dL (ref <150)
VLDL: 16 mg/dL (ref <30)

## 2016-01-16 LAB — TSH: TSH: 1.217 u[IU]/mL (ref 0.350–4.500)

## 2016-01-16 NOTE — Progress Notes (Signed)
Patient ID: Jasmin Stephenson, female   DOB: July 13, 1971, 45 y.o.   MRN: [id number]   Jasmin Stephenson, is a 45 y.o. female  [id number]  [id number]  DOB - 07/03/1971  CC:  Chief Complaint  Patient presents with  . Diabetes  . Establish Care    needs thyroid testing.        HPI: Jasmin Stephenson is a 45 y.o. female here to establish care.  She expresses that her last doctor did lab work and did not call with results and recently someone called and said she had diabetes. A review of her labs shows a recorded A1c of 5.4. She is also concerned about thyroid function as she is losing hair along her front hairline. She is wearing her hair pulled back tightly. She also request a referral to a podiatrist for 'bone spurs". She also suffers with cystic acne and lots of black heads and request a referral to dermatology. She has a history of being on medication for depression but not currently. She is currently exploring the options for mental health.  She reports smoking 1 1/2 packs of cigarettes daily. She is not willing to consider stopping at this time. She has a history of childhood asthma and experiences some coughing (probably related to smoking).  She is on no chronic medications currently. She declines flu vaccine and Tdap. She is in need of a mammogram. She will need a PAP in May.   Allergies  Allergen Reactions  . Penicillins Nausea And Vomiting and Rash   Past Medical History  Diagnosis Date  . Breast calcification, right   . Depression   . Anxiety    Current Outpatient Prescriptions on File Prior to Visit  Medication Sig Dispense Refill  . acetaminophen (TYLENOL) 325 MG tablet Take 2 tablets (650 mg total) by mouth every 6 (six) hours as needed for mild pain (or Temp > 100). (Patient not taking: Reported on 11/06/2014)    . albuterol (PROVENTIL HFA;VENTOLIN HFA) 108 (90 BASE) MCG/ACT inhaler Inhale 2 puffs into the lungs every 6 (six) hours as needed for wheezing or shortness of breath.  Reported on 01/16/2016    . Multiple Vitamin (MULTIVITAMIN WITH MINERALS) TABS tablet Take 1 tablet by mouth daily. Reported on 01/16/2016     No current facility-administered medications on file prior to visit.   Family History  Problem Relation Age of Onset  . Alcohol abuse Neg Hx   . Arthritis Neg Hx   . Asthma Neg Hx   . Birth defects Neg Hx   . Cancer Neg Hx   . COPD Neg Hx   . Depression Neg Hx   . Drug abuse Neg Hx   . Early death Neg Hx   . Hearing loss Neg Hx   . Heart disease Neg Hx   . Hyperlipidemia Neg Hx   . Hypertension Neg Hx   . Kidney disease Neg Hx   . Learning disabilities Neg Hx   . Mental illness Neg Hx   . Mental retardation Neg Hx   . Miscarriages / Stillbirths Neg Hx   . Stroke Neg Hx   . Vision loss Neg Hx   . Diabetes Father   . Breast cancer Sister    Social History   Social History  . Marital Status: Single    Spouse Name: N/A  . Number of Children: N/A  . Years of Education: N/A   Occupational History  . Not on file.   Social History Main Topics  .  Smoking status: Current Every Day Smoker -- 1.50 packs/day for 7 years    Types: Cigarettes  . Smokeless tobacco: Never Used  . Alcohol Use: 1.2 oz/week    2 Cans of beer per week     Comment: rarely  . Drug Use: Yes    Special: Marijuana     Comment: last use Aug 15 2015  . Sexual Activity: Yes    Birth Control/ Protection: None     Comment: last intercourse 27th Sep 2016   Other Topics Concern  . Not on file   Social History Narrative    Review of Systems: Constitutional: Negative for fever, chills, appetite change, weight loss,  Fatigue. Skin: Negative for rashes or lesions of concern. HENT: Negative for ear pain, ear discharge.nose bleeds Eyes: Negative for pain, discharge, redness, itching and visual disturbance. Neck: Negative for pain, stiffness Respiratory: Negative for shortness of breath, wheezing. Positive for some coughing probably related to heavy tobacco use.   Cardiovascular: Negative for chest pain, palpitations and leg swelling. Gastrointestinal: Negative for abdominal pain, nausea, vomiting, diarrhea, constipations Genitourinary: Negative for dysuria, urgency, frequency, hematuria,  Musculoskeletal: Positive for back pain,  Hip pain and foot pain, all intermittently. joint pain, joint  swelling, and gait problem.Negative for weakness. Neurological: Negative for dizziness, tremors, seizures, syncope,   light-headedness, numbness and severe or unusal headaches.  Hematological: Negative for easy bruising or bleeding Psychiatric/Behavioral: Positive for depression. Negative for  anxiety, decreased concentration, confusion   Objective:   Filed Vitals:   01/16/16 1113  BP: 124/84  Pulse: 81  Temp: 98.5 F (36.9 C)  Resp: 16    Physical Exam: Constitutional: Patient appears well-developed and well-nourished. No distress. HENT: Normocephalic, atraumatic, External right and left ear normal. Oropharynx is clear and moist.  Eyes: Conjunctivae and EOM are normal. PERRLA, no scleral icterus. Neck: Normal ROM. Neck supple. No lymphadenopathy, No thyromegaly. CVS: RRR, S1/S2 +, no murmurs, no gallops, no rubs Pulmonary: Effort and breath sounds normal, no stridor, rhonchi, wheezes, rales.  Abdominal: Soft. Normoactive BS,, no distension, tenderness, rebound or guarding.  Musculoskeletal: Normal range of motion. No edema and no tenderness.  Neuro: Alert.Normal muscle tone coordination. Non-focal Skin: Skin is warm and dry.  Not diaphoretic. No erythema. No pallor.The feet have multiple calluses. There are several cystic acne lesions on her face and chest. There are many blackheads present on her face. Psychiatric: Normal mood and affect. Behavior, judgment, thought content normal.  Lab Results  Component Value Date   WBC 7.9 09/12/2014   HGB 12.0 09/12/2014   HCT 35.9* 09/12/2014   MCV 89.8 09/12/2014   PLT 351 09/12/2014   Lab Results   Component Value Date   CREATININE 0.88 09/12/2014   BUN 4* 09/12/2014   NA 137 09/12/2014   K 3.8 09/12/2014   CL 101 09/12/2014   CO2 24 09/12/2014    Lab Results  Component Value Date   HGBA1C 5.4 09/10/2014   Lipid Panel  No results found for: CHOL, TRIG, HDL, CHOLHDL, VLDL, LDLCALC     Assessment and plan:   1. Health care maintenance  - COMPLETE METABOLIC PANEL WITH GFR - Hemoglobin A1C - CBC w/Diff - Lipid panel - TSH  2. Pain in joint, ankle and foot, unspecified laterality  - Ambulatory referral to Podiatry  3. Acne cystica  - Ambulatory referral to Dermatology   Return in about 6 months (around 07/15/2016).  The patient was given clear instructions to go to ER or return  to medical center if symptoms don't improve, worsen or new problems develop. The patient verbalized understanding.    Micheline Chapman FNP  01/16/2016, 1:09 PM

## 2016-01-20 NOTE — Progress Notes (Signed)
LM to [facility] 

## 2016-06-13 ENCOUNTER — Encounter (HOSPITAL_COMMUNITY): Payer: Self-pay

## 2016-06-13 ENCOUNTER — Emergency Department (HOSPITAL_COMMUNITY): Payer: Medicaid Other

## 2016-06-13 ENCOUNTER — Emergency Department (HOSPITAL_COMMUNITY)
Admission: EM | Admit: 2016-06-13 | Discharge: 2016-06-13 | Disposition: A | Discharge status: Home / Self Care | Payer: Medicaid Other | Attending: Emergency Medicine | Admitting: Emergency Medicine

## 2016-06-13 DIAGNOSIS — F17210 Nicotine dependence, cigarettes, uncomplicated: Secondary | ICD-10-CM | POA: Insufficient documentation

## 2016-06-13 DIAGNOSIS — M25511 Pain in right shoulder: Secondary | ICD-10-CM | POA: Insufficient documentation

## 2016-06-13 LAB — BASIC METABOLIC PANEL
ANION GAP: 6 (ref 5–15)
BUN: 5 mg/dL — ABNORMAL LOW (ref 6–20)
CALCIUM: 9 mg/dL (ref 8.9–10.3)
CHLORIDE: 106 mmol/L (ref 101–111)
CO2: 24 mmol/L (ref 22–32)
Creatinine, Ser: 0.86 mg/dL (ref 0.44–1.00)
GFR calc non Af Amer: >60 mL/min (ref >60)
GLUCOSE: 98 mg/dL (ref 65–99)
POTASSIUM: 3.6 mmol/L (ref 3.5–5.1)
Sodium: 136 mmol/L (ref 135–145)

## 2016-06-13 LAB — CBC
HEMATOCRIT: 38.5 % (ref 36.0–46.0)
HEMOGLOBIN: 12.7 g/dL (ref 12.0–15.0)
MCH: 29.1 pg (ref 26.0–34.0)
MCHC: 33 g/dL (ref 30.0–36.0)
MCV: 88.3 fL (ref 78.0–100.0)
Platelets: 459 10*3/uL — ABNORMAL HIGH (ref 150–400)
RBC: 4.36 MIL/uL (ref 3.87–5.11)
RDW: 15.3 % (ref 11.5–15.5)
WBC: 6.1 10*3/uL (ref 4.0–10.5)

## 2016-06-13 MED ORDER — NAPROXEN 375 MG PO TABS: 375.0000 mg | ORAL_TABLET | Freq: Two times a day (BID) | ORAL | Status: DC

## 2016-06-13 MED ORDER — NAPROXEN 250 MG PO TABS
375.0000 mg | ORAL_TABLET | Freq: Once | ORAL | Status: AC
  Administered 2016-06-13: 375 mg via ORAL
  Filled 2016-06-13: qty 2

## 2016-06-13 MED ORDER — CYCLOBENZAPRINE HCL 10 MG PO TABS
5.0000 mg | ORAL_TABLET | Freq: Once | ORAL | Status: AC
  Administered 2016-06-13: 5 mg via ORAL
  Filled 2016-06-13: qty 1

## 2016-06-13 NOTE — Discharge Instructions (Signed)

## 2016-06-13 NOTE — ED Provider Notes (Signed)
CSN: [id number]     Arrival date & time 06/13/16  1257 History   None    Chief Complaint  Patient presents with  . Chest Pain     Patient is a 45 y.o. female presenting with shoulder pain. The history is provided by the patient.  Shoulder Pain Location:  Shoulder Time since incident:  2 days Shoulder location:  R shoulder Pain details:    Quality:  Aching   Radiates to:  R arm   Severity:  Moderate   Onset quality:  Gradual   Duration:  2 days   Timing:  Constant   Progression:  Worsening Chronicity:  New Dislocation: no   Associated symptoms: no back pain, no fever and no neck pain     Past Medical History  Diagnosis Date  . Breast calcification, right   . Depression   . Anxiety    Past Surgical History  Procedure Laterality Date  . Abdominal surgery    . Bladder surgery    . Irrigation and debridement abscess Right 09/10/2014    Procedure: IRRIGATION AND DEBRIDEMENT RIGHT BREAST  ABSCESS;  Surgeon: Gayland Curry, MD;  Location: [facility];  Service: General;  Laterality: Right;  . Appendectomy    . Wisdom tooth extraction     Family History  Problem Relation Age of Onset  . Alcohol abuse Neg Hx   . Arthritis Neg Hx   . Asthma Neg Hx   . Birth defects Neg Hx   . Cancer Neg Hx   . COPD Neg Hx   . Depression Neg Hx   . Drug abuse Neg Hx   . Early death Neg Hx   . Hearing loss Neg Hx   . Heart disease Neg Hx   . Hyperlipidemia Neg Hx   . Hypertension Neg Hx   . Kidney disease Neg Hx   . Learning disabilities Neg Hx   . Mental illness Neg Hx   . Mental retardation Neg Hx   . Miscarriages / Stillbirths Neg Hx   . Stroke Neg Hx   . Vision loss Neg Hx   . Diabetes Father   . Breast cancer Sister    Social History  Substance Use Topics  . Smoking status: Current Every Day Smoker -- 1.50 packs/day for 7 years    Types: Cigarettes  . Smokeless tobacco: Never Used  . Alcohol Use: 1.2 oz/week    2 Cans of beer per week     Comment: rarely   OB History    Gravida Para Term Preterm AB TAB SAB Ectopic Multiple Living   3 3 3       3      Review of Systems  Constitutional: Negative for fever and chills.  HENT: Negative for congestion and sore throat.   Eyes: Negative for pain.  Respiratory: Negative for cough and shortness of breath.   Cardiovascular: Negative for chest pain, palpitations and leg swelling.  Gastrointestinal: Negative for nausea, vomiting, abdominal pain and diarrhea.  Genitourinary: Negative for dysuria and flank pain.  Musculoskeletal: Negative for back pain and neck pain.       R shoulder pain   Skin: Negative for rash.  Allergic/Immunologic: Negative.   Neurological: Negative for dizziness and light-headedness.  Psychiatric/Behavioral: Negative for confusion.      Allergies  Penicillins  Home Medications   Prior to Admission medications   Medication Sig Start Date End Date Taking? Authorizing Provider  acetaminophen (TYLENOL) 325 MG tablet Take 2 tablets (  650 mg total) by mouth every 6 (six) hours as needed for mild pain (or Temp > 100). Patient not taking: Reported on 11/06/2014 09/12/14   Erby Pian, NP  albuterol (PROVENTIL HFA;VENTOLIN HFA) 108 (90 BASE) MCG/ACT inhaler Inhale 2 puffs into the lungs every 6 (six) hours as needed for wheezing or shortness of breath. Reported on 01/16/2016    Historical Provider, MD  Multiple Vitamin (MULTIVITAMIN WITH MINERALS) TABS tablet Take 1 tablet by mouth daily. Reported on 01/16/2016    Historical Provider, MD   BP 139/97 mmHg  Pulse 75  Temp(Src) 98.2 F (36.8 C) (Oral)  Resp 22  SpO2 100%  LMP 06/13/2016 (Exact Date) Physical Exam  Constitutional: She is oriented to person, place, and time. She appears well-developed and well-nourished. No distress.  HENT:  Head: Normocephalic and atraumatic.  Eyes: Conjunctivae and EOM are normal. Pupils are equal, round, and reactive to light.  Neck: Normal range of motion. Neck supple.  Cardiovascular: Normal rate, regular  rhythm and normal heart sounds.   Pulmonary/Chest: Effort normal and breath sounds normal. No respiratory distress.  Abdominal: Soft. Bowel sounds are normal. There is no tenderness.  Musculoskeletal: Normal range of motion.       Right shoulder: She exhibits tenderness. She exhibits normal range of motion, no bony tenderness and no swelling.       Arms:      Right hand: Decreased sensation is not present in the ulnar distribution, is not present in the medial distribution and is not present in the radial distribution. She exhibits no finger abduction, no thumb/finger opposition and no wrist extension trouble.  Neurological: She is alert and oriented to person, place, and time. She has normal reflexes. No cranial nerve deficit.  Skin: Skin is warm and dry. She is not diaphoretic.  Psychiatric: She has a normal mood and affect.    ED Course  Procedures (including critical care time) Labs Review Labs Reviewed  BASIC METABOLIC PANEL - Abnormal; Notable for the following:    BUN 5 (*)    All other components within normal limits  CBC - Abnormal; Notable for the following:    Platelets 459 (*)    All other components within normal limits  I-STAT TROPOININ, ED    Imaging Review Dg Chest 2 View  06/13/2016  CLINICAL DATA:  Chest pain EXAM: CHEST  2 VIEW COMPARISON:  119 a low chest radiograph. FINDINGS: Stable cardiomediastinal silhouette with normal heart size. No pneumothorax. No pleural effusion. Mild scarring versus atelectasis at the anterior lung bases. No pulmonary edema. No acute consolidative airspace disease. IMPRESSION: Mild scarring versus atelectasis at the anterior lung bases. Otherwise no active disease in the chest. Electronically Signed   By: Ilona Sorrel M.D.   On: 06/13/2016 13:43   I have personally reviewed and evaluated these images and lab results as part of my medical decision-making.   EKG Interpretation   Date/Time:  Saturday June 13 2016 13:00:44 EDT Ventricular  Rate:  81 PR Interval:  158 QRS Duration: 68 QT Interval:  414 QTC Calculation: 480 R Axis:   -27 Text Interpretation:  Normal sinus rhythm Prolonged QT Abnormal ECG No  significant change since last tracing Confirmed by [facility]  MD, MARTHA  [id number]) on 06/13/2016 2:48:23 PM      MDM   Final diagnoses:  None   The pt is a 45 yo female with a hx of depression and anxiety presenting for right shoulder tenderness radiating into neck and chest.  Reports shooting pain down shoulder and arm.   Ddx radiculopathy, msk pain, ACS, PE.  EKG with no acute ST or T wave changes, Q waves, with NSR and normal rate. Trop negative.  CBC and BMP unremarkable. Wells low risk and PERC negative.  Doubt PE as etiology. CXR with no acute findings.  Completely reproducable with palpation and movement.  Spurling positive.  Likely msk or radiculopathy.  Doubt acute dissection or stroke.   If performed, labs, EKGs, and imaging were reviewed/interpreted by myself and my attending and incorporated into medical decision making.  Discussed pertinent finding with patient or caregiver prior to discharge with no further questions.  Immediate return precautions given and pt or caregiver reports understanding.  Pt care supervised by my attending Dr. Kathrynn Humble.   Geronimo Boot, MD PGY-3 Emergency Medicine   Geronimo Boot, MD 06/14/16 [place], MD 06/14/16 [phone]

## 2016-06-13 NOTE — ED Notes (Signed)
Patient complains of right sided chet pain that is increased with right arm movement x 3 hours, no distress

## 2016-06-15 LAB — I-STAT TROPONIN, ED: TROPONIN I, POC: 0 ng/mL (ref 0.00–0.08)

## 2016-06-17 ENCOUNTER — Telehealth: Payer: Self-pay

## 2016-06-17 NOTE — Telephone Encounter (Signed)
Received a message from the answering service about patient asking for a referral. Called and left message asking patient to call back and clarify this, she may need an appointment first. Thanks!

## 2016-06-25 ENCOUNTER — Ambulatory Visit (HOSPITAL_COMMUNITY)
Admission: EM | Admit: 2016-06-25 | Discharge: 2016-06-25 | Disposition: A | Discharge status: Home / Self Care | Payer: Medicaid Other | Attending: Family Medicine | Admitting: Family Medicine

## 2016-06-25 ENCOUNTER — Encounter (HOSPITAL_COMMUNITY): Payer: Self-pay | Admitting: Emergency Medicine

## 2016-06-25 DIAGNOSIS — M546 Pain in thoracic spine: Secondary | ICD-10-CM

## 2016-06-25 DIAGNOSIS — M549 Dorsalgia, unspecified: Secondary | ICD-10-CM

## 2016-06-25 DIAGNOSIS — M25511 Pain in right shoulder: Secondary | ICD-10-CM | POA: Diagnosis not present

## 2016-06-25 MED ORDER — PREDNISONE 20 MG PO TABS: ORAL_TABLET | ORAL | Status: DC

## 2016-06-25 MED ORDER — IBUPROFEN 600 MG PO TABS: 600.0000 mg | ORAL_TABLET | Freq: Four times a day (QID) | ORAL | Status: DC | PRN

## 2016-06-25 MED ORDER — TRAMADOL HCL 50 MG PO TABS: 50.0000 mg | ORAL_TABLET | Freq: Four times a day (QID) | ORAL | Status: DC | PRN

## 2016-06-25 MED ORDER — METHOCARBAMOL 500 MG PO TABS: 500.0000 mg | ORAL_TABLET | Freq: Two times a day (BID) | ORAL | Status: DC

## 2016-06-25 NOTE — ED Notes (Signed)
The patient presented to the [facility] with a complaint of right shoulder and arm pain and numbness that started June 13, 2016. The patient stated that she has already been evaluated in ED for the same and has tried to follow up with her PCP.

## 2016-06-25 NOTE — Discharge Instructions (Signed)
Tramadol is strong pain medication. While taking, do not drink alcohol, drive, or perform any other activities that requires focus while taking these medications.   Flexeril is a muscle relaxer and may cause drowsiness. Do not drink alcohol, drive, or operate heavy machinery while taking.  You may also take 400-600mg  ibuprofen every 6-8 hours for pain.  Muscle pain does take a few weeks to improve.  You may find relief alternating ice and heat.   You may also find relief using an over the counter TENS unit (transcutaneous electronic nerve stimulator).  Please ask your pharmacist if they sell them at their store, or you can find some online for about $30.
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
## 2016-07-13 ENCOUNTER — Ambulatory Visit (INDEPENDENT_AMBULATORY_CARE_PROVIDER_SITE_OTHER): Payer: Medicaid Other | Admitting: Family Medicine

## 2016-07-13 ENCOUNTER — Encounter: Payer: Self-pay | Admitting: Family Medicine

## 2016-07-13 VITALS — BP 137/79 | Temp 98.5°F | Resp 16 | Ht 65.0 in | Wt 172.0 lb

## 2016-07-13 DIAGNOSIS — M25521 Pain in right elbow: Secondary | ICD-10-CM

## 2016-07-13 DIAGNOSIS — M79621 Pain in right upper arm: Secondary | ICD-10-CM

## 2016-07-13 MED ORDER — TRAMADOL HCL 50 MG PO TABS: 50.0000 mg | ORAL_TABLET | Freq: Four times a day (QID) | ORAL | 0 refills | Status: DC | PRN
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
## 2016-07-23 ENCOUNTER — Ambulatory Visit: Payer: Medicaid Other | Admitting: Family Medicine

## 2016-08-31 ENCOUNTER — Encounter: Payer: Self-pay | Admitting: *Deleted

## 2016-09-10 ENCOUNTER — Other Ambulatory Visit: Payer: Self-pay | Admitting: Neurological Surgery

## 2016-09-18 ENCOUNTER — Encounter (HOSPITAL_COMMUNITY): Payer: Self-pay | Admitting: *Deleted

## 2016-09-18 NOTE — Progress Notes (Signed)
Pt denies SOB, chest pain, and being under the care of a cardiologist. Pt denies having a stress test, echo and cardiac cath. Pt made aware to stop taking  Aspirin, vitamins, fish oil and herbal medications. Do not take any NSAIDs ie: Ibuprofen, Advil, Naproxen, BC and Goody Powder or any medication containing Aspirin. Pt verbalized understanding of all pre-op instructions. 

## 2016-09-18 NOTE — Progress Notes (Signed)
Requested that anesthesia review abnormal EKG.

## 2016-09-21 ENCOUNTER — Ambulatory Visit (HOSPITAL_COMMUNITY): Payer: Medicaid Other | Admitting: Emergency Medicine

## 2016-09-21 ENCOUNTER — Ambulatory Visit (HOSPITAL_COMMUNITY)
Admission: RE | Admit: 2016-09-21 | Discharge: 2016-09-22 | Disposition: A | Discharge status: Home / Self Care | Payer: Medicaid Other | Source: Ambulatory Visit | Attending: Neurological Surgery | Admitting: Neurological Surgery

## 2016-09-21 ENCOUNTER — Encounter (HOSPITAL_COMMUNITY): Payer: Self-pay | Admitting: *Deleted

## 2016-09-21 ENCOUNTER — Ambulatory Visit (HOSPITAL_COMMUNITY): Payer: Medicaid Other

## 2016-09-21 ENCOUNTER — Encounter (HOSPITAL_COMMUNITY)
Admission: RE | Disposition: A | Discharge status: Home / Self Care | Payer: Self-pay | Source: Ambulatory Visit | Attending: Neurological Surgery

## 2016-09-21 DIAGNOSIS — Z880 Allergy status to penicillin: Secondary | ICD-10-CM | POA: Diagnosis not present

## 2016-09-21 DIAGNOSIS — F419 Anxiety disorder, unspecified: Secondary | ICD-10-CM | POA: Insufficient documentation

## 2016-09-21 DIAGNOSIS — F17210 Nicotine dependence, cigarettes, uncomplicated: Secondary | ICD-10-CM | POA: Diagnosis not present

## 2016-09-21 DIAGNOSIS — M50123 Cervical disc disorder at C6-C7 level with radiculopathy: Secondary | ICD-10-CM | POA: Diagnosis present

## 2016-09-21 DIAGNOSIS — F1290 Cannabis use, unspecified, uncomplicated: Secondary | ICD-10-CM | POA: Diagnosis not present

## 2016-09-21 DIAGNOSIS — M1990 Unspecified osteoarthritis, unspecified site: Secondary | ICD-10-CM | POA: Insufficient documentation

## 2016-09-21 DIAGNOSIS — J45909 Unspecified asthma, uncomplicated: Secondary | ICD-10-CM | POA: Diagnosis not present

## 2016-09-21 DIAGNOSIS — K219 Gastro-esophageal reflux disease without esophagitis: Secondary | ICD-10-CM | POA: Insufficient documentation

## 2016-09-21 DIAGNOSIS — Z6828 Body mass index (BMI) 28.0-28.9, adult: Secondary | ICD-10-CM | POA: Insufficient documentation

## 2016-09-21 DIAGNOSIS — F329 Major depressive disorder, single episode, unspecified: Secondary | ICD-10-CM | POA: Diagnosis not present

## 2016-09-21 DIAGNOSIS — I252 Old myocardial infarction: Secondary | ICD-10-CM | POA: Insufficient documentation

## 2016-09-21 DIAGNOSIS — E669 Obesity, unspecified: Secondary | ICD-10-CM | POA: Insufficient documentation

## 2016-09-21 DIAGNOSIS — Z419 Encounter for procedure for purposes other than remedying health state, unspecified: Secondary | ICD-10-CM

## 2016-09-21 DIAGNOSIS — I2510 Atherosclerotic heart disease of native coronary artery without angina pectoris: Secondary | ICD-10-CM | POA: Diagnosis not present

## 2016-09-21 HISTORY — PX: CERVICAL DISC ARTHROPLASTY: SHX587

## 2016-09-21 HISTORY — DX: Other cervical disc displacement, unspecified cervical region: M50.20

## 2016-09-21 HISTORY — DX: Unspecified osteoarthritis, unspecified site: M19.90

## 2016-09-21 HISTORY — DX: Gastro-esophageal reflux disease without esophagitis: K21.9

## 2016-09-21 HISTORY — DX: Acute myocardial infarction, unspecified: I21.9

## 2016-09-21 HISTORY — DX: Unspecified asthma, uncomplicated: J45.909

## 2016-09-21 LAB — BASIC METABOLIC PANEL
Anion gap: 8 (ref 5–15)
BUN: 9 mg/dL (ref 6–20)
CHLORIDE: 110 mmol/L (ref 101–111)
CO2: 20 mmol/L — AB (ref 22–32)
Calcium: 9.4 mg/dL (ref 8.9–10.3)
Creatinine, Ser: 0.91 mg/dL (ref 0.44–1.00)
GFR calc Af Amer: >60 mL/min (ref >60)
GFR calc non Af Amer: >60 mL/min (ref >60)
GLUCOSE: 92 mg/dL (ref 65–99)
POTASSIUM: 4 mmol/L (ref 3.5–5.1)
Sodium: 138 mmol/L (ref 135–145)

## 2016-09-21 LAB — CBC
HCT: 41 % (ref 36.0–46.0)
HEMOGLOBIN: 13.3 g/dL (ref 12.0–15.0)
MCH: 28.9 pg (ref 26.0–34.0)
MCHC: 32.4 g/dL (ref 30.0–36.0)
MCV: 89.1 fL (ref 78.0–100.0)
Platelets: 397 10*3/uL (ref 150–400)
RBC: 4.6 MIL/uL (ref 3.87–5.11)
RDW: 15.7 % — ABNORMAL HIGH (ref 11.5–15.5)
WBC: 7.4 10*3/uL (ref 4.0–10.5)

## 2016-09-21 LAB — SURGICAL PCR SCREEN
MRSA, PCR: NEGATIVE
Staphylococcus aureus: NEGATIVE

## 2016-09-21 LAB — HCG, SERUM, QUALITATIVE: Preg, Serum: NEGATIVE

## 2016-09-21 SURGERY — CERVICAL ANTERIOR DISC ARTHROPLASTY
Anesthesia: General | Site: Spine Cervical

## 2016-09-21 MED ORDER — CHLORHEXIDINE GLUCONATE CLOTH 2 % EX PADS: 6.0000 | MEDICATED_PAD | Freq: Once | CUTANEOUS | Status: DC

## 2016-09-21 MED ORDER — PHENYLEPHRINE HCL 10 MG/ML IJ SOLN
INTRAVENOUS | Status: DC | PRN
  Administered 2016-09-21: 20 ug/min via INTRAVENOUS

## 2016-09-21 MED ORDER — BUPIVACAINE-EPINEPHRINE (PF) 0.5% -1:200000 IJ SOLN
INTRAMUSCULAR | Status: DC | PRN
  Administered 2016-09-21: 5 mL

## 2016-09-21 MED ORDER — THROMBIN 5000 UNITS EX SOLR
OROMUCOSAL | Status: DC | PRN
  Administered 2016-09-21: 11:00:00 via TOPICAL

## 2016-09-21 MED ORDER — DIPHENHYDRAMINE HCL 50 MG/ML IJ SOLN
INTRAMUSCULAR | Status: AC
  Filled 2016-09-21: qty 1

## 2016-09-21 MED ORDER — PHENYLEPHRINE HCL 10 MG/ML IJ SOLN
INTRAMUSCULAR | Status: DC | PRN
  Administered 2016-09-21 (×2): 80 ug via INTRAVENOUS

## 2016-09-21 MED ORDER — ALBUTEROL SULFATE (2.5 MG/3ML) 0.083% IN NEBU: 2.5000 mg | INHALATION_SOLUTION | Freq: Four times a day (QID) | RESPIRATORY_TRACT | Status: DC | PRN

## 2016-09-21 MED ORDER — BISACODYL 10 MG RE SUPP: 10.0000 mg | Freq: Every day | RECTAL | Status: DC | PRN

## 2016-09-21 MED ORDER — MIDAZOLAM HCL 5 MG/5ML IJ SOLN
INTRAMUSCULAR | Status: DC | PRN
  Administered 2016-09-21: 2 mg via INTRAVENOUS

## 2016-09-21 MED ORDER — KETAMINE HCL-SODIUM CHLORIDE 100-0.9 MG/10ML-% IV SOSY
PREFILLED_SYRINGE | INTRAVENOUS | Status: AC
  Filled 2016-09-21: qty 10

## 2016-09-21 MED ORDER — SODIUM CHLORIDE 0.9 % IR SOLN
Status: DC | PRN
  Administered 2016-09-21: 11:00:00

## 2016-09-21 MED ORDER — MIDAZOLAM HCL 2 MG/2ML IJ SOLN
INTRAMUSCULAR | Status: AC
  Filled 2016-09-21: qty 2

## 2016-09-21 MED ORDER — DEXAMETHASONE SODIUM PHOSPHATE 10 MG/ML IJ SOLN
INTRAMUSCULAR | Status: AC
  Filled 2016-09-21: qty 1

## 2016-09-21 MED ORDER — NEOSTIGMINE METHYLSULFATE 10 MG/10ML IV SOLN
INTRAVENOUS | Status: DC | PRN
  Administered 2016-09-21: 3 mg via INTRAVENOUS

## 2016-09-21 MED ORDER — ACETAMINOPHEN 500 MG PO TABS
1000.0000 mg | ORAL_TABLET | Freq: Four times a day (QID) | ORAL | Status: DC
  Administered 2016-09-21 – 2016-09-22 (×3): 1000 mg via ORAL
  Filled 2016-09-21 (×3): qty 2

## 2016-09-21 MED ORDER — SODIUM CHLORIDE 0.9% FLUSH: 3.0000 mL | INTRAVENOUS | Status: DC | PRN

## 2016-09-21 MED ORDER — SODIUM CHLORIDE 0.9 % IV SOLN: 250.0000 mL | INTRAVENOUS | Status: DC

## 2016-09-21 MED ORDER — LIDOCAINE 2% (20 MG/ML) 5 ML SYRINGE
INTRAMUSCULAR | Status: DC | PRN
  Administered 2016-09-21: 60 mg via INTRAVENOUS

## 2016-09-21 MED ORDER — ONDANSETRON HCL 4 MG/2ML IJ SOLN: 4.0000 mg | INTRAMUSCULAR | Status: DC | PRN

## 2016-09-21 MED ORDER — LIDOCAINE-EPINEPHRINE 2 %-1:100000 IJ SOLN
INTRAMUSCULAR | Status: DC | PRN
  Administered 2016-09-21: 5 mL via INTRADERMAL

## 2016-09-21 MED ORDER — DIPHENHYDRAMINE HCL 50 MG/ML IJ SOLN
INTRAMUSCULAR | Status: DC | PRN
  Administered 2016-09-21: 12.5 mg via INTRAVENOUS

## 2016-09-21 MED ORDER — FENTANYL CITRATE (PF) 100 MCG/2ML IJ SOLN
INTRAMUSCULAR | Status: AC
  Filled 2016-09-21: qty 4

## 2016-09-21 MED ORDER — KETAMINE HCL 10 MG/ML IJ SOLN
INTRAMUSCULAR | Status: DC | PRN
  Administered 2016-09-21: 30 mg via INTRAVENOUS

## 2016-09-21 MED ORDER — HYDROCODONE-ACETAMINOPHEN 7.5-325 MG PO TABS: 1.0000 | ORAL_TABLET | Freq: Four times a day (QID) | ORAL | 0 refills | Status: DC | PRN

## 2016-09-21 MED ORDER — OXYCODONE HCL 5 MG/5ML PO SOLN: 5.0000 mg | Freq: Once | ORAL | Status: DC | PRN

## 2016-09-21 MED ORDER — THROMBIN 5000 UNITS EX SOLR
CUTANEOUS | Status: AC
  Filled 2016-09-21: qty 15000

## 2016-09-21 MED ORDER — THROMBIN 5000 UNITS EX SOLR
CUTANEOUS | Status: DC | PRN
  Administered 2016-09-21: 10000 [IU] via TOPICAL

## 2016-09-21 MED ORDER — OXYCODONE HCL ER 10 MG PO T12A
20.0000 mg | EXTENDED_RELEASE_TABLET | Freq: Two times a day (BID) | ORAL | Status: DC
  Administered 2016-09-21 – 2016-09-22 (×3): 20 mg via ORAL
  Filled 2016-09-21 (×3): qty 2

## 2016-09-21 MED ORDER — MUPIROCIN 2 % EX OINT
TOPICAL_OINTMENT | CUTANEOUS | Status: AC
  Administered 2016-09-21: 1 via TOPICAL
  Filled 2016-09-21: qty 22

## 2016-09-21 MED ORDER — MENTHOL 3 MG MT LOZG: 1.0000 | LOZENGE | OROMUCOSAL | Status: DC | PRN

## 2016-09-21 MED ORDER — LACTATED RINGERS IV SOLN
INTRAVENOUS | Status: DC
  Administered 2016-09-21 (×2): via INTRAVENOUS

## 2016-09-21 MED ORDER — SODIUM CHLORIDE 0.9% FLUSH
3.0000 mL | Freq: Two times a day (BID) | INTRAVENOUS | Status: DC
  Administered 2016-09-21: 3 mL via INTRAVENOUS

## 2016-09-21 MED ORDER — HEMOSTATIC AGENTS (NO CHARGE) OPTIME
TOPICAL | Status: DC | PRN
  Administered 2016-09-21: 1 via TOPICAL

## 2016-09-21 MED ORDER — ROCURONIUM BROMIDE 10 MG/ML (PF) SYRINGE
PREFILLED_SYRINGE | INTRAVENOUS | Status: DC | PRN
  Administered 2016-09-21: 50 mg via INTRAVENOUS
  Administered 2016-09-21: 20 mg via INTRAVENOUS

## 2016-09-21 MED ORDER — GLYCOPYRROLATE 0.2 MG/ML IJ SOLN
INTRAMUSCULAR | Status: DC | PRN
  Administered 2016-09-21: 0.4 mg via INTRAVENOUS

## 2016-09-21 MED ORDER — SCOPOLAMINE 1 MG/3DAYS TD PT72
MEDICATED_PATCH | TRANSDERMAL | Status: AC
  Administered 2016-09-21: 1.5 mg via OTIC
  Filled 2016-09-21: qty 1

## 2016-09-21 MED ORDER — METOPROLOL TARTRATE 5 MG/5ML IV SOLN
INTRAVENOUS | Status: AC
  Filled 2016-09-21: qty 5

## 2016-09-21 MED ORDER — VANCOMYCIN HCL IN DEXTROSE 1-5 GM/200ML-% IV SOLN
1000.0000 mg | Freq: Once | INTRAVENOUS | Status: AC
  Administered 2016-09-21: 1000 mg via INTRAVENOUS
  Filled 2016-09-21: qty 200

## 2016-09-21 MED ORDER — MUPIROCIN 2 % EX OINT
1.0000 "application " | TOPICAL_OINTMENT | Freq: Once | CUTANEOUS | Status: AC
  Administered 2016-09-21: 1 via TOPICAL

## 2016-09-21 MED ORDER — GABAPENTIN 300 MG PO CAPS: 300.0000 mg | ORAL_CAPSULE | Freq: Three times a day (TID) | ORAL | 2 refills | Status: DC

## 2016-09-21 MED ORDER — FENTANYL CITRATE (PF) 100 MCG/2ML IJ SOLN
INTRAMUSCULAR | Status: DC | PRN
  Administered 2016-09-21 (×6): 50 ug via INTRAVENOUS

## 2016-09-21 MED ORDER — BUPIVACAINE-EPINEPHRINE (PF) 0.5% -1:200000 IJ SOLN
INTRAMUSCULAR | Status: AC
  Filled 2016-09-21: qty 30

## 2016-09-21 MED ORDER — METHOCARBAMOL 750 MG PO TABS: 750.0000 mg | ORAL_TABLET | Freq: Four times a day (QID) | ORAL | 2 refills | Status: DC | PRN

## 2016-09-21 MED ORDER — GABAPENTIN 300 MG PO CAPS
300.0000 mg | ORAL_CAPSULE | Freq: Three times a day (TID) | ORAL | Status: DC
  Administered 2016-09-21 – 2016-09-22 (×3): 300 mg via ORAL
  Filled 2016-09-21 (×3): qty 1

## 2016-09-21 MED ORDER — PROPOFOL 10 MG/ML IV BOLUS
INTRAVENOUS | Status: DC | PRN
  Administered 2016-09-21: 170 mg via INTRAVENOUS

## 2016-09-21 MED ORDER — DOCUSATE SODIUM 100 MG PO CAPS
100.0000 mg | ORAL_CAPSULE | Freq: Two times a day (BID) | ORAL | Status: DC
  Administered 2016-09-21 – 2016-09-22 (×3): 100 mg via ORAL
  Filled 2016-09-21 (×3): qty 1

## 2016-09-21 MED ORDER — DEXAMETHASONE SODIUM PHOSPHATE 10 MG/ML IJ SOLN
INTRAMUSCULAR | Status: DC | PRN
  Administered 2016-09-21: 10 mg via INTRAVENOUS

## 2016-09-21 MED ORDER — ROCURONIUM BROMIDE 10 MG/ML (PF) SYRINGE
PREFILLED_SYRINGE | INTRAVENOUS | Status: AC
  Filled 2016-09-21: qty 10

## 2016-09-21 MED ORDER — OXYCODONE HCL 5 MG PO TABS: 5.0000 mg | ORAL_TABLET | Freq: Once | ORAL | Status: DC | PRN

## 2016-09-21 MED ORDER — ALBUTEROL SULFATE HFA 108 (90 BASE) MCG/ACT IN AERS: 2.0000 | INHALATION_SPRAY | Freq: Four times a day (QID) | RESPIRATORY_TRACT | Status: DC | PRN

## 2016-09-21 MED ORDER — 0.9 % SODIUM CHLORIDE (POUR BTL) OPTIME
TOPICAL | Status: DC | PRN
  Administered 2016-09-21: 1000 mL

## 2016-09-21 MED ORDER — METOPROLOL TARTRATE 5 MG/5ML IV SOLN
INTRAVENOUS | Status: DC | PRN
  Administered 2016-09-21: 1 mg via INTRAVENOUS

## 2016-09-21 MED ORDER — HYDROMORPHONE HCL 1 MG/ML IJ SOLN
0.2500 mg | INTRAMUSCULAR | Status: DC | PRN
  Administered 2016-09-21: 0.5 mg via INTRAVENOUS

## 2016-09-21 MED ORDER — ACETAMINOPHEN 325 MG PO TABS: 650.0000 mg | ORAL_TABLET | Freq: Four times a day (QID) | ORAL | Status: DC | PRN

## 2016-09-21 MED ORDER — KETOROLAC TROMETHAMINE 30 MG/ML IJ SOLN
INTRAMUSCULAR | Status: AC
  Filled 2016-09-21: qty 1

## 2016-09-21 MED ORDER — HYDROMORPHONE HCL 1 MG/ML IJ SOLN
INTRAMUSCULAR | Status: AC
  Filled 2016-09-21: qty 1

## 2016-09-21 MED ORDER — CEFAZOLIN IN D5W 1 GM/50ML IV SOLN: 1.0000 g | Freq: Three times a day (TID) | INTRAVENOUS | Status: DC

## 2016-09-21 MED ORDER — ZOLPIDEM TARTRATE 5 MG PO TABS: 5.0000 mg | ORAL_TABLET | Freq: Every evening | ORAL | Status: DC | PRN

## 2016-09-21 MED ORDER — ONDANSETRON HCL 4 MG/2ML IJ SOLN
INTRAMUSCULAR | Status: DC | PRN
  Administered 2016-09-21: 4 mg via INTRAVENOUS

## 2016-09-21 MED ORDER — PHENOL 1.4 % MT LIQD: 1.0000 | OROMUCOSAL | Status: DC | PRN

## 2016-09-21 MED ORDER — FENTANYL CITRATE (PF) 100 MCG/2ML IJ SOLN
INTRAMUSCULAR | Status: AC
  Filled 2016-09-21: qty 2

## 2016-09-21 MED ORDER — SODIUM CHLORIDE 0.9 % IV SOLN: INTRAVENOUS | Status: DC

## 2016-09-21 MED ORDER — ONDANSETRON HCL 4 MG/2ML IJ SOLN: 4.0000 mg | Freq: Four times a day (QID) | INTRAMUSCULAR | Status: DC | PRN

## 2016-09-21 MED ORDER — LIDOCAINE 2% (20 MG/ML) 5 ML SYRINGE
INTRAMUSCULAR | Status: AC
  Filled 2016-09-21: qty 5

## 2016-09-21 MED ORDER — VANCOMYCIN HCL IN DEXTROSE 1-5 GM/200ML-% IV SOLN
1000.0000 mg | INTRAVENOUS | Status: AC
  Administered 2016-09-21: 1000 mg via INTRAVENOUS
  Filled 2016-09-21: qty 200

## 2016-09-21 MED ORDER — SENNA 8.6 MG PO TABS
1.0000 | ORAL_TABLET | Freq: Two times a day (BID) | ORAL | Status: DC
  Administered 2016-09-21 – 2016-09-22 (×3): 8.6 mg via ORAL
  Filled 2016-09-21 (×3): qty 1

## 2016-09-21 MED ORDER — ONDANSETRON HCL 4 MG/2ML IJ SOLN
INTRAMUSCULAR | Status: AC
  Filled 2016-09-21: qty 2

## 2016-09-21 MED ORDER — DEXAMETHASONE SODIUM PHOSPHATE 4 MG/ML IJ SOLN
2.0000 mg | Freq: Three times a day (TID) | INTRAMUSCULAR | Status: AC
  Administered 2016-09-21 – 2016-09-22 (×3): 2 mg via INTRAVENOUS
  Filled 2016-09-21 (×3): qty 1

## 2016-09-21 MED ORDER — LIDOCAINE-EPINEPHRINE 2 %-1:100000 IJ SOLN
INTRAMUSCULAR | Status: AC
  Filled 2016-09-21: qty 1

## 2016-09-21 MED ORDER — METHOCARBAMOL 750 MG PO TABS
750.0000 mg | ORAL_TABLET | Freq: Four times a day (QID) | ORAL | Status: DC
  Administered 2016-09-21 – 2016-09-22 (×3): 750 mg via ORAL
  Filled 2016-09-21 (×3): qty 1

## 2016-09-21 MED ORDER — CELECOXIB 200 MG PO CAPS
200.0000 mg | ORAL_CAPSULE | Freq: Two times a day (BID) | ORAL | Status: DC
  Administered 2016-09-21 – 2016-09-22 (×3): 200 mg via ORAL
  Filled 2016-09-21 (×3): qty 1

## 2016-09-21 MED ORDER — PROPOFOL 10 MG/ML IV BOLUS
INTRAVENOUS | Status: AC
  Filled 2016-09-21: qty 20

## 2016-09-21 MED ORDER — FLEET ENEMA 7-19 GM/118ML RE ENEM: 1.0000 | ENEMA | Freq: Once | RECTAL | Status: DC | PRN

## 2016-09-21 MED ORDER — OXYCODONE HCL 5 MG PO TABS
5.0000 mg | ORAL_TABLET | ORAL | Status: DC | PRN
  Administered 2016-09-21 – 2016-09-22 (×3): 10 mg via ORAL
  Filled 2016-09-21 (×4): qty 2

## 2016-09-21 MED ORDER — KETOROLAC TROMETHAMINE 30 MG/ML IJ SOLN
INTRAMUSCULAR | Status: DC | PRN
  Administered 2016-09-21: 30 mg via INTRAVENOUS

## 2016-09-21 SURGICAL SUPPLY — 65 items
12MM DISTRACTION SCREW, QUICK START ×3 IMPLANT
BIT DRILL NEURO 2X3.1 SFT TUCH (MISCELLANEOUS) ×1 IMPLANT
BLADE SURG 11 STRL SS (BLADE) ×3 IMPLANT
BLADE ULTRA TIP 2M (BLADE) IMPLANT
BUR MATCHSTICK NEURO 3.0 LAGG (BURR) ×3 IMPLANT
CANISTER SUCT 3000ML PPV (MISCELLANEOUS) ×3 IMPLANT
CHLORAPREP W/TINT 26ML (MISCELLANEOUS) ×3 IMPLANT
DECANTER SPIKE VIAL GLASS SM (MISCELLANEOUS) ×3 IMPLANT
DERMABOND ADVANCED (GAUZE/BANDAGES/DRESSINGS) ×2
DERMABOND ADVANCED .7 DNX12 (GAUZE/BANDAGES/DRESSINGS) ×1 IMPLANT
DISC MOBI-C CERVICAL 13X15 H5 (Miscellaneous) ×3 IMPLANT
DRAIN CHANNEL 7F 3/4 FLAT (WOUND CARE) ×3 IMPLANT
DRAPE C-ARM 42X72 X-RAY (DRAPES) ×3 IMPLANT
DRAPE C-ARMOR (DRAPES) ×3 IMPLANT
DRAPE HALF SHEET 40X57 (DRAPES) IMPLANT
DRAPE LAPAROTOMY 100X72 PEDS (DRAPES) ×3 IMPLANT
DRAPE MICROSCOPE LEICA (MISCELLANEOUS) ×3 IMPLANT
DRAPE POUCH INSTRU U-SHP 10X18 (DRAPES) ×3 IMPLANT
DRAPE SHEET LG 3/4 BI-LAMINATE (DRAPES) IMPLANT
DRILL NEURO 2X3.1 SOFT TOUCH (MISCELLANEOUS) ×3
DRSG OPSITE POSTOP 4X6 (GAUZE/BANDAGES/DRESSINGS) ×3 IMPLANT
ELECT COATED BLADE 2.86 ST (ELECTRODE) ×3 IMPLANT
ELECT REM PT RETURN 9FT ADLT (ELECTROSURGICAL) ×3
ELECTRODE REM PT RTRN 9FT ADLT (ELECTROSURGICAL) ×1 IMPLANT
EVACUATOR 1/8 PVC DRAIN (DRAIN) IMPLANT
EVACUATOR SILICONE 100CC (DRAIN) ×3 IMPLANT
GAUZE SPONGE 4X4 12PLY STRL (GAUZE/BANDAGES/DRESSINGS) IMPLANT
GAUZE SPONGE 4X4 16PLY XRAY LF (GAUZE/BANDAGES/DRESSINGS) IMPLANT
GLOVE BIO SURGEON STRL SZ8 (GLOVE) ×3 IMPLANT
GLOVE BIO SURGEON STRL SZ8.5 (GLOVE) ×3 IMPLANT
GLOVE BIOGEL PI IND STRL 7.0 (GLOVE) ×2 IMPLANT
GLOVE BIOGEL PI IND STRL 7.5 (GLOVE) ×2 IMPLANT
GLOVE BIOGEL PI INDICATOR 7.0 (GLOVE) ×4
GLOVE BIOGEL PI INDICATOR 7.5 (GLOVE) ×4
GLOVE SURG SS PI 6.5 STRL IVOR (GLOVE) ×6 IMPLANT
GOWN STRL REUS W/ TWL LRG LVL3 (GOWN DISPOSABLE) ×3 IMPLANT
GOWN STRL REUS W/ TWL XL LVL3 (GOWN DISPOSABLE) ×1 IMPLANT
GOWN STRL REUS W/TWL LRG LVL3 (GOWN DISPOSABLE) ×6
GOWN STRL REUS W/TWL XL LVL3 (GOWN DISPOSABLE) ×2
HEMOSTAT POWDER KIT SURGIFOAM (HEMOSTASIS) ×3 IMPLANT
KIT BASIN OR (CUSTOM PROCEDURE TRAY) ×3 IMPLANT
KIT ROOM TURNOVER OR (KITS) ×3 IMPLANT
NEEDLE HYPO 21X1.5 SAFETY (NEEDLE) ×3 IMPLANT
NEEDLE SPNL 20GX3.5 QUINCKE YW (NEEDLE) ×3 IMPLANT
NEEDLE SPNL 22GX3.5 QUINCKE BK (NEEDLE) ×3 IMPLANT
NS IRRIG 1000ML POUR BTL (IV SOLUTION) ×3 IMPLANT
PACK LAMINECTOMY NEURO (CUSTOM PROCEDURE TRAY) ×3 IMPLANT
PAD ARMBOARD 7.5X6 YLW CONV (MISCELLANEOUS) ×6 IMPLANT
PATTIES SURGICAL .5X1.5 (GAUZE/BANDAGES/DRESSINGS) IMPLANT
PATTIES SURGICAL 1X1 (DISPOSABLE) ×3 IMPLANT
RUBBERBAND STERILE (MISCELLANEOUS) ×6 IMPLANT
SPONGE INTESTINAL PEANUT (DISPOSABLE) ×6 IMPLANT
SPONGE SURGIFOAM ABS GEL SZ50 (HEMOSTASIS) IMPLANT
STAPLER VISISTAT 35W (STAPLE) ×3 IMPLANT
STOCKINETTE 6  STRL (DRAPES) ×2
STOCKINETTE 6 STRL (DRAPES) ×1 IMPLANT
SUT STRATAFIX MNCRL+ 3-0 PS-2 (SUTURE) ×1
SUT STRATAFIX MONOCRYL 3-0 (SUTURE) ×2
SUT VIC AB 3-0 SH 8-18 (SUTURE) ×3 IMPLANT
SUTURE STRATFX MNCRL+ 3-0 PS-2 (SUTURE) ×1 IMPLANT
TOWEL OR 17X24 6PK STRL BLUE (TOWEL DISPOSABLE) ×6 IMPLANT
TOWEL OR 17X26 10 PK STRL BLUE (TOWEL DISPOSABLE) ×3 IMPLANT
TUBE CONNECTING 12'X1/4 (SUCTIONS) ×1
TUBE CONNECTING 12X1/4 (SUCTIONS) ×2 IMPLANT
WATER STERILE IRR 1000ML POUR (IV SOLUTION) ×3 IMPLANT

## 2016-09-21 NOTE — Transfer of Care (Signed)
Immediate Anesthesia Transfer of Care Note  Patient: Jasmin Stephenson  Procedure(s) Performed: Procedure(s) with comments: CERVICAL SIX- CERVICAL SEVEN ARTHROPLASTY (N/A) - C6-7 Arthroplasty  Patient Location: PACU  Anesthesia Type:General  Level of Consciousness: patient cooperative and lethargic  Airway & Oxygen Therapy: Patient Spontanous Breathing and Patient connected to nasal cannula oxygen  Post-op Assessment: Report given to RN, Post -op Vital signs reviewed and stable and Patient moving all extremities X 4  Post vital signs: Reviewed and stable  Last Vitals:  Vitals:   09/21/16 0818  BP: (!) 126/91  Pulse: 67  Resp: 18  Temp: 37.3 C    Last Pain:  Vitals:   09/21/16 0818  TempSrc: Oral      Patients Stated Pain Goal: 3 ([id number])  Complications: No apparent anesthesia complications

## 2016-09-21 NOTE — Op Note (Signed)
09/21/2016  11:38 AM  PATIENT:  Jasmin Stephenson  45 y.o. female  PRE-OPERATIVE DIAGNOSIS:  Cervical radiculopathy, herniated nucleus pulposis right C6-7  POST-OPERATIVE DIAGNOSIS:  Same  PROCEDURE:  C6-7 anterior discectomy and arthroplasty  SURGEON:  Aldean Ast, MD  ASSISTANTS: Newman Pies, MD  ANESTHESIA:   General  DRAINS: 7 flat JP   SPECIMEN:  None  INDICATION FOR PROCEDURE: 45 year old woman with medically refractory cervical radiculopathy. Patient understood the risks, benefits, and alternatives and potential outcomes and wished to proceed.  PROCEDURE DETAILS: Patient was brought to the operating room placed under general endotracheal anesthesia. Patient was placed in the supine position on the operating room table. The neck was prepped with betadine and chloraprep and draped in a sterile fashion.     Local anesthestic was injected and a transverse incision was made on the right side of the neck.  Dissection was carried down thru the subcutaneous tissue and the platysma was  elevated, opened, and undermined with Metzenbaum scissors.  Dissection was then carried out thru an avascular plane leaving the sternocleidomastoid, carotid artery, and jugular vein laterally and the trachea and esophagus medially. The ventral aspect of the vertebral column was identified and a localizing x-ray was taken. The C6-7 level was identified. The longus colli muscles were then elevated and the retractor was placed. The annulus was incised and the disc space entered. Discectomy was performed with micro-curettes and pituitary and kerrison rongeurs. I then used the high-speed drill to drill the uncovertebral joints. The operating microscope was draped and brought into the field provided additional magnification, illumination and visualization. Utilizing microsurgical technique, discectomy was continued posteriorly thru the disc space. Posterior longitudinal ligament was opened with a nerve hook,  and then removed along with disc herniation decompressing the spinal canal and thecal sac. We then continued to remove disc material decompressing the neural foramina and exiting nerve roots bilaterally. So by both visualization and palpation we felt we had an adequate decompression of the neural elements. We then measured the height of the intravertebral disc space and selected a 48mm Mobi-C arthroplasty device. It was then gently positioned in the intravertebral disc space and countersunk. Position was confirmed with AP/lateral fluoro The wound was irrigated with bacitracin solution, checked for hemostasis which was established and confirmed. Once meticulous hemostasis was achieved a JP drain was placed and we then proceeded with closure. The platysma was closed with interrupted 3-0 undyed Vicryl suture, the skin was closed with a running monocryl stratafix suture. The skin edges were approximated with dermabond. The drapes were removed. A sterile dressing was applied. The patient was then awakened from general anesthesia and transferred to the recovery room in stable condition. At the end of the procedure all sponge, needle and instrument counts were correct.  PATIENT DISPOSITION:  PACU - hemodynamically stable.   Delay start of Pharmacological VTE agent (>24hrs) due to surgical blood loss or risk of bleeding:  yes

## 2016-09-21 NOTE — Discharge Summary (Signed)
Date of Admission: 09/21/2016  Date of Discharge: 09/21/16  Admission Diagnosis: C6-7 disc herniation  Discharge Diagnosis: Same  Procedure Performed: C6-7 arthroplasty  Attending: Tamala Fothergill, MD  Hospital Course:  The patient was admitted for the above listed operation and had an uncomplicated post-operative course.  They were discharged in stable condition.  Follow up: 3 weeks  Resume home meds, norco for pain, gabapentin, robaxin

## 2016-09-21 NOTE — Anesthesia Procedure Notes (Signed)
Procedure Name: Intubation Date/Time: 09/21/2016 9:46 AM Performed by: Mervyn Gay Pre-anesthesia Checklist: Patient identified, Patient being monitored, Timeout performed, Emergency Drugs available and Suction available Patient Re-evaluated:Patient Re-evaluated prior to inductionOxygen Delivery Method: Circle System Utilized Preoxygenation: Pre-oxygenation with 100% oxygen Intubation Type: IV induction Ventilation: Mask ventilation without difficulty Laryngoscope Size: Miller and 3 Grade View: Grade I Tube type: Oral Tube size: 7.5 mm Number of attempts: 2 (First DL with MAC 3 --> grade 4 view. Second DL with Sabra Heck 3 --> gr 1 view and AOI) Airway Equipment and Method: Stylet Placement Confirmation: ETT inserted through vocal cords under direct vision,  positive ETCO2 and breath sounds checked- equal and bilateral Secured at: 22 cm Tube secured with: Tape Dental Injury: Teeth and Oropharynx as per pre-operative assessment

## 2016-09-21 NOTE — Progress Notes (Signed)
Attempted to contact patient to come in early for surgery. Left VM

## 2016-09-21 NOTE — Anesthesia Postprocedure Evaluation (Signed)
Anesthesia Post Note  Patient: Jasmin Stephenson  Procedure(s) Performed: Procedure(s) (LRB): CERVICAL SIX- CERVICAL SEVEN ARTHROPLASTY (N/A)  Patient location during evaluation: PACU Anesthesia Type: General Level of consciousness: awake and alert and patient cooperative Pain management: pain level controlled Vital Signs Assessment: post-procedure vital signs reviewed and stable Respiratory status: spontaneous breathing and respiratory function stable Cardiovascular status: stable Anesthetic complications: no    Last Vitals:  Vitals:   09/21/16 1325 09/21/16 1349  BP: 112/77 (!) 130/95  Pulse: 66 64  Resp: 14 18  Temp: 37.2 C 37 C    Last Pain:  Vitals:   09/21/16 1350  TempSrc:   PainSc: [place]

## 2016-09-21 NOTE — Anesthesia Preprocedure Evaluation (Addendum)
Anesthesia Evaluation  Patient identified by MRN, date of birth, ID band Patient awake    Reviewed: Allergy & Precautions, NPO status , Patient's Chart, lab work & pertinent test results  Airway Mallampati: II   Neck ROM: Limited    Dental  (+) Chipped,    Pulmonary asthma , Current Smoker,    breath sounds clear to auscultation       Cardiovascular + CAD   Rhythm:regular Rate:Normal     Neuro/Psych PSYCHIATRIC DISORDERS Anxiety Depression    GI/Hepatic GERD  ,(+)     substance abuse  marijuana use,   Endo/Other    Renal/GU      Musculoskeletal  (+) Arthritis ,   Abdominal (+) + obese,   Peds  Hematology   Anesthesia Other Findings   Reproductive/Obstetrics                           BP Readings from Last 3 Encounters:  09/21/16 (!) 126/91  07/13/16 137/79  06/25/16 149/93   Lab Results  Component Value Date   WBC 7.4 09/21/2016   HGB 13.3 09/21/2016   HCT 41.0 09/21/2016   MCV 89.1 09/21/2016   PLT 397 09/21/2016     Chemistry      Component Value Date/Time   NA 138 09/21/2016 0819   K 4.0 09/21/2016 0819   CL 110 09/21/2016 0819   CO2 20 (L) 09/21/2016 0819   BUN 9 09/21/2016 0819   CREATININE 0.91 09/21/2016 0819   CREATININE 0.86 01/16/2016 1151      Component Value Date/Time   CALCIUM 9.4 09/21/2016 0819   ALKPHOS 73 01/16/2016 1151   AST 15 01/16/2016 1151   ALT 7 01/16/2016 1151   BILITOT 0.5 01/16/2016 1151      Anesthesia Physical Anesthesia Plan  ASA: II  Anesthesia Plan: General   Post-op Pain Management:    Induction: Intravenous  Airway Management Planned: Oral ETT  Additional Equipment:   Intra-op Plan:   Post-operative Plan: Extubation in OR  Informed Consent: I have reviewed the patients History and Physical, chart, labs and discussed the procedure including the risks, benefits and alternatives for the proposed anesthesia with the  patient or authorized representative who has indicated his/her understanding and acceptance.     Plan Discussed with: CRNA, Anesthesiologist and Surgeon  Anesthesia Plan Comments:         Anesthesia Quick Evaluation

## 2016-09-21 NOTE — H&P (Signed)
CC:  No chief complaint on file.   HPI: Jasmin Stephenson is a 45 y.o. female with cervical radiculopathy due to a herniated disc at C6-7 on the right.  She presents for arthroplasty.  PMH: Past Medical History:  Diagnosis Date  . Anxiety   . Arthritis    and tendonitis  . Asthma   . Breast calcification, right   . Cervical herniated disc   . Depression   . GERD (gastroesophageal reflux disease)   . Myocardial infarction    " Mild"    PSH: Past Surgical History:  Procedure Laterality Date  . ABDOMINAL SURGERY    . APPENDECTOMY    . BLADDER SURGERY    . IRRIGATION AND DEBRIDEMENT ABSCESS Right 09/10/2014   Procedure: IRRIGATION AND DEBRIDEMENT RIGHT BREAST  ABSCESS;  Surgeon: Gayland Curry, MD;  Location: [facility];  Service: General;  Laterality: Right;  . WISDOM TOOTH EXTRACTION      SH: Social History  Substance Use Topics  . Smoking status: Current Every Day Smoker    Packs/day: 1.50    Years: 7.00    Types: Cigarettes  . Smokeless tobacco: Never Used  . Alcohol use No    MEDS: Prior to Admission medications   Medication Sig Start Date End Date Taking? Authorizing Provider  acetaminophen (TYLENOL) 325 MG tablet Take 2 tablets (650 mg total) by mouth every 6 (six) hours as needed for mild pain (or Temp > 100). Patient not taking: Reported on 11/06/2014 09/12/14   Erby Pian, NP  albuterol (PROVENTIL HFA;VENTOLIN HFA) 108 (90 BASE) MCG/ACT inhaler Inhale 2 puffs into the lungs every 6 (six) hours as needed for wheezing or shortness of breath. Reported on 01/16/2016    Historical Provider, MD  ibuprofen (ADVIL,MOTRIN) 600 MG tablet Take 1 tablet (600 mg total) by mouth every 6 (six) hours as needed. 06/25/16   Noland Fordyce, PA-C  methocarbamol (ROBAXIN) 500 MG tablet Take 1 tablet (500 mg total) by mouth 2 (two) times daily. 06/25/16   Noland Fordyce, PA-C  Multiple Vitamin (MULTIVITAMIN WITH MINERALS) TABS tablet Take 1 tablet by mouth daily. Reported on 01/16/2016     Historical Provider, MD  naproxen (NAPROSYN) 375 MG tablet Take 1 tablet (375 mg total) by mouth 2 (two) times daily with a meal. Patient not taking: Reported on 09/21/2016 06/13/16   Geronimo Boot, MD  predniSONE (DELTASONE) 20 MG tablet 3 tabs po day one, then 2 po daily x 4 days 06/25/16   Noland Fordyce, PA-C  traMADol (ULTRAM) 50 MG tablet Take 1 tablet (50 mg total) by mouth every 6 (six) hours as needed. 07/13/16   Micheline Chapman, NP    ALLERGY: Allergies  Allergen Reactions  . Penicillins Nausea And Vomiting and Rash    ROS: ROS  NEUROLOGIC EXAM: Awake, alert, oriented Memory and concentration grossly intact Speech fluent, appropriate CN grossly intact Motor exam: Right triceps weakness Sensation grossly intact to LT  IMAGING: There is a large right sided disc herniation at C6-7  IMPRESSION: - 45 y.o. female with cervical radiculopathy. Right triceps weakness.  PLAN: - C6-7 arthroplasty

## 2016-09-22 DIAGNOSIS — M50123 Cervical disc disorder at C6-C7 level with radiculopathy: Secondary | ICD-10-CM | POA: Diagnosis not present

## 2016-09-22 NOTE — Progress Notes (Signed)
Doing well Radicular pain better Incision looks good Home today

## 2016-09-22 NOTE — Progress Notes (Signed)
Pt doing well. Pt and husband given D/C instructions with Rx's, verbal understanding was provided. Pt's incision is closed with dermabond and is clean and dry. Pt's IV and JP drain were removed prior to D/C. Pt D/C'd home via wheelchair @ 1105 per MD order. Pt is stable @ D/C and has no other needs at this time. Holli Humbles, RN

## 2016-09-22 NOTE — Evaluation (Signed)
Occupational Therapy Evaluation and Discharge Patient Details Name: Jasmin Stephenson MRN: [id number] DOB: 05-27-1971 Today's Date: 09/22/2016    History of Present Illness 45 y/o female presenting post-op for C6-7 arthroplasty. Pt has a past medical history of Anxiety; Arthritis; Asthma; Breast calcification, right; Cervical herniated disc; Depression; GERD (gastroesophageal reflux disease); and Myocardial infarction.   Clinical Impression   PTA Pt independent in ADL/IADL and mobility. Pt currently min guard/setup for ADL and modified independent for mobility. Pt received cervical precautions handout and reviewed thoroughly including functional examples and practice. Pt still needing max verbal cues to maintain precautions during ADL with OT. Pt would benefit from soft collar as physical reminder to maintain precautions. Discussed with Pt and she is in agreement.  All assessment and education complete. Pt and caregiver with no questions or concerns for OT. No further OT needs at this time.    Follow Up Recommendations  No OT follow up;Supervision - Intermittent    Equipment Recommendations  None recommended by OT;Other (comment) (rec soft collar for physical reminder of precautions)    Recommendations for Other Services       Precautions / Restrictions Precautions Precautions: Fall;Cervical Precaution Comments: Pt provided handout and reviewed thoroughly with examples provided Required Braces or Orthoses:  (none, but soft collar recommended by OT as physical reminder) Restrictions Weight Bearing Restrictions: No      Mobility Bed Mobility Overal bed mobility: Needs Assistance Bed Mobility: Rolling Rolling: Supervision         General bed mobility comments: Pt educated in rolling for bed mobility at home. Pt demonstrated.  Transfers Overall transfer level: Modified independent Equipment used: None                  Balance Overall balance assessment: No apparent  balance deficits (not formally assessed)                                          ADL Overall ADL's : Needs assistance/impaired Eating/Feeding: Set up;Cueing for safety;Sitting (Pt required multiple cues to maintain precautions)   Grooming: Oral care;Wash/dry face;Supervision/safety;Cueing for safety;Cueing for compensatory techniques;Standing Grooming Details (indicate cue type and reason): Pt required multiple cues to maintain precautions. Pt ecducated in cup method to brush teeth and maintain precautions. Pt educated to set things up on her left to prevent twisting during grooming at the sink (Pt left handed)         Upper Body Dressing : Min guard;With caregiver independent assisting;Cueing for safety;Sitting (Pt educated in dressing, and compensatory strategies)   Lower Body Dressing: Supervision/safety;With caregiver independent assisting;Cueing for compensatory techniques;Sit to/from stand Lower Body Dressing Details (indicate cue type and reason): Pt able to don underwear, and pants crossing legs and bringing feet up to knees. Pt with verbal cues to maintain precautions during LB dressing Toilet Transfer: Supervision/safety;Ambulation   Toileting- Clothing Manipulation and Hygiene: Supervision/safety;Cueing for back precautions;Sit to/from stand Toileting - Clothing Manipulation Details (indicate cue type and reason): verbal cues to maintain precautions. Pt able to perform peri care and stand and turn whole body to flush the toilet. Tub/ Shower Transfer: Tub transfer;Supervision/safety;With caregiver independent assisting;Ambulation Tub/Shower Transfer Details (indicate cue type and reason): Pt educated that caregiver should be present for first time, and use wall for support going over tub. Functional mobility during ADLs: Supervision/safety General ADL Comments: Pt needed max verbal cues to maintain precautions during  ADL and during discussion of precautions  handout. Pt agreed that having a soft collar would be a good physical reminder of needing to maintain precautions. Reviewed handout twice with Pt and female caregiver who was in room for session.     Vision     Perception     Praxis      Pertinent Vitals/Pain Pain Assessment: 0-10 Pain Score: 6  Pain Location: surgical site/neck Pain Descriptors / Indicators: Sore Pain Intervention(s): Monitored during session;Ice applied     Hand Dominance Left   Extremity/Trunk Assessment Upper Extremity Assessment Upper Extremity Assessment: Overall WFL for tasks assessed   Lower Extremity Assessment Lower Extremity Assessment: Overall WFL for tasks assessed   Cervical / Trunk Assessment Cervical / Trunk Assessment: Normal   Communication Communication Communication: No difficulties   Cognition Arousal/Alertness: Awake/alert Behavior During Therapy: WFL for tasks assessed/performed Overall Cognitive Status: Within Functional Limits for tasks assessed                     General Comments   Pt had female caregiver present for entire session    Exercises       Shoulder Instructions      Home Living Family/patient expects to be discharged to:: Private residence Living Arrangements: Spouse/significant other Available Help at Discharge: Family;Available 24 hours/day Type of Home: House Home Access: Stairs to enter [employer] of Steps: 3   Home Layout: One level     Bathroom Shower/Tub: Tub/shower unit;Walk-in shower Shower/tub characteristics: Industrial/product designer: Yes              Prior Functioning/Environment Level of Independence: Independent                 OT Problem List: Decreased safety awareness;Decreased knowledge of precautions;Pain   OT Treatment/Interventions:      OT Goals(Current goals can be found in the care plan section) Acute Rehab OT Goals Patient Stated Goal: To get better so she can  have friends over again OT Goal Formulation: All assessment and education complete, DC therapy Time For Goal Achievement: 09/29/16 Potential to Achieve Goals: Good  OT Frequency:     Barriers to D/C:            Co-evaluation              End of Session Nurse Communication: Precautions;Other (comment) (requests soft collar for Pt)  Activity Tolerance: Patient tolerated treatment well Patient left: in bed;with call bell/phone within reach;with family/visitor present   Time: [id number] OT Time Calculation (min): 38 min Charges:  OT General Charges $OT Visit: 1 Procedure OT Evaluation $OT Eval Low Complexity: 1 Procedure OT Treatments $Self Care/Home Management : 23-37 mins G-Codes: OT G-codes **NOT FOR INPATIENT CLASS** Functional Assessment Tool Used: clinical judgement Functional Limitation: Self care Self Care Current Status [id number]): At least 1 percent but less than 20 percent impaired, limited or restricted Self Care Goal Status [id number]): At least 1 percent but less than 20 percent impaired, limited or restricted Self Care Discharge Status [phone]): At least 1 percent but less than 20 percent impaired, limited or restricted  Jaci Carrel 09/22/2016, 10:35 AM  Hulda Humphrey OTR/L [phone]

## 2016-09-24 ENCOUNTER — Encounter (HOSPITAL_COMMUNITY): Payer: Self-pay | Admitting: Neurological Surgery

## 2016-10-19 ENCOUNTER — Encounter (HOSPITAL_COMMUNITY): Payer: Self-pay

## 2016-10-19 ENCOUNTER — Inpatient Hospital Stay (HOSPITAL_COMMUNITY)
Admission: AD | Admit: 2016-10-19 | Discharge: 2016-10-19 | Disposition: A | Discharge status: Home / Self Care | Payer: Medicaid Other | Source: Ambulatory Visit | Attending: Family Medicine | Admitting: Family Medicine

## 2016-10-19 DIAGNOSIS — F17210 Nicotine dependence, cigarettes, uncomplicated: Secondary | ICD-10-CM | POA: Diagnosis not present

## 2016-10-19 DIAGNOSIS — L298 Other pruritus: Secondary | ICD-10-CM | POA: Diagnosis not present

## 2016-10-19 DIAGNOSIS — N760 Acute vaginitis: Secondary | ICD-10-CM

## 2016-10-19 DIAGNOSIS — B373 Candidiasis of vulva and vagina: Secondary | ICD-10-CM | POA: Insufficient documentation

## 2016-10-19 DIAGNOSIS — Z880 Allergy status to penicillin: Secondary | ICD-10-CM | POA: Insufficient documentation

## 2016-10-19 DIAGNOSIS — N898 Other specified noninflammatory disorders of vagina: Secondary | ICD-10-CM

## 2016-10-19 DIAGNOSIS — L293 Anogenital pruritus, unspecified: Secondary | ICD-10-CM | POA: Diagnosis present

## 2016-10-19 LAB — URINE MICROSCOPIC-ADD ON: Bacteria, UA: NONE SEEN

## 2016-10-19 LAB — URINALYSIS, ROUTINE W REFLEX MICROSCOPIC
Bilirubin Urine: NEGATIVE
GLUCOSE, UA: NEGATIVE mg/dL
Ketones, ur: NEGATIVE mg/dL
LEUKOCYTES UA: NEGATIVE
Nitrite: NEGATIVE
PH: 6 (ref 5.0–8.0)
Protein, ur: NEGATIVE mg/dL
Specific Gravity, Urine: <1.005 — ABNORMAL LOW (ref 1.005–1.030)

## 2016-10-19 LAB — POCT PREGNANCY, URINE: Preg Test, Ur: NEGATIVE

## 2016-10-19 MED ORDER — FLUCONAZOLE 150 MG PO TABS: 150.0000 mg | ORAL_TABLET | ORAL | 0 refills | Status: AC

## 2016-10-19 NOTE — MAU Provider Note (Signed)
History    Chief Complaint  Patient presents with  . Vaginal Itching   [name] is a 45 year-old [place] female with a history of prior yeast infections who presents with vaginal itching.  Patient reports her itching began 2 or 3 days ago.  She has tried taking Monistat which helps temporarily.  She last used it this morning around 0800.  She denies vaginal burning, discharge, and bleeding.  No dysuria, odor, or abdominal pain.  No history of diabetes or recent antibiotic use.  Patient has had one sexual partner for over 30 years.  She has used PO fluconazole in the past with improvement.  No other complaints.     OB History    Gravida Para Term Preterm AB Living   3 3 3     3    SAB TAB Ectopic Multiple Live Births                  Past Medical History:  Diagnosis Date  . Anxiety   . Arthritis    and tendonitis  . Asthma   . Breast calcification, right   . Cervical herniated disc   . Depression   . GERD (gastroesophageal reflux disease)   . Myocardial infarction    " Mild"    Past Surgical History:  Procedure Laterality Date  . ABDOMINAL SURGERY    . APPENDECTOMY    . BLADDER SURGERY    . CERVICAL DISC ARTHROPLASTY N/A 09/21/2016   Procedure: CERVICAL SIX- CERVICAL SEVEN ARTHROPLASTY;  Surgeon: Kevan Ny Ditty, MD;  Location: [facility];  Service: Neurosurgery;  Laterality: N/A;  C6-7 Arthroplasty  . IRRIGATION AND DEBRIDEMENT ABSCESS Right 09/10/2014   Procedure: IRRIGATION AND DEBRIDEMENT RIGHT BREAST  ABSCESS;  Surgeon: Gayland Curry, MD;  Location: [facility];  Service: General;  Laterality: Right;  . WISDOM TOOTH EXTRACTION      Family History  Problem Relation Age of Onset  . Diabetes Father   . Breast cancer Sister   . Alcohol abuse Neg Hx   . Arthritis Neg Hx   . Asthma Neg Hx   . Birth defects Neg Hx   . Cancer Neg Hx   . COPD Neg Hx   . Depression Neg Hx   . Drug abuse Neg Hx   . Early death Neg Hx   . Hearing loss Neg Hx   . Heart disease Neg Hx   .  Hyperlipidemia Neg Hx   . Hypertension Neg Hx   . Kidney disease Neg Hx   . Learning disabilities Neg Hx   . Mental illness Neg Hx   . Mental retardation Neg Hx   . Miscarriages / Stillbirths Neg Hx   . Stroke Neg Hx   . Vision loss Neg Hx     Social History  Substance Use Topics  . Smoking status: Current Every Day Smoker    Packs/day: 1.50    Years: 7.00    Types: Cigarettes  . Smokeless tobacco: Never Used  . Alcohol use No    Allergies:  Allergies  Allergen Reactions  . Penicillins Nausea And Vomiting and Rash    Has patient had a PCN reaction causing immediate rash, facial/tongue/throat swelling, SOB or lightheadedness with hypotension: Yes Has patient had a PCN reaction causing severe rash involving mucus membranes or skin necrosis: Yes Has patient had a PCN reaction that required hospitalization No Has patient had a PCN reaction occurring within the last 10 years: No If all of the  above answers are "NO", then may proceed with Cephalosporin use.    Prescriptions Prior to Admission  Medication Sig Dispense Refill Last Dose  . acetaminophen (TYLENOL) 325 MG tablet Take 2 tablets (650 mg total) by mouth every 6 (six) hours as needed for mild pain (or Temp > 100).   10/18/2016 at Unknown time  . gabapentin (NEURONTIN) 300 MG capsule Take 1 capsule (300 mg total) by mouth 3 (three) times daily. 90 capsule 2 10/18/2016 at Unknown time  . HYDROcodone-acetaminophen (NORCO) 7.5-325 MG tablet Take 1 tablet by mouth every 6 (six) hours as needed for moderate pain. 60 tablet 0 10/18/2016 at Unknown time  . methocarbamol (ROBAXIN) 750 MG tablet Take 1 tablet (750 mg total) by mouth every 6 (six) hours as needed for muscle spasms. 120 tablet 2 10/18/2016 at Unknown time  . albuterol (PROVENTIL HFA;VENTOLIN HFA) 108 (90 BASE) MCG/ACT inhaler Inhale 2 puffs into the lungs every 6 (six) hours as needed for wheezing or shortness of breath. Reported on 01/16/2016   PRN  . ibuprofen (ADVIL,MOTRIN)  600 MG tablet Take 1 tablet (600 mg total) by mouth every 6 (six) hours as needed. (Patient not taking: Reported on 10/19/2016) 30 tablet 0 Not Taking at Unknown time  . naproxen (NAPROSYN) 375 MG tablet Take 1 tablet (375 mg total) by mouth 2 (two) times daily with a meal. (Patient not taking: Reported on 09/21/2016) 20 tablet 0 More than a month at Unknown time  . predniSONE (DELTASONE) 20 MG tablet 3 tabs po day one, then 2 po daily x 4 days (Patient not taking: Reported on 10/19/2016) 11 tablet 0 Not Taking at Unknown time  . traMADol (ULTRAM) 50 MG tablet Take 1 tablet (50 mg total) by mouth every 6 (six) hours as needed. (Patient not taking: Reported on 10/19/2016) 15 tablet 0 Not Taking at Unknown time    Review of Systems  Constitutional: Negative.   HENT: Negative.   Eyes: Negative.   Respiratory: Negative.   Cardiovascular: Negative.   Gastrointestinal: Negative for abdominal pain.  Genitourinary: Negative for dysuria and hematuria.       Vaginal itching  Skin: Negative.   Neurological: Negative.   Psychiatric/Behavioral: Negative.  Negative for depression and suicidal ideas.   Physical Exam Blood pressure 126/80, pulse 89, temperature 98.8 F (37.1 C), temperature source Oral, resp. rate 16, weight 78.8 kg (173 lb 12.8 oz), last menstrual period 09/18/2016. Physical Exam  Constitutional: She is oriented to person, place, and time. She appears well-developed and well-nourished. No distress.  HENT:  Head: Normocephalic and atraumatic.  Neck: Normal range of motion. Neck supple.  Respiratory: Effort normal. No respiratory distress.  GI: Soft. She exhibits no distension.  Musculoskeletal: Normal range of motion.  Neurological: She is alert and oriented to person, place, and time.  Skin: Skin is warm and dry.  Psychiatric: She has a normal mood and affect. Her behavior is normal.    MAU Course Procedures  MDM Patient presents with vaginal itching concerning for yeast  vaginitis.  She has used Monistat in the last 24 hours so a wet prep would not be beneficial.  Pelvic exam deferred.  Patient has had multiple episodes of this over the past few years.    Assessment: Ms. [name] is a 45 year-old female with a history of prior yeast infections who presents with vaginal itching, likely from yeast vaginitis.  Plan:   Fluconazole 150 mg q72 hours for 3 doses

## 2016-10-19 NOTE — MAU Note (Signed)
Had surgery back on  10/9 (orthopedic on neck).  Is on 4 different meds, is on antibiotics.  Feels like it could be yeast, is itching..  No bleeding, no d/c

## 2016-10-19 NOTE — Discharge Instructions (Signed)

## 2016-10-19 NOTE — MAU Provider Note (Signed)
History     CSN: [id number]  Arrival date and time: 10/19/16 [id number]   First Provider Initiated Contact with Patient 10/19/16 [phone]      Chief Complaint  Patient presents with  . Vaginal Itching   HPI   [name] is a 44 y.o. female [phone] here with vaginal itching. She used monistat this morning; she bought a 3 day pack to use. She has been using it for a couple of days and does not feel her symptoms are improving much. She has frequent yeast infections and feels that diflucan works well.   OB History    Gravida Para Term Preterm AB Living   3 3 3     3    SAB TAB Ectopic Multiple Live Births                  Past Medical History:  Diagnosis Date  . Anxiety   . Arthritis    and tendonitis  . Asthma   . Breast calcification, right   . Cervical herniated disc   . Depression   . GERD (gastroesophageal reflux disease)   . Myocardial infarction    " Mild"    Past Surgical History:  Procedure Laterality Date  . ABDOMINAL SURGERY    . APPENDECTOMY    . BLADDER SURGERY    . CERVICAL DISC ARTHROPLASTY N/A 09/21/2016   Procedure: CERVICAL SIX- CERVICAL SEVEN ARTHROPLASTY;  Surgeon: Kevan Ny Ditty, MD;  Location: [facility];  Service: Neurosurgery;  Laterality: N/A;  C6-7 Arthroplasty  . IRRIGATION AND DEBRIDEMENT ABSCESS Right 09/10/2014   Procedure: IRRIGATION AND DEBRIDEMENT RIGHT BREAST  ABSCESS;  Surgeon: Gayland Curry, MD;  Location: [facility];  Service: General;  Laterality: Right;  . WISDOM TOOTH EXTRACTION      Family History  Problem Relation Age of Onset  . Diabetes Father   . Breast cancer Sister   . Alcohol abuse Neg Hx   . Arthritis Neg Hx   . Asthma Neg Hx   . Birth defects Neg Hx   . Cancer Neg Hx   . COPD Neg Hx   . Depression Neg Hx   . Drug abuse Neg Hx   . Early death Neg Hx   . Hearing loss Neg Hx   . Heart disease Neg Hx   . Hyperlipidemia Neg Hx   . Hypertension Neg Hx   . Kidney disease Neg Hx   . Learning disabilities Neg Hx   .  Mental illness Neg Hx   . Mental retardation Neg Hx   . Miscarriages / Stillbirths Neg Hx   . Stroke Neg Hx   . Vision loss Neg Hx     Social History  Substance Use Topics  . Smoking status: Current Every Day Smoker    Packs/day: 1.50    Years: 7.00    Types: Cigarettes  . Smokeless tobacco: Never Used  . Alcohol use No    Allergies:  Allergies  Allergen Reactions  . Penicillins Nausea And Vomiting and Rash    Has patient had a PCN reaction causing immediate rash, facial/tongue/throat swelling, SOB or lightheadedness with hypotension: Yes Has patient had a PCN reaction causing severe rash involving mucus membranes or skin necrosis: Yes Has patient had a PCN reaction that required hospitalization No Has patient had a PCN reaction occurring within the last 10 years: No If all of the above answers are "NO", then may proceed with Cephalosporin use.    Prescriptions Prior to Admission  Medication Sig Dispense Refill Last Dose  . acetaminophen (TYLENOL) 325 MG tablet Take 2 tablets (650 mg total) by mouth every 6 (six) hours as needed for mild pain (or Temp > 100).   10/18/2016 at Unknown time  . gabapentin (NEURONTIN) 300 MG capsule Take 1 capsule (300 mg total) by mouth 3 (three) times daily. 90 capsule 2 10/18/2016 at Unknown time  . HYDROcodone-acetaminophen (NORCO) 7.5-325 MG tablet Take 1 tablet by mouth every 6 (six) hours as needed for moderate pain. 60 tablet 0 10/18/2016 at Unknown time  . methocarbamol (ROBAXIN) 750 MG tablet Take 1 tablet (750 mg total) by mouth every 6 (six) hours as needed for muscle spasms. 120 tablet 2 10/18/2016 at Unknown time  . albuterol (PROVENTIL HFA;VENTOLIN HFA) 108 (90 BASE) MCG/ACT inhaler Inhale 2 puffs into the lungs every 6 (six) hours as needed for wheezing or shortness of breath. Reported on 01/16/2016   PRN  . ibuprofen (ADVIL,MOTRIN) 600 MG tablet Take 1 tablet (600 mg total) by mouth every 6 (six) hours as needed. (Patient not taking: Reported  on 10/19/2016) 30 tablet 0 Not Taking at Unknown time  . naproxen (NAPROSYN) 375 MG tablet Take 1 tablet (375 mg total) by mouth 2 (two) times daily with a meal. (Patient not taking: Reported on 09/21/2016) 20 tablet 0 More than a month at Unknown time  . predniSONE (DELTASONE) 20 MG tablet 3 tabs po day one, then 2 po daily x 4 days (Patient not taking: Reported on 10/19/2016) 11 tablet 0 Not Taking at Unknown time  . traMADol (ULTRAM) 50 MG tablet Take 1 tablet (50 mg total) by mouth every 6 (six) hours as needed. (Patient not taking: Reported on 10/19/2016) 15 tablet 0 Not Taking at Unknown time   Results for orders placed or performed during the hospital encounter of 10/19/16 (from the past 48 hour(s))  Urinalysis, Routine w reflex microscopic (not at [facility])     Status: Abnormal   Collection Time: 10/19/16  9:34 AM  Result Value Ref Range   Color, Urine YELLOW YELLOW   APPearance CLEAR CLEAR   Specific Gravity, Urine <1.005 (L) 1.005 - 1.030   pH 6.0 5.0 - 8.0   Glucose, UA NEGATIVE NEGATIVE mg/dL   Hgb urine dipstick TRACE (A) NEGATIVE   Bilirubin Urine NEGATIVE NEGATIVE   Ketones, ur NEGATIVE NEGATIVE mg/dL   Protein, ur NEGATIVE NEGATIVE mg/dL   Nitrite NEGATIVE NEGATIVE   Leukocytes, UA NEGATIVE NEGATIVE  Pregnancy, urine POC     Status: None   Collection Time: 10/19/16  9:34 AM  Result Value Ref Range   Preg Test, Ur NEGATIVE NEGATIVE    Comment:        THE SENSITIVITY OF THIS METHODOLOGY IS >24 mIU/mL   Urine microscopic-add on     Status: Abnormal   Collection Time: 10/19/16  9:34 AM  Result Value Ref Range   Squamous Epithelial / LPF 0-5 (A) NONE SEEN   WBC, UA 0-5 0 - 5 WBC/hpf   RBC / HPF 0-5 0 - 5 RBC/hpf   Bacteria, UA NONE SEEN NONE SEEN    Review of Systems  Constitutional: Negative for chills and fever.  Gastrointestinal: Negative for abdominal pain.  Genitourinary: Negative for dysuria, flank pain, frequency, hematuria and urgency.   Physical Exam   Blood  pressure 126/80, pulse 89, temperature 98.8 F (37.1 C), temperature source Oral, resp. rate 16, weight 173 lb 12.8 oz (78.8 kg), last menstrual period 09/18/2016.  Physical Exam  Constitutional: She is oriented to person, place, and time. She appears well-developed and well-nourished. No distress.  HENT:  Head: Normocephalic.  Musculoskeletal: Normal range of motion.  Neurological: She is alert and oriented to person, place, and time.  Skin: Skin is warm. She is not diaphoretic.  Psychiatric: Her behavior is normal.    MAU Course  Procedures  None  MDM Wet prep and GC deferred due to recent monistat use.   Assessment and Plan   A:  1. Vaginal itching   2. Vaginitis and vulvovaginitis     P:  Discharge home in stable condition Rx: Diflucan Q72 hours X 3 days Discussed the use of MAU Encouraged PCP    Lezlie Lye, NP 10/19/2016 11:55 AM

## 2017-08-18 ENCOUNTER — Ambulatory Visit (HOSPITAL_COMMUNITY)
Admission: RE | Admit: 2017-08-18 | Discharge: 2017-08-18 | Disposition: A | Discharge status: Home / Self Care | Payer: Medicaid Other | Source: Ambulatory Visit | Attending: Family Medicine | Admitting: Family Medicine

## 2017-08-18 ENCOUNTER — Encounter (HOSPITAL_COMMUNITY): Payer: Self-pay

## 2017-08-18 ENCOUNTER — Encounter: Payer: Self-pay | Admitting: Family Medicine

## 2017-08-18 ENCOUNTER — Ambulatory Visit (INDEPENDENT_AMBULATORY_CARE_PROVIDER_SITE_OTHER): Payer: Medicaid Other | Admitting: Family Medicine

## 2017-08-18 VITALS — BP 133/94 | HR 74 | Temp 98.6°F | Resp 14 | Ht 65.0 in | Wt 181.0 lb

## 2017-08-18 DIAGNOSIS — Z23 Encounter for immunization: Secondary | ICD-10-CM | POA: Diagnosis not present

## 2017-08-18 DIAGNOSIS — M79672 Pain in left foot: Secondary | ICD-10-CM | POA: Diagnosis not present

## 2017-08-18 DIAGNOSIS — M25552 Pain in left hip: Secondary | ICD-10-CM

## 2017-08-18 LAB — POCT URINALYSIS DIP (DEVICE)
BILIRUBIN URINE: NEGATIVE
GLUCOSE, UA: NEGATIVE mg/dL
Hgb urine dipstick: NEGATIVE
Ketones, ur: NEGATIVE mg/dL
Leukocytes, UA: NEGATIVE
NITRITE: NEGATIVE
PH: 6.5 (ref 5.0–8.0)
Protein, ur: NEGATIVE mg/dL
Specific Gravity, Urine: 1.01 (ref 1.005–1.030)
Urobilinogen, UA: 0.2 mg/dL (ref 0.0–1.0)

## 2017-08-18 MED ORDER — GABAPENTIN 300 MG PO CAPS: 300.0000 mg | ORAL_CAPSULE | Freq: Three times a day (TID) | ORAL | 3 refills | Status: DC

## 2017-08-18 NOTE — Progress Notes (Signed)
Patient ID: Jasmin Stephenson, female    DOB: [birth year], 46 y.o.   MRN: [id number]  PCP: Scot Jun, FNP  Chief Complaint  Patient presents with  . Foot Pain    Left   Subjective:  HPI Jasmin Stephenson is a 46 y.o. female presents for evaluation of left foot pain. Jasmin Stephenson, a previously established patient here at the patient care center, was lost to follow-up more than 1 year ago. Today she report bilateral foot pain which is exacerbated by standing and prolonged walking. Issue today is isolated to left foot as pain is most significant. Reports a prior diagnosis of bone spurs in both feet. Previously followed by [facility] for podiatry care and reports a recent foot surgery. She requests a referral to a different podiatry office today. She also complains today of left hip pain which is worsened with standing and unbearable when lying on the left side. She denies suffering a prior hip injury and no known history of arthritis. She has attempted relief with OTC medication with only mild relief of symptoms. Social History   Social History  . Marital status: Single    Spouse name: N/A  . Number of children: N/A  . Years of education: N/A   Occupational History  . Not on file.   Social History Main Topics  . Smoking status: Current Every Day Smoker    Packs/day: 1.50    Years: 7.00    Types: Cigarettes  . Smokeless tobacco: Never Used  . Alcohol use No  . Drug use: Yes    Types: Marijuana     Comment: 2-3 months ago  . Sexual activity: Yes    Birth control/ protection: Surgical     Comment: last intercourse 27th Sep 2016   Other Topics Concern  . Not on file   Social History Narrative  . No narrative on file    Family History  Problem Relation Age of Onset  . Diabetes Father   . Breast cancer Sister   . Alcohol abuse Neg Hx   . Arthritis Neg Hx   . Asthma Neg Hx   . Birth defects Neg Hx   . Cancer Neg Hx   . COPD Neg Hx   . Depression Neg Hx   . Drug  abuse Neg Hx   . Early death Neg Hx   . Hearing loss Neg Hx   . Heart disease Neg Hx   . Hyperlipidemia Neg Hx   . Hypertension Neg Hx   . Kidney disease Neg Hx   . Learning disabilities Neg Hx   . Mental illness Neg Hx   . Mental retardation Neg Hx   . Miscarriages / Stillbirths Neg Hx   . Stroke Neg Hx   . Vision loss Neg Hx     Review of Systems See HPI   Patient Active Problem List   Diagnosis Date Noted  . 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 Hair loss 01/16/2016  . Pain in joint, ankle and foot 01/16/2016    Prior to Admission medications   Medication Sig Start Date End Date Taking? Authorizing Provider  acetaminophen (TYLENOL) 325 MG tablet Take 2 tablets (650 mg total) by mouth every 6 (six) hours as needed for mild pain (or Temp > 100). 09/12/14  Yes Riebock, Emina, NP  albuterol (PROVENTIL HFA;VENTOLIN HFA) 108 (90 BASE) MCG/ACT inhaler Inhale 2 puffs into the lungs every 6 (six) hours as needed  for wheezing or shortness of breath. Reported on 01/16/2016    [provider]  gabapentin (NEURONTIN) 300 MG capsule Take 1 capsule (300 mg total) by mouth 3 (three) times daily. Patient not taking: Reported on 08/18/2017 09/21/16   Ditty, Kevan Ny, MD  HYDROcodone-acetaminophen ([place]) 7.5-325 MG tablet Take 1 tablet by mouth every 6 (six) hours as needed for moderate pain. Patient not taking: Reported on 08/18/2017 09/21/16   Ditty, Kevan Ny, MD  methocarbamol (ROBAXIN) 750 MG tablet Take 1 tablet (750 mg total) by mouth every 6 (six) hours as needed for muscle spasms. Patient not taking: Reported on 08/18/2017 09/21/16   Ditty, Kevan Ny, MD    Past Medical, Surgical Family and Social History reviewed and updated.    Objective:   Today's Vitals   08/18/17 1111  BP: (!) 133/94  Pulse: 74  Resp: 14  Temp: 98.6 F (37 C)  TempSrc: Oral  SpO2: 100%  Weight: 181 lb (82.1 kg)  Height: 5\' 5"  (1.651 m)    Wt Readings  from Last 3 Encounters:  08/18/17 181 lb (82.1 kg)  10/19/16 173 lb 12.8 oz (78.8 kg)  09/21/16 170 lb (77.1 kg)   Physical Exam  Constitutional: She is oriented to person, place, and time. She appears well-developed and well-nourished.  HENT:  Head: Normocephalic and atraumatic.  Neck: Normal range of motion. Neck supple.  Cardiovascular: Normal rate, regular rhythm, normal heart sounds and intact distal pulses.   Pulmonary/Chest: Effort normal and breath sounds normal.  Musculoskeletal:       Left hip: She exhibits tenderness. She exhibits normal range of motion, normal strength, no bony tenderness and no crepitus.       Left foot: There is bony tenderness. There is normal range of motion, no swelling, normal capillary refill and no crepitus.  Neurological: She is alert and oriented to person, place, and time.  Skin: Skin is warm and dry.  Psychiatric: She has a normal mood and affect. Her behavior is normal. Judgment and thought content normal.   Assessment & Plan:  1. Left foot pain 2. Left hip pain 3. Need for influenza vaccination 4. Need for Tdap vaccination  For foot and hip pain will trial patient on a course of Gabapentin 300 mg, 3 times daily for pain. Obtaining imaging of left hip to rule out degenerative joint disease. If warranted or hip pain is unresponsive to Gabapentin, will refer patient to orthopedic surgery.  Meds ordered this encounter  Medications  . gabapentin (NEURONTIN) 300 MG capsule    Sig: Take 1 capsule (300 mg total) by mouth 3 (three) times daily.    Dispense:  90 capsule    Refill:  3    Order Specific Question:   Supervising Provider    Answer:   Tresa Garter [id number]    Orders Placed This Encounter  Procedures  . DG HIP UNILAT W OR W/O PELVIS 2-3 VIEWS LEFT  . Flu Vaccine QUAD 36+ mos IM  . Tdap vaccine greater than or equal to 7yo IM  . Ambulatory referral to Podiatry  . POCT urinalysis dip (device)   RTC: 3 months for complete  physical exam   Carroll Sage. Kenton Kingfisher, MSN, FNP-C [facility]  [address] [place], [place] [address] [phone]

## 2017-08-18 NOTE — Patient Instructions (Addendum)
Go down to obtain your left hip image in radiology today. For left foot pain, I have referred you to podiatry.  Return in 3 months to obtain a complete physical exam.

## 2017-09-15 ENCOUNTER — Ambulatory Visit: Payer: Self-pay | Admitting: Podiatry

## 2017-09-30 ENCOUNTER — Ambulatory Visit (INDEPENDENT_AMBULATORY_CARE_PROVIDER_SITE_OTHER): Payer: Medicaid Other

## 2017-09-30 ENCOUNTER — Encounter: Payer: Self-pay | Admitting: Podiatry

## 2017-09-30 ENCOUNTER — Other Ambulatory Visit: Payer: Self-pay | Admitting: Podiatry

## 2017-09-30 ENCOUNTER — Ambulatory Visit (INDEPENDENT_AMBULATORY_CARE_PROVIDER_SITE_OTHER): Payer: Medicaid Other | Admitting: Podiatry

## 2017-09-30 VITALS — BP 148/98 | HR 80 | Resp 16

## 2017-09-30 DIAGNOSIS — M7742 Metatarsalgia, left foot: Secondary | ICD-10-CM

## 2017-09-30 DIAGNOSIS — L989 Disorder of the skin and subcutaneous tissue, unspecified: Secondary | ICD-10-CM | POA: Diagnosis not present

## 2017-09-30 DIAGNOSIS — T8484XA Pain due to internal orthopedic prosthetic devices, implants and grafts, initial encounter: Secondary | ICD-10-CM

## 2017-09-30 DIAGNOSIS — M7741 Metatarsalgia, right foot: Secondary | ICD-10-CM | POA: Diagnosis not present

## 2017-09-30 DIAGNOSIS — M79672 Pain in left foot: Secondary | ICD-10-CM

## 2017-09-30 DIAGNOSIS — Q828 Other specified congenital malformations of skin: Secondary | ICD-10-CM

## 2017-09-30 NOTE — Progress Notes (Signed)
Subjective:  Patient ID: Jasmin Stephenson, female    DOB: 1971-10-26,  MRN: [id number]  Chief Complaint  Patient presents with  . Foot Pain    left foot pain on top and plantar forefoot; pt stated, "pain is 8/10 for about a year now, runs up to hip; has bilateral bone spurs; had previous surgery at different facility"   46 y.o. female presents with the above multiple complaints. Patient states that she has undergone foot surgery by a doctor at [facility] but she does remember the female doctor's name. States that she has residual pain to her left foot on the top and the bottom. Believes that there are broken pieces of metal in her foot. Also states that she had a biopsy of one of the lesions on the bottom of her left foot but is unsure what the result is. States that she saw a skin specialist after this visit for second opinion and states that the skin specialist disagreed with the interpretation by her former podiatrist; patient suggest that the second provider suggested calluses are due to bone spurs. Patient also reports that her neck was "broke" at her last surgery, but did not elaborate further.  Past Medical History:  Diagnosis Date  . Anxiety   . Arthritis    and tendonitis  . Asthma   . Breast calcification, right   . Cervical herniated disc   . Depression   . GERD (gastroesophageal reflux disease)   . Myocardial infarction ([facility])    " Mild"   Past Surgical History:  Procedure Laterality Date  . ABDOMINAL SURGERY    . APPENDECTOMY    . BLADDER SURGERY    . CERVICAL DISC ARTHROPLASTY N/A 09/21/2016   Procedure: CERVICAL SIX- CERVICAL SEVEN ARTHROPLASTY;  Surgeon: Kevan Ny Ditty, MD;  Location: [facility];  Service: Neurosurgery;  Laterality: N/A;  C6-7 Arthroplasty  . IRRIGATION AND DEBRIDEMENT ABSCESS Right 09/10/2014   Procedure: IRRIGATION AND DEBRIDEMENT RIGHT BREAST  ABSCESS;  Surgeon: Gayland Curry, MD;  Location: [facility];  Service: General;  Laterality: Right;  .  WISDOM TOOTH EXTRACTION      Current Outpatient Prescriptions:  .  acetaminophen (TYLENOL) 325 MG tablet, Take 2 tablets (650 mg total) by mouth every 6 (six) hours as needed for mild pain (or Temp > 100)., Disp: , Rfl:   Allergies  Allergen Reactions  . Penicillins Nausea And Vomiting and Rash    Has patient had a PCN reaction causing immediate rash, facial/tongue/throat swelling, SOB or lightheadedness with hypotension: Yes Has patient had a PCN reaction causing severe rash involving mucus membranes or skin necrosis: Yes Has patient had a PCN reaction that required hospitalization No Has patient had a PCN reaction occurring within the last 10 years: No If all of the above answers are "NO", then may proceed with Cephalosporin use.   Review of Systems Objective:   Vitals:   09/30/17 1041  BP: (!) 148/98  Pulse: 80  Resp: 16   General AA&O x3. Normal mood and affect.  Vascular Dorsalis pedis and posterior tibial pulses  present 2+ bilaterally  Capillary refill normal to all digits. Pedal hair growth normal.  Neurologic Epicritic sensation grossly present.  Dermatologic No open lesions. Interspaces clear of maceration. Nails well groomed and normal in appearance. Soft corn left fifth PIPJ  Healed incision right fifth toe  Healed incisions left first metatarsal and second metatarsal  Multiple midfoot and heel punctate keratoses  Punctate keratoses distal forefoot underneath hallux  bilateral second metatarsal right.   Orthopedic: MMT 5/5 in dorsiflexion, plantarflexion, inversion, and eversion. Normal joint ROM without pain or crepitus. Pain palpation dorsal second metatarsal, first metatarsal  The patient left fifth toe with adductovarus deformity    Radiographs: Taken and reviewed. Evidence of left second metatarsal osteotomy with pin fixation. Pin prominent dorsally with evidence of backing out due to clear tract around the pin. First metatarsal osteotomy with 2 pin fixation.  Osteotomy line still visible on lateral x-ray. Left fifth toe adductovarus deformity.  Assessment & Plan:  Patient was evaluated and treated and all questions answered.  Retained painful orthopedic hardware -Patient is insistent that she had either 2 pins or a broken pin in her left second metatarsal. Discussed that I do not see any evidence of the broken pin and in absence of surgery to remove said second pin the pin should still be evident on x-ray. Advised there were 2 pins in her first metatarsal however patient was insistent that she was referring to the second metatarsal bone. -Patient likely would benefit from removal of the second metatarsal pin as it is likely shifted since his original positioning. Will request records from prior providers. -Patient is an active smoker without intention to quit per last PCP note. Ideally will not consider surgical intervention without good faith attempt to quit. Patient states that the biopsy on the bottom of her foot took months to heal indicating potentially poor healing potential which is of increased risk with continued tobacco use.  Left fifth toe hammertoe  -Patient indicates she wishes surgical intervention at some point. -Recommended padding in the meantime.  Porokeratosis -Per patient had biopsy but is unclear results. We will attempt to obtain prior results.  -Advised that the punctate keratoses underneath bony weightbearing surfaces are likely due to the underlying bone and that the ones in her midfoot and heel are more likely blocked sweat ducts. Patient continues to state she believes they're caused by bone spurs.  -6 lesions debrided secondary to pain  Procedure: Callus Debridement Rationale: Painful porokeratosis Type of Debridement: sharp Instrumentation: 15 blade Number of Calluses: 6  30 minutes of face to face time were spent with the patient. >50% of this was spent on counseling and coordination of care. Specifically discussed  with patient the above diagnoses and treatment plans.

## 2017-10-01 ENCOUNTER — Encounter: Payer: Self-pay | Admitting: Podiatry

## 2017-11-11 ENCOUNTER — Ambulatory Visit: Payer: Medicaid Other | Admitting: Podiatry

## 2017-11-17 ENCOUNTER — Ambulatory Visit (INDEPENDENT_AMBULATORY_CARE_PROVIDER_SITE_OTHER): Payer: Medicaid Other | Admitting: Family Medicine

## 2017-11-17 ENCOUNTER — Encounter: Payer: Self-pay | Admitting: Family Medicine

## 2017-11-17 ENCOUNTER — Ambulatory Visit (HOSPITAL_COMMUNITY)
Admission: RE | Admit: 2017-11-17 | Discharge: 2017-11-17 | Disposition: A | Discharge status: Home / Self Care | Payer: Medicaid Other | Source: Ambulatory Visit | Attending: Family Medicine | Admitting: Family Medicine

## 2017-11-17 VITALS — BP 133/100 | HR 87 | Temp 98.2°F | Resp 16 | Ht 65.0 in | Wt 180.2 lb

## 2017-11-17 DIAGNOSIS — G8929 Other chronic pain: Secondary | ICD-10-CM | POA: Diagnosis not present

## 2017-11-17 DIAGNOSIS — Z1231 Encounter for screening mammogram for malignant neoplasm of breast: Secondary | ICD-10-CM

## 2017-11-17 DIAGNOSIS — M25552 Pain in left hip: Secondary | ICD-10-CM | POA: Insufficient documentation

## 2017-11-17 DIAGNOSIS — Z01419 Encounter for gynecological examination (general) (routine) without abnormal findings: Secondary | ICD-10-CM | POA: Diagnosis not present

## 2017-11-17 DIAGNOSIS — Z13220 Encounter for screening for lipoid disorders: Secondary | ICD-10-CM

## 2017-11-17 DIAGNOSIS — Z131 Encounter for screening for diabetes mellitus: Secondary | ICD-10-CM | POA: Diagnosis not present

## 2017-11-17 DIAGNOSIS — Z1239 Encounter for other screening for malignant neoplasm of breast: Secondary | ICD-10-CM

## 2017-11-17 DIAGNOSIS — Z1329 Encounter for screening for other suspected endocrine disorder: Secondary | ICD-10-CM | POA: Diagnosis not present

## 2017-11-17 DIAGNOSIS — Z130 Encounter for screening for diseases of the blood and blood-forming organs and certain disorders involving the immune mechanism: Secondary | ICD-10-CM | POA: Diagnosis not present

## 2017-11-17 LAB — POCT URINALYSIS DIP (DEVICE)
BILIRUBIN URINE: NEGATIVE
GLUCOSE, UA: NEGATIVE mg/dL
HGB URINE DIPSTICK: NEGATIVE
Ketones, ur: NEGATIVE mg/dL
NITRITE: NEGATIVE
Protein, ur: NEGATIVE mg/dL
UROBILINOGEN UA: 0.2 mg/dL (ref 0.0–1.0)
pH: 6.5 (ref 5.0–8.0)

## 2017-11-17 LAB — POCT URINE PREGNANCY: Preg Test, Ur: NEGATIVE

## 2017-11-17 MED ORDER — ALBUTEROL SULFATE HFA 108 (90 BASE) MCG/ACT IN AERS: 2.0000 | INHALATION_SPRAY | RESPIRATORY_TRACT | 1 refills | Status: DC | PRN

## 2017-11-17 NOTE — Patient Instructions (Addendum)
[email or web address]

## 2017-11-17 NOTE — Progress Notes (Signed)
Jasmin Stephenson, is a 46 y.o. female  FKC:[id number]  VCB:[id number]  DOB - August 25, 1971  CC:  Chief Complaint  Patient presents with  . Annual Exam      HPI: Jasmin Stephenson is a 46 y.o. female is here today for well women exam. Medical problems significant for cervical disc disorder, current daily tobacco use, and chronic left hip pain. Jasmin Stephenson reports overall good health. She has oral cavities and is in need of dental evaluation. She doesn't wear corrective lenses. Last PAP several years ago, to her knowledge, results were negative. She continues to have menstrual cycle. Patient's last menstrual period was 11/12/2017. Denies pelvic cramping, pain with sexual intercourse, vaginal irritation, and or discharge. During previous office visit, she complained of left hip pain and was referred to radiology to obtain an x-ray of left hip. She did not obtain and continues to experience left hip pain. Denies any recent falls, weakness, or gait instability. Jasmin Stephenson denies headache, chest pain,  abdominal pain, cough and or shortness of breath.  Allergies  Allergen Reactions  . Penicillins Nausea And Vomiting and Rash    Has patient had a PCN reaction causing immediate rash, facial/tongue/throat swelling, SOB or lightheadedness with hypotension: Yes Has patient had a PCN reaction causing severe rash involving mucus membranes or skin necrosis: Yes Has patient had a PCN reaction that required hospitalization No Has patient had a PCN reaction occurring within the last 10 years: No If all of the above answers are "NO", then may proceed with Cephalosporin use.   Past Medical History:  Diagnosis Date  . Anxiety   . Arthritis    and tendonitis  . Asthma   . Breast calcification, right   . Cervical herniated disc   . Depression   . GERD (gastroesophageal reflux disease)   . Myocardial infarction [facility])    " Mild"   Current Outpatient Medications on File Prior to Visit  Medication Sig Dispense Refill  .  acetaminophen (TYLENOL) 325 MG tablet Take 2 tablets (650 mg total) by mouth every 6 (six) hours as needed for mild pain (or Temp > 100). (Patient not taking: Reported on 11/17/2017)     No current facility-administered medications on file prior to visit.    Family History  Problem Relation Age of Onset  . Diabetes Father   . Breast cancer Sister   . Alcohol abuse Neg Hx   . Arthritis Neg Hx   . Asthma Neg Hx   . Birth defects Neg Hx   . Cancer Neg Hx   . COPD Neg Hx   . Depression Neg Hx   . Drug abuse Neg Hx   . Early death Neg Hx   . Hearing loss Neg Hx   . Heart disease Neg Hx   . Hyperlipidemia Neg Hx   . Hypertension Neg Hx   . Kidney disease Neg Hx   . Learning disabilities Neg Hx   . Mental illness Neg Hx   . Mental retardation Neg Hx   . Miscarriages / Stillbirths Neg Hx   . Stroke Neg Hx   . Vision loss Neg Hx    Social History   Socioeconomic History  . Marital status: Single    Spouse name: Not on file  . Number of children: Not on file  . Years of education: Not on file  . Highest education level: Not on file  Social Needs  . Financial resource strain: Not on file  . Food insecurity - worry: Not on  file  . Food insecurity - inability: Not on file  . Transportation needs - medical: Not on file  . Transportation needs - non-medical: Not on file  Occupational History  . Not on file  Tobacco Use  . Smoking status: Current Every Day Smoker    Packs/day: 1.50    Years: 7.00    Pack years: 10.50    Types: Cigarettes  . Smokeless tobacco: Never Used  Substance and Sexual Activity  . Alcohol use: No  . Drug use: Yes    Types: Marijuana    Comment: 2-3 months ago  . Sexual activity: Yes    Birth control/protection: Surgical    Comment: last intercourse 27th Sep 2016  Other Topics Concern  . Not on file  Social History Narrative  . Not on file    Review of Systems: Constitutional: Negative for fever, chills, diaphoresis, activity change, appetite  change and fatigue. HENT: Negative for ear pain, nosebleeds, congestion, facial swelling, rhinorrhea, neck pain, neck stiffness and ear discharge.  Eyes: Negative for pain, discharge, redness, itching and visual disturbance. Respiratory: Negative for cough, choking, chest tightness, shortness of breath, wheezing and stridor.  Cardiovascular: Negative for chest pain, palpitations and leg swelling. Gastrointestinal: Negative for abdominal distention. Genitourinary: Negative for dysuria, urgency, frequency, hematuria, flank pain, decreased urine volume, difficulty urinating and dyspareunia.  Musculoskeletal: Negative for back pain, joint swelling, arthralgia and gait problem. Neurological: Negative for dizziness, tremors, seizures, syncope, facial asymmetry, speech difficulty, weakness, light-headedness, numbness and headaches.  Hematological: Negative for adenopathy. Does not bruise/bleed easily. Psychiatric/Behavioral: Negative for hallucinations, behavioral problems, confusion, dysphoric mood, decreased concentration and agitation.    Objective:   Vitals:   11/17/17 1045  BP: (!) 133/100  Pulse: 87  Resp: 16  Temp: 98.2 F (36.8 C)  SpO2: 99%    Physical Exam: Constitutional: Patient appears well-developed and well-nourished. No distress. HENT: Normocephalic, atraumatic, External right and left ear normal. Oropharynx is clear and moist.  Eyes: Conjunctivae and EOM are normal. PERRLA, no scleral icterus. Neck: Normal ROM. Neck supple. No JVD. No tracheal deviation. No thyromegaly. CVS: RRR, S1/S2 +, no murmurs, no gallops, no carotid bruit.  Pulmonary: Effort and breath sounds normal, no stridor, rhonchi, wheezes, rales.  Abdominal: Soft. BS +, no distension, tenderness, rebound or guarding.  Musculoskeletal: Normal range of motion. No edema and no tenderness.  Genitourinary: Breasts are symmetric without cutaneous changes, nipple inversion or discharge. No masses or tenderness, and  no axillary lymphadenopathy. Normal female external genitalia without lesion. No inguinal lymphadenopathy. Vaginal mucosa is pink and moist without lesions. Cervix is without discharge, not friable. Pap smear obtained. No cervical motion tenderness, adnexal fullness or tenderness. Lymphadenopathy: No lymphadenopathy noted, cervical, inguinal or axillary Neuro: Alert. Normal reflexes, muscle tone coordination. No cranial nerve deficit. Skin: Skin is warm and dry. No rash noted. Not diaphoretic. No erythema. No pallor. Psychiatric: Normal mood and affect. Behavior, judgment, thought content normal.  Lab Results  Component Value Date   WBC 7.4 09/21/2016   HGB 13.3 09/21/2016   HCT 41.0 09/21/2016   MCV 89.1 09/21/2016   PLT 397 09/21/2016   Lab Results  Component Value Date   CREATININE 0.91 09/21/2016   BUN 9 09/21/2016   NA 138 09/21/2016   K 4.0 09/21/2016   CL 110 09/21/2016   CO2 20 (L) 09/21/2016    Lab Results  Component Value Date   HGBA1C 5.4 01/16/2016   Lipid Panel     Component Value Date/Time  CHOL 189 01/16/2016 1151   TRIG 80 01/16/2016 1151   HDL 55 01/16/2016 1151   CHOLHDL 3.4 01/16/2016 1151   VLDL 16 01/16/2016 1151   LDLCALC 118 01/16/2016 1151        Assessment and plan:  1. Encounter for well woman exam, UA and urine pregnancy negative,  - Pap IG, CT/NG w/ reflex HPV when ASC-U-pending  -Age appropriate anticipatory guidance provided.  2. Screening for thyroid disorder- Thyroid Panel With TSH 3. Screening, lipid- Lipid panel 4. Screening for diabetes mellitus, - Comprehensive metabolic panel and - Hemoglobin A1c pending  5. Screening for deficiency anemia - CBC with Differential 6. Screening Breast Cancer-bilateral mammography screening ordered    Orders Placed This Encounter  Procedures  . MM DIAG BREAST TOMO BILATERAL  . Lipid panel  . CBC with Differential  . Comprehensive metabolic panel  . Thyroid Panel With TSH  . Hemoglobin A1c  .  POCT urine pregnancy  . POCT urinalysis dip (device)    The patient was given clear instructions to go to ER or return to medical center if symptoms don't improve, worsen or new problems develop. The patient verbalized understanding. The patient was told to call to get lab results if they haven't heard anything in the next week.    RTC: 3 months chronic condition management     This note has been created with Surveyor, quantity. Any transcriptional errors are unintentional.

## 2017-11-18 LAB — COMPREHENSIVE METABOLIC PANEL
ALBUMIN: 4.3 g/dL (ref 3.5–5.5)
ALK PHOS: 99 IU/L (ref 39–117)
ALT: 11 IU/L (ref 0–32)
AST: 16 IU/L (ref 0–40)
Albumin/Globulin Ratio: 1.3 (ref 1.2–2.2)
BILIRUBIN TOTAL: 0.3 mg/dL (ref 0.0–1.2)
BUN / CREAT RATIO: 4 — AB (ref 9–23)
BUN: 4 mg/dL — ABNORMAL LOW (ref 6–24)
CHLORIDE: 106 mmol/L (ref 96–106)
CO2: 22 mmol/L (ref 20–29)
Calcium: 9.5 mg/dL (ref 8.7–10.2)
Creatinine, Ser: 0.94 mg/dL (ref 0.57–1.00)
GFR calc Af Amer: 85 mL/min/{1.73_m2} (ref >59)
GFR calc non Af Amer: 74 mL/min/{1.73_m2} (ref >59)
GLUCOSE: 88 mg/dL (ref 65–99)
Globulin, Total: 3.2 g/dL (ref 1.5–4.5)
Potassium: 4.2 mmol/L (ref 3.5–5.2)
Sodium: 142 mmol/L (ref 134–144)
Total Protein: 7.5 g/dL (ref 6.0–8.5)

## 2017-11-18 LAB — CBC WITH DIFFERENTIAL/PLATELET
BASOS ABS: 0 10*3/uL (ref 0.0–0.2)
Basos: 0 %
EOS (ABSOLUTE): 0.1 10*3/uL (ref 0.0–0.4)
Eos: 1 %
Hematocrit: 37.4 % (ref 34.0–46.6)
Hemoglobin: 11.7 g/dL (ref 11.1–15.9)
Immature Grans (Abs): 0 10*3/uL (ref 0.0–0.1)
Immature Granulocytes: 0 %
LYMPHS ABS: 3.1 10*3/uL (ref 0.7–3.1)
Lymphs: 37 %
MCH: 27 pg (ref 26.6–33.0)
MCHC: 31.3 g/dL — AB (ref 31.5–35.7)
MCV: 86 fL (ref 79–97)
MONOCYTES: 11 %
MONOS ABS: 0.9 10*3/uL (ref 0.1–0.9)
NEUTROS ABS: 4.1 10*3/uL (ref 1.4–7.0)
Neutrophils: 51 %
PLATELETS: 517 10*3/uL — AB (ref 150–379)
RBC: 4.34 x10E6/uL (ref 3.77–5.28)
RDW: 17.8 % — AB (ref 12.3–15.4)
WBC: 8.2 10*3/uL (ref 3.4–10.8)

## 2017-11-18 LAB — THYROID PANEL WITH TSH
Free Thyroxine Index: 2.6 (ref 1.2–4.9)
T3 Uptake Ratio: 29 % (ref 24–39)
T4 TOTAL: 8.8 ug/dL (ref 4.5–12.0)
TSH: 1.18 u[IU]/mL (ref 0.450–4.500)

## 2017-11-18 LAB — HEMOGLOBIN A1C
Est. average glucose Bld gHb Est-mCnc: 105 mg/dL
Hgb A1c MFr Bld: 5.3 % (ref 4.8–5.6)

## 2017-11-18 LAB — LIPID PANEL
CHOLESTEROL TOTAL: 167 mg/dL (ref 100–199)
Chol/HDL Ratio: 3.6 ratio (ref 0.0–4.4)
HDL: 46 mg/dL (ref >39)
LDL CALC: 99 mg/dL (ref 0–99)
Triglycerides: 112 mg/dL (ref 0–149)
VLDL CHOLESTEROL CAL: 22 mg/dL (ref 5–40)

## 2017-11-19 ENCOUNTER — Ambulatory Visit: Payer: Medicaid Other | Admitting: Podiatry

## 2017-11-19 ENCOUNTER — Encounter: Payer: Self-pay | Admitting: Podiatry

## 2017-11-19 DIAGNOSIS — M216X9 Other acquired deformities of unspecified foot: Secondary | ICD-10-CM | POA: Diagnosis not present

## 2017-11-19 DIAGNOSIS — M7742 Metatarsalgia, left foot: Secondary | ICD-10-CM

## 2017-11-19 DIAGNOSIS — M7741 Metatarsalgia, right foot: Secondary | ICD-10-CM

## 2017-11-19 DIAGNOSIS — L989 Disorder of the skin and subcutaneous tissue, unspecified: Secondary | ICD-10-CM

## 2017-11-19 DIAGNOSIS — T8484XA Pain due to internal orthopedic prosthetic devices, implants and grafts, initial encounter: Secondary | ICD-10-CM

## 2017-11-19 DIAGNOSIS — Q828 Other specified congenital malformations of skin: Secondary | ICD-10-CM

## 2017-11-22 ENCOUNTER — Encounter: Payer: Self-pay | Admitting: Family Medicine

## 2017-11-22 LAB — PAP IG, CT-NG, RFX HPV ASCU
Chlamydia, Nuc. Acid Amp: NEGATIVE
Gonococcus by Nucleic Acid Amp: NEGATIVE
PAP Smear Comment: 0

## 2017-11-22 NOTE — Progress Notes (Signed)
Mail lab letter  

## 2017-11-25 ENCOUNTER — Telehealth: Payer: Self-pay

## 2017-11-25 DIAGNOSIS — M25552 Pain in left hip: Secondary | ICD-10-CM

## 2017-11-25 NOTE — Telephone Encounter (Signed)
Patient would like a referral to Dr. Berle Mull at [facility] for a second opinion.

## 2017-11-25 NOTE — Telephone Encounter (Signed)
Placing order for orthopedic referral to [facility] office per patient's request for evaluation of chronic left hip pain. Recent imaging negative for any acute findings. Patient is currently prescribed gabapentin without significant relief of symptoms. Referral placed today.  Jasmin Stephenson. Kenton Kingfisher, MSN, FNP-C [facility]  [address] [place], [place] [address] [phone]

## 2017-11-27 ENCOUNTER — Other Ambulatory Visit: Payer: Self-pay | Admitting: Family Medicine

## 2017-11-27 DIAGNOSIS — Z1231 Encounter for screening mammogram for malignant neoplasm of breast: Secondary | ICD-10-CM

## 2017-12-15 ENCOUNTER — Ambulatory Visit: Payer: Medicaid Other

## 2017-12-17 ENCOUNTER — Ambulatory Visit: Payer: Medicaid Other | Admitting: Podiatry

## 2017-12-20 ENCOUNTER — Ambulatory Visit
Admission: RE | Admit: 2017-12-20 | Discharge: 2017-12-20 | Disposition: A | Discharge status: Home / Self Care | Payer: Medicaid Other | Source: Ambulatory Visit | Attending: Family Medicine | Admitting: Family Medicine

## 2017-12-20 DIAGNOSIS — Z1231 Encounter for screening mammogram for malignant neoplasm of breast: Secondary | ICD-10-CM

## 2017-12-21 ENCOUNTER — Other Ambulatory Visit: Payer: Self-pay | Admitting: Family Medicine

## 2017-12-21 DIAGNOSIS — R928 Other abnormal and inconclusive findings on diagnostic imaging of breast: Secondary | ICD-10-CM

## 2017-12-24 ENCOUNTER — Ambulatory Visit
Admission: RE | Admit: 2017-12-24 | Discharge: 2017-12-24 | Disposition: A | Discharge status: Home / Self Care | Payer: Medicaid Other | Source: Ambulatory Visit | Attending: Family Medicine | Admitting: Family Medicine

## 2017-12-24 ENCOUNTER — Ambulatory Visit: Payer: Medicaid Other

## 2017-12-24 DIAGNOSIS — R928 Other abnormal and inconclusive findings on diagnostic imaging of breast: Secondary | ICD-10-CM

## 2018-01-02 NOTE — Progress Notes (Signed)
  Subjective:  Patient ID: Jasmin Stephenson, female    DOB: October 20, 1971,  MRN: [id number]  Chief Complaint  Patient presents with  . Foot Pain    Follow up painful internal hardware left foot   "Its worse"   47 y.o. female returns for the above complaint.  States that her pain is worse still concerned about the pin that it is in her foot.  Objective:  There were no vitals filed for this visit. General AA&O x3. Normal mood and affect.  Vascular Pedal pulses palpable.  Neurologic Epicritic sensation grossly intact.  Dermatologic No open lesions. Skin normal texture and turgor.  Porokeratosis to the midfoot and heel  Orthopedic:  Pain to palpation dorsal second metatarsal, first metatarsal, fifth hammertoe left foot   Assessment & Plan:  Patient was evaluated and treated and all questions answered.  Retained painful orthopedic hardware, with porokeratosis -Discussed need for obtaining records from prior physician prior to any further surgical discussion.  Patient filled out form today for his previous physician's records.  Left fifth hammertoe -We will discuss concomitant correction after obtaining records to determine surgical plan  Return in about 4 weeks (around 12/17/2017) for F/u Capsulitis.

## 2018-01-06 ENCOUNTER — Telehealth: Payer: Self-pay | Admitting: *Deleted

## 2018-01-06 ENCOUNTER — Ambulatory Visit: Payer: Medicaid Other | Admitting: Podiatry

## 2018-01-06 DIAGNOSIS — Z969 Presence of functional implant, unspecified: Secondary | ICD-10-CM

## 2018-01-06 DIAGNOSIS — M2042 Other hammer toe(s) (acquired), left foot: Secondary | ICD-10-CM

## 2018-01-06 DIAGNOSIS — M779 Enthesopathy, unspecified: Secondary | ICD-10-CM

## 2018-01-06 DIAGNOSIS — T8484XA Pain due to internal orthopedic prosthetic devices, implants and grafts, initial encounter: Secondary | ICD-10-CM

## 2018-01-06 NOTE — Telephone Encounter (Signed)
I left a message for Jasmin Stephenson that I was scheduling her for February 6.  I asked her to call me if this date is not good for her.

## 2018-01-06 NOTE — Patient Instructions (Signed)
Pre-Operative Instructions  Congratulations, you have decided to take an important step towards improving your quality of life.  You can be assured that the doctors and staff at [facility] will be with you every step of the way.  Here are some important things you should know:  1. Plan to be at the surgery center/hospital at least 1 (one) hour prior to your scheduled time, unless otherwise directed by the surgical center/hospital staff.  You must have a responsible adult accompany you, remain during the surgery and drive you home.  Make sure you have directions to the surgical center/hospital to ensure you arrive on time. 2. If you are having surgery at [facility] or [facility], you will need a copy of your medical history and physical form from your family physician within one month prior to the date of surgery. We will give you a form for your primary physician to complete.  3. We make every effort to accommodate the date you request for surgery.  However, there are times where surgery dates or times have to be moved.  We will contact you as soon as possible if a change in schedule is required.   4. No aspirin/ibuprofen for one week before surgery.  If you are on aspirin, any non-steroidal anti-inflammatory medications (Mobic, Aleve, Ibuprofen) should not be taken seven (7) days prior to your surgery.  You make take Tylenol for pain prior to surgery.  5. Medications - If you are taking daily heart and blood pressure medications, seizure, reflux, allergy, asthma, anxiety, pain or diabetes medications, make sure you notify the surgery center/hospital before the day of surgery so they can tell you which medications you should take or avoid the day of surgery. 6. No food or drink after midnight the night before surgery unless directed otherwise by surgical center/hospital staff. 7. No alcoholic beverages 24-hours prior to surgery.  No smoking 24-hours prior or 24-hours after  surgery. 8. Wear loose pants or shorts. They should be loose enough to fit over bandages, boots, and casts. 9. Don't wear slip-on shoes. Sneakers are preferred. 10. Bring your boot with you to the surgery center/hospital.  Also bring crutches or a walker if your physician has prescribed it for you.  If you do not have this equipment, it will be provided for you after surgery. 11. If you have not been contacted by the surgery center/hospital by the day before your surgery, call to confirm the date and time of your surgery. 12. Leave-time from work may vary depending on the type of surgery you have.  Appropriate arrangements should be made prior to surgery with your employer. 13. Prescriptions will be provided immediately following surgery by your doctor.  Fill these as soon as possible after surgery and take the medication as directed. Pain medications will not be refilled on weekends and must be approved by the doctor. 14. Remove nail polish on the operative foot and avoid getting pedicures prior to surgery. 15. Wash the night before surgery.  The night before surgery wash the foot and leg well with water and the antibacterial soap provided. Be sure to pay special attention to beneath the toenails and in between the toes.  Wash for at least three (3) minutes. Rinse thoroughly with water and dry well with a towel.  Perform this wash unless told not to do so by your physician.  Enclosed: 1 Ice pack (please put in freezer the night before surgery)   1 Hibiclens skin cleaner     Pre-op instructions  If you have any questions regarding the instructions, please do not hesitate to call our office.  [place]: [address], [place], [place] [address] -- [phone]  [place]: [address] [place], [place] [address] -- [phone]  [place]: [address]  , [place] [address] -- [phone]  [place]: [address], [address], [place],  [address] -- [phone]  Website:  [email or web address] 

## 2018-01-13 NOTE — Progress Notes (Signed)
  Subjective:  Patient ID: Jasmin Stephenson, female    DOB: 28-Jul-1971,  MRN: [id number]  Chief Complaint  Patient presents with  . Foot Pain    4 weeks follow up   47 y.o. female returns for the above complaint.  Here for discussion of surgery.  States that her foot is doing problems and she would like to get it corrected.  Objective:  There were no vitals filed for this visit. General AA&O x3. Normal mood and affect.  Vascular Pedal pulses palpable.  Neurologic Epicritic sensation grossly intact.  Dermatologic Multiple hyperkeratotic areas left foot some with punctate cores, some due to underlying metatarsal pressure.  Orthopedic: Pain to palpation left fifth toe hammertoe with adductovarus position. Pain to palpation dorsal second MPJ.   Assessment & Plan:  Patient was evaluated and treated and all questions answered.  Retained painful orthopedic hardware second metatarsal left foot -Discussed simply removing the hardware can attempt to alleviate some of patient's pain.  Advised that removing the hardware will have no effect on her callused areas.  Discussed that surgical plan is only to remove the hardware and to also correct her hammertoe and not address any the other callus areas.  Patient verbalized understanding and agreement  Left foot fifth toe hammertoe -We will plan for derotation arthroplasty of the fifth toe.  All risks benefits alternatives explained.  No guarantees of surgery given.  Consent form reviewed and signed by patient.  We will plan for surgery by date to be determined by the surgical coordinator.  15 minutes of face to face time were spent with the patient. >50% of this was spent on counseling and coordination of care. Specifically discussed with patient the above diagnoses and treatment plans.  No Follow-up on file.

## 2018-01-19 ENCOUNTER — Encounter: Payer: Self-pay | Admitting: Podiatry

## 2018-01-19 ENCOUNTER — Other Ambulatory Visit: Payer: Self-pay | Admitting: Podiatry

## 2018-01-19 ENCOUNTER — Telehealth: Payer: Self-pay | Admitting: *Deleted

## 2018-01-19 DIAGNOSIS — M2012 Hallux valgus (acquired), left foot: Secondary | ICD-10-CM | POA: Diagnosis not present

## 2018-01-19 DIAGNOSIS — M2042 Other hammer toe(s) (acquired), left foot: Secondary | ICD-10-CM | POA: Diagnosis not present

## 2018-01-19 DIAGNOSIS — M25572 Pain in left ankle and joints of left foot: Secondary | ICD-10-CM | POA: Diagnosis not present

## 2018-01-19 MED ORDER — OXYCODONE-ACETAMINOPHEN 10-325 MG PO TABS: 1.0000 | ORAL_TABLET | ORAL | 0 refills | Status: DC | PRN

## 2018-01-19 MED ORDER — ONDANSETRON HCL 4 MG PO TABS: 4.0000 mg | ORAL_TABLET | Freq: Three times a day (TID) | ORAL | 0 refills | Status: DC | PRN

## 2018-01-19 MED ORDER — CLINDAMYCIN HCL 150 MG PO CAPS: 150.0000 mg | ORAL_CAPSULE | Freq: Two times a day (BID) | ORAL | 0 refills | Status: DC

## 2018-01-19 NOTE — Telephone Encounter (Signed)
Pt states she is calling because her pharmacy says she has to have a prior authorization before she gets her pain medication. I told pt I had sent the pre-cert to Medicaid immediately after speaking with her caregiver, and she could contact the pharmacy to check cost of medication for self-pay. Pt says she is in pain and I told her she would generally have at least 4-6 hours of postop anesthesia, which should give her caregiver time to get the medication.

## 2018-01-19 NOTE — Progress Notes (Signed)
Resent postop medications as prescribed by Dr. March Rummage. Patient called and was at the pharmacy and they said they didn't receive it.

## 2018-01-20 ENCOUNTER — Telehealth: Payer: Self-pay | Admitting: *Deleted

## 2018-01-20 NOTE — Telephone Encounter (Signed)
Called and left a message for the patient to call me back, I was checking on patient after surgery yesterday and to call the [facility] at [phone]. Lattie Haw

## 2018-01-26 ENCOUNTER — Ambulatory Visit (INDEPENDENT_AMBULATORY_CARE_PROVIDER_SITE_OTHER): Payer: Medicaid Other

## 2018-01-26 ENCOUNTER — Encounter: Payer: Self-pay | Admitting: Podiatry

## 2018-01-26 ENCOUNTER — Ambulatory Visit (INDEPENDENT_AMBULATORY_CARE_PROVIDER_SITE_OTHER): Payer: Medicaid Other | Admitting: Podiatry

## 2018-01-26 ENCOUNTER — Encounter: Payer: Medicaid Other | Admitting: Podiatry

## 2018-01-26 DIAGNOSIS — T8484XA Pain due to internal orthopedic prosthetic devices, implants and grafts, initial encounter: Secondary | ICD-10-CM | POA: Diagnosis not present

## 2018-01-26 DIAGNOSIS — M2042 Other hammer toe(s) (acquired), left foot: Secondary | ICD-10-CM

## 2018-01-26 MED ORDER — OXYCODONE-ACETAMINOPHEN 10-325 MG PO TABS: 1.0000 | ORAL_TABLET | ORAL | 0 refills | Status: DC | PRN

## 2018-01-27 NOTE — Progress Notes (Signed)
Subjective:   Patient ID: Jasmin Stephenson, female   DOB: 47 y.o.   MRN: [id number]   HPI Patient states doing really well with the left foot with minimal discomfort and has done a good job of elevation and wearing her dress   ROS      Objective:  Physical Exam  Neurovascular status intact with patient's digit in good alignment with wound edges well coapted and top of second metatarsal in good alignment wound edges well coapted.  Negative Homans sign was noted there is no drainage or proximal edema erythema noted     Assessment:  Doing well post arthroplasty fifth digit left and removal of fixation left second metatarsal     Plan:  H&P x-ray reviewed and reapplied sterile dressing.  Instructed on continued elevation compression immobilization and reappoint 1 week for suture removal and to see Dr. March Rummage or earlier if any issues should occur  X-ray indicates satisfactory position fifth digit with satisfactory resection of bone and removal fixation second metatarsal

## 2018-02-02 ENCOUNTER — Encounter: Payer: Self-pay | Admitting: Podiatry

## 2018-02-02 ENCOUNTER — Ambulatory Visit (INDEPENDENT_AMBULATORY_CARE_PROVIDER_SITE_OTHER): Payer: Medicaid Other | Admitting: Podiatry

## 2018-02-02 ENCOUNTER — Encounter: Payer: Medicaid Other | Admitting: Podiatry

## 2018-02-02 DIAGNOSIS — M2042 Other hammer toe(s) (acquired), left foot: Secondary | ICD-10-CM

## 2018-02-02 NOTE — Progress Notes (Signed)
  Subjective:  Patient ID: Jasmin Stephenson, female    DOB: 10/24/1971,  MRN: [id number]  Chief Complaint  Patient presents with  . Routine Post [employer] 02.06.2019 Removal Fixation Deep Kwire/Screw Lt; Hammertoe Repair 5th lt    DOS: 01/19/18 Procedure: Removal of Hardware L2nd MPJ, L 5th hammertoe repair  47 y.o. female returns for post-op check. Denies N/V/F/Ch. Pain is controlled with current medications.  States that the site where the pain is about is feeling much better.  Still reporting some pain at the fifth toe.  Objective:   General AA&O x3. Normal mood and affect.  Vascular Foot warm and well perfused.  Neurologic Gross sensation intact.  Dermatologic Skin healing well without signs of infection. Skin edges well coapted without signs of infection.  Orthopedic: Tenderness to palpation noted about the surgical sites.    Assessment & Plan:  Patient was evaluated and treated and all questions answered.  S/p removal of hardware left second MPJ correction left fifth hammertoe,  -Progressing as expected post-operatively. -Sutures: Removed today.  Steroids applied.  Okay to shower but not soak. -Medications refilled: none -Foot redressed.  Return in about 2 weeks (around 02/16/2018) for Post-op.

## 2018-02-15 ENCOUNTER — Encounter: Payer: Self-pay | Admitting: Family Medicine

## 2018-02-15 ENCOUNTER — Ambulatory Visit (INDEPENDENT_AMBULATORY_CARE_PROVIDER_SITE_OTHER): Payer: Medicaid Other | Admitting: Family Medicine

## 2018-02-15 ENCOUNTER — Telehealth: Payer: Self-pay | Admitting: Family Medicine

## 2018-02-15 VITALS — BP 136/94 | HR 84 | Temp 98.0°F | Resp 16 | Ht 65.0 in | Wt 190.6 lb

## 2018-02-15 DIAGNOSIS — N3281 Overactive bladder: Secondary | ICD-10-CM

## 2018-02-15 DIAGNOSIS — R32 Unspecified urinary incontinence: Secondary | ICD-10-CM | POA: Diagnosis not present

## 2018-02-15 LAB — POCT URINALYSIS DIP (DEVICE)
BILIRUBIN URINE: NEGATIVE
Glucose, UA: NEGATIVE mg/dL
HGB URINE DIPSTICK: NEGATIVE
Ketones, ur: NEGATIVE mg/dL
Leukocytes, UA: NEGATIVE
NITRITE: NEGATIVE
PH: 7 (ref 5.0–8.0)
PROTEIN: NEGATIVE mg/dL
Specific Gravity, Urine: 1.02 (ref 1.005–1.030)
UROBILINOGEN UA: 0.2 mg/dL (ref 0.0–1.0)

## 2018-02-15 MED ORDER — OXYBUTYNIN CHLORIDE 5 MG PO TABS: 5.0000 mg | ORAL_TABLET | Freq: Three times a day (TID) | ORAL | 1 refills | Status: DC

## 2018-02-15 NOTE — Telephone Encounter (Signed)
Please contact patient to advise Jury Duty letter is complete

## 2018-02-15 NOTE — Progress Notes (Signed)
Patient ID: Jasmin Stephenson, female    DOB: March 26, 1971, 47 y.o.   MRN: [id number]  PCP: Scot Jun, FNP  Chief Complaint  Patient presents with  . Urinary Frequency    urine incontenence and bladder sling status     Subjective:  HPI RHETT Stephenson is a 47 y.o. female presents for evaluation of urine leakage and frequency. Reports a history of bladder sling placement several years ago. Of recent she started experiencing worsening lower abdominal pressure and urine leakage requiring use of incontinence pads. Reports initially when sling was placed urinary incontinence improved. This problem has increasing worsened over the course of 1 year. She denies urinary odor, chills, CVA tenderness, hematuria, or abdominal pain. Social History   Socioeconomic History  . Marital status: Single    Spouse name: Not on file  . Number of children: Not on file  . Years of education: Not on file  . Highest education level: Not on file  Social Needs  . Financial resource strain: Not on file  . Food insecurity - worry: Not on file  . Food insecurity - inability: Not on file  . Transportation needs - medical: Not on file  . Transportation needs - non-medical: Not on file  Occupational History  . Not on file  Tobacco Use  . Smoking status: Current Every Day Smoker    Packs/day: 1.50    Years: 7.00    Pack years: 10.50    Types: Cigarettes  . Smokeless tobacco: Never Used  Substance and Sexual Activity  . Alcohol use: No  . Drug use: Yes    Types: Marijuana    Comment: 2-3 months ago  . Sexual activity: Yes    Birth control/protection: Surgical    Comment: last intercourse 27th Sep 2016  Other Topics Concern  . Not on file  Social History Narrative  . Not on file    Family History  Problem Relation Age of Onset  . Diabetes Father   . Breast cancer Sister 5  . Alcohol abuse Neg Hx   . Arthritis Neg Hx   . Asthma Neg Hx   . Birth defects Neg Hx   . Cancer Neg Hx   . COPD  Neg Hx   . Depression Neg Hx   . Drug abuse Neg Hx   . Early death Neg Hx   . Hearing loss Neg Hx   . Heart disease Neg Hx   . Hyperlipidemia Neg Hx   . Hypertension Neg Hx   . Kidney disease Neg Hx   . Learning disabilities Neg Hx   . Mental illness Neg Hx   . Mental retardation Neg Hx   . Miscarriages / Stillbirths Neg Hx   . Stroke Neg Hx   . Vision loss Neg Hx    Review of Systems Pertinent negatives indicated in HPI. Patient Active Problem List   Diagnosis Date Noted  . 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 Hair loss 01/16/2016  . Pain in joint, ankle and foot 01/16/2016      Prior to Admission medications   Medication Sig Start Date End Date Taking? Authorizing Provider  albuterol (PROVENTIL HFA;VENTOLIN HFA) 108 (90 Base) MCG/ACT inhaler Inhale 2 puffs into the lungs every 4 (four) hours as needed for wheezing or shortness of breath (cough, shortness of breath or wheezing.). 11/17/17  Yes Scot Jun, FNP  clindamycin (CLEOCIN) 150 MG capsule Take 1 capsule (150 mg total) by  mouth 2 (two) times daily. Patient not taking: Reported on 02/15/2018 01/19/18   Trula Slade, DPM  ondansetron (ZOFRAN) 4 MG tablet Take 1 tablet (4 mg total) by mouth every 8 (eight) hours as needed for nausea or vomiting. Patient not taking: Reported on 02/15/2018 01/19/18   Trula Slade, DPM  oxyCODONE-acetaminophen (PERCOCET) 10-325 MG tablet Take 1 tablet by mouth every 4 (four) hours as needed for pain. Patient not taking: Reported on 02/15/2018 01/26/18   Wallene Huh, DPM    Past Medical, Surgical Family and Social History reviewed and updated.    Objective:   Today's Vitals   02/15/18 1011  BP: (!) 136/94  Pulse: 84  Resp: 16  Temp: 98 F (36.7 C)  TempSrc: Oral  SpO2: 98%  Weight: 190 lb 9.6 oz (86.5 kg)  Height: 5\' 5"  (1.651 m)    Wt Readings from Last 3 Encounters:  02/15/18 190 lb 9.6 oz (86.5 kg)  11/17/17 180 lb 3.2 oz  (81.7 kg)  08/18/17 181 lb (82.1 kg)   Physical Exam  Constitutional: She appears well-developed and well-nourished.  Neck: Normal range of motion.  Cardiovascular: Normal rate, regular rhythm, normal heart sounds and intact distal pulses.  Pulmonary/Chest: Effort normal and breath sounds normal.  Abdominal: Soft. Bowel sounds are normal. She exhibits no distension. There is no tenderness.  Musculoskeletal: Normal range of motion.  Skin: Skin is warm and dry.  Psychiatric: She has a normal mood and affect. Her behavior is normal. Judgment and thought content normal.     Assessment & Plan:  1. Overactive bladder 2. Urinary incontinence, unspecified type Patient reports a history of a bladder sling placement several years past for overactive bladder and urine incontinence.  I will trial her on Ditropan 5 mg 3 times daily and refer her to [facility] urology for further evaluation and follow-up.   Meds ordered this encounter  Medications  . oxybutynin (DITROPAN) 5 MG tablet    Sig: Take 1 tablet (5 mg total) by mouth 3 (three) times daily.    Dispense:  90 tablet    Refill:  1    Order Specific Question:   Supervising Provider    Answer:   Tresa Garter [id number]    Orders Placed This Encounter  Procedures  . Ambulatory referral to Urology  . POCT urinalysis dip (device)    RTC: As needed.   Carroll Sage. Kenton Kingfisher, MSN, FNP-C [facility]  [address] [place], [place] [address] [phone]

## 2018-02-15 NOTE — Telephone Encounter (Signed)
Need letter requesting excuse from jury duty.

## 2018-02-15 NOTE — Patient Instructions (Signed)
Pick-up jury duty letter in AM    Overactive Bladder, Adult Overactive bladder is a group of urinary symptoms. With overactive bladder, you may suddenly feel the need to pass urine (urinate) right away. After feeling this sudden urge, you might also leak urine if you cannot get to the bathroom fast enough (urinary incontinence). These symptoms might interfere with your daily work or social activities. Overactive bladder symptoms may also wake you up at night. Overactive bladder affects the nerve signals between your bladder and your brain. Your bladder may get the signal to empty before it is full. Very sensitive muscles can also make your bladder squeeze too soon. What are the causes? Many things can cause an overactive bladder. Possible causes include:  Urinary tract infection.  Infection of nearby tissues, such as the prostate.  Prostate enlargement.  Being pregnant with twins or more (multiples).  Surgery on the uterus or urethra.  Bladder stones, inflammation, or tumors.  Drinking too much caffeine or alcohol.  Certain medicines, especially those that you take to help your body get rid of extra fluid (diuretics) by increasing urine production.  Muscle or nerve weakness, especially from: ? A spinal cord injury. ? Stroke. ? Multiple sclerosis. ? Parkinson disease.  Diabetes. This can cause a high urine volume that fills the bladder so quickly that the normal urge to urinate is triggered very strongly.  Constipation. A buildup of too much stool can put pressure on your bladder.  What increases the risk? You may be at greater risk for overactive bladder if you:  Are an older adult.  Smoke.  Are going through menopause.  Have prostate problems.  Have a neurological disease, such as stroke, dementia, Parkinson disease, or multiple sclerosis (MS).  Eat or drink things that irritate the bladder. These include alcohol, spicy food, and caffeine.  Are overweight or  obese.  What are the signs or symptoms? The signs and symptoms of an overactive bladder include:  Sudden, strong urges to urinate.  Leaking urine.  Urinating eight or more times per day.  Waking up to urinate two or more times per night.  How is this diagnosed? Your health care provider may suspect overactive bladder based on your symptoms. The health care provider will do a physical exam and take your medical history. Blood or urine tests may also be done. For example, you might need to have a bladder function test to check how well you can hold your urine. You might also need to see a health care provider who specializes in the urinary tract (urologist). How is this treated? Treatment for overactive bladder depends on the cause of your condition and whether it is mild or severe. Certain treatments can be done in your health care provider's office or clinic. You can also make lifestyle changes at home. Options include: Behavioral Treatments  Biofeedback. A specialist uses sensors to help you become aware of your body's signals.  Keeping a daily log of when you need to urinate and what happens after the urge. This may help you manage your condition.  Bladder training. This helps you learn to control the urge to urinate by following a schedule that directs you to urinate at regular intervals (timed voiding). At first, you might have to wait a few minutes after feeling the urge. In time, you should be able to schedule bathroom visits an hour or more apart.  Kegel exercises. These are exercises to strengthen the pelvic floor muscles, which support the bladder. Toning these  muscles can help you control urination, even if your bladder muscles are overactive. A specialist will teach you how to do these exercises correctly. They require daily practice.  Weight loss. If you are obese or overweight, losing weight might relieve your symptoms of overactive bladder. Talk to your health care provider  about losing weight and whether there is a specific program or method that would work best for you.  Diet change. This might help if constipation is making your overactive bladder worse. Your health care provider or a dietitian can explain ways to change what you eat to ease constipation. You might also need to consume less alcohol and caffeine or drink other fluids at different times of the day.  Stopping smoking.  Wearing pads to absorb leakage while you wait for other treatments to take effect. Physical Treatments  Electrical stimulation. Electrodes send gentle pulses of electricity to strengthen the nerves or muscles that help to control the bladder. Sometimes, the electrodes are placed outside of the body. In other cases, they might be placed inside the body (implanted). This treatment can take several months to have an effect.  Supportive devices. Women may need a plastic device that fits into the vagina and supports the bladder (pessary). Medicines Several medicines can help treat overactive bladder and are usually used along with other treatments. Some are injected into the muscles involved in urination. Others come in pill form. Your health care provider may prescribe:  Antispasmodics. These medicines block the signals that the nerves send to the bladder. This keeps the bladder from releasing urine at the wrong time.  Tricyclic antidepressants. These types of antidepressants also relax bladder muscles.  Surgery  You may have a device implanted to help manage the nerve signals that indicate when you need to urinate.  You may have surgery to implant electrodes for electrical stimulation.  Sometimes, very severe cases of overactive bladder require surgery to change the shape of the bladder. Follow these instructions at home:  Take medicines only as directed by your health care provider.  Use any implants or a pessary as directed by your health care provider.  Make any diet or  lifestyle changes that are recommended by your health care provider. These might include: ? Drinking less fluid or drinking at different times of the day. If you need to urinate often during the night, you may need to stop drinking fluids early in the evening. ? Cutting down on caffeine or alcohol. Both can make an overactive bladder worse. Caffeine is found in coffee, tea, and sodas. ? Doing Kegel exercises to strengthen muscles. ? Losing weight if you need to. ? Eating a healthy and balanced diet to prevent constipation.  Keep a journal or log to track how much and when you drink and also when you feel the need to urinate. This will help your health care provider to monitor your condition. Contact a health care provider if:  Your symptoms do not get better after treatment.  Your pain and discomfort are getting worse.  You have more frequent urges to urinate.  You have a fever. Get help right away if: You are not able to control your bladder at all. This information is not intended to replace advice given to you by your health care provider. Make sure you discuss any questions you have with your health care provider. Document Released: 09/26/2009 Document Revised: 05/07/2016 Document Reviewed: 04/25/2014 Elsevier Interactive Patient Education  [employer].

## 2018-02-16 ENCOUNTER — Ambulatory Visit (INDEPENDENT_AMBULATORY_CARE_PROVIDER_SITE_OTHER): Payer: Medicaid Other

## 2018-02-16 ENCOUNTER — Ambulatory Visit (INDEPENDENT_AMBULATORY_CARE_PROVIDER_SITE_OTHER): Payer: Medicaid Other | Admitting: Podiatry

## 2018-02-16 DIAGNOSIS — Z9889 Other specified postprocedural states: Secondary | ICD-10-CM

## 2018-02-16 DIAGNOSIS — M2042 Other hammer toe(s) (acquired), left foot: Secondary | ICD-10-CM | POA: Diagnosis not present

## 2018-02-16 DIAGNOSIS — M2041 Other hammer toe(s) (acquired), right foot: Secondary | ICD-10-CM | POA: Diagnosis not present

## 2018-02-16 DIAGNOSIS — M779 Enthesopathy, unspecified: Secondary | ICD-10-CM

## 2018-02-16 DIAGNOSIS — M79671 Pain in right foot: Secondary | ICD-10-CM

## 2018-02-16 NOTE — Telephone Encounter (Signed)
Patient picked up paperwork.

## 2018-02-17 ENCOUNTER — Other Ambulatory Visit: Payer: Self-pay | Admitting: Podiatry

## 2018-02-17 DIAGNOSIS — M2042 Other hammer toe(s) (acquired), left foot: Secondary | ICD-10-CM

## 2018-02-17 NOTE — Progress Notes (Signed)
  Subjective:  Patient ID: Jasmin Stephenson, female    DOB: August 12, 1971,  MRN: [id number]  Chief Complaint  Patient presents with  . Routine Post Op    DOS: 01/19/2018 Procedure: Removal of hardware left second MPJ, left fifth hammertoe repair  47 y.o. female returns for post-op check. Denies N/V/F/Ch. Doing well. Denies pain.  Has been wearing her cam boot.  Would like to also discuss right foot pain today.  Pain patient had surgery on her right foot and reports a painful corn to the inside aspect of the right fifth toe  Objective:   General AA&O x3. Normal mood and affect.  Vascular Foot warm and well perfused.  Neurologic Gross sensation intact.  Dermatologic Skin well-healed left foot. Right second MPJ callus plantarly Heloma molle right medial aspect of the fifth toe.  Orthopedic: No tenderness about the second MPJ left, fifth toe left. Pain to palpation about the right fifth digit, second MPJ   Assessment & Plan:  Patient was evaluated and treated and all questions answered.  S/p removal of hardware left second MPJ, correction left fifth hammertoe -X-rays taken reviewed.  Consistent with postop state. -Progressing as expected post-operatively. -Incisions well-healed -Transition out of cam boot into normal shoe gear.  Advised to start with 1 hour first day and titrate up until she can tolerate  Right second MPJ capsulitis, right fifth hammertoe -We will need x-rays at next visit -We will further discuss options.  Would likely benefit from hammertoe revision, possible Weil osteotomy second MPJ.  Return in about 4 weeks (around 03/16/2018) for post op.

## 2018-02-24 ENCOUNTER — Telehealth: Payer: Self-pay

## 2018-02-24 NOTE — Telephone Encounter (Signed)
Left a vm for patient to callback 

## 2018-03-10 ENCOUNTER — Ambulatory Visit (INDEPENDENT_AMBULATORY_CARE_PROVIDER_SITE_OTHER): Payer: Medicaid Other | Admitting: Podiatry

## 2018-03-10 ENCOUNTER — Ambulatory Visit: Payer: Self-pay

## 2018-03-10 ENCOUNTER — Ambulatory Visit (INDEPENDENT_AMBULATORY_CARE_PROVIDER_SITE_OTHER): Payer: Medicaid Other

## 2018-03-10 DIAGNOSIS — M2041 Other hammer toe(s) (acquired), right foot: Secondary | ICD-10-CM

## 2018-03-10 DIAGNOSIS — Z9889 Other specified postprocedural states: Secondary | ICD-10-CM

## 2018-03-10 DIAGNOSIS — M779 Enthesopathy, unspecified: Secondary | ICD-10-CM | POA: Diagnosis not present

## 2018-03-10 DIAGNOSIS — M21961 Unspecified acquired deformity of right lower leg: Secondary | ICD-10-CM

## 2018-03-10 NOTE — Progress Notes (Signed)
  Subjective:  Patient ID: Jasmin Stephenson, female    DOB: [birth year],  MRN: [id number]  Chief Complaint  Patient presents with  . Routine Post Op    left hammertoe repair  . Hammer Toe    right hammertoe -discuss surgery    DOS: 01/19/2018 Procedure: Removal of hardware left second MPJ, left fifth hammertoe repair  47 y.o. female returns for post-op check. Denies N/V/F/Ch.  Doing well on her left foot.  Denies pain.  Endorses occasional swelling  Still having pain in the right foot for which she would like to discuss possible surgical correction.  Reports painful callus under the ball of the second toe and a painful callus in between the fourth and fifth toes of the right foot.  Objective:   General AA&O x3. Normal mood and affect.  Vascular Foot warm and well perfused.  Neurologic Gross sensation intact.  Dermatologic Skin well-healed left foot. Right second MPJ callus plantarly Heloma molle right medial aspect of the fifth toe.  Orthopedic: No tenderness about the second MPJ left, fifth toe left. Pain to palpation about the right fifth digit, second MPJ   Assessment & Plan:  Patient was evaluated and treated and all questions answered.  S/p removal of hardware left second MPJ, correction left fifth hammertoe -Well-healed.  Denies pain.  X-rays without any interval changes  Right second MPJ capsulitis, right fifth hammertoe -X-rays taken reviewed.  Evidence of prior fifth metatarsal resection, fifth hammertoe correction. -Discussed with the patient performing shortening osteotomy the second metatarsal and an effort to alleviate the plantar callus.  No guarantees given that this would alleviate the callus.  Also discussed the benefit of performing revision of the fifth toe hammertoe.  Advised that an effort to avoid flail digit, would recommend syndactylization of the fourth fifth toes.  Patient verbalized understanding.  15 minutes of face to face time were spent with the  patient. >50% of this was spent on counseling and coordination of care. Specifically discussed with patient correction of the right foot issues as above.   Return for post op.

## 2018-03-10 NOTE — Patient Instructions (Signed)
Pre-Operative Instructions  Congratulations, you have decided to take an important step towards improving your quality of life.  You can be assured that the doctors and staff at [facility] will be with you every step of the way.  Here are some important things you should know:  1. Plan to be at the surgery center/hospital at least 1 (one) hour prior to your scheduled time, unless otherwise directed by the surgical center/hospital staff.  You must have a responsible adult accompany you, remain during the surgery and drive you home.  Make sure you have directions to the surgical center/hospital to ensure you arrive on time. 2. If you are having surgery at [facility] or [facility], you will need a copy of your medical history and physical form from your family physician within one month prior to the date of surgery. We will give you a form for your primary physician to complete.  3. We make every effort to accommodate the date you request for surgery.  However, there are times where surgery dates or times have to be moved.  We will contact you as soon as possible if a change in schedule is required.   4. No aspirin/ibuprofen for one week before surgery.  If you are on aspirin, any non-steroidal anti-inflammatory medications (Mobic, Aleve, Ibuprofen) should not be taken seven (7) days prior to your surgery.  You make take Tylenol for pain prior to surgery.  5. Medications - If you are taking daily heart and blood pressure medications, seizure, reflux, allergy, asthma, anxiety, pain or diabetes medications, make sure you notify the surgery center/hospital before the day of surgery so they can tell you which medications you should take or avoid the day of surgery. 6. No food or drink after midnight the night before surgery unless directed otherwise by surgical center/hospital staff. 7. No alcoholic beverages 24-hours prior to surgery.  No smoking 24-hours prior or 24-hours after  surgery. 8. Wear loose pants or shorts. They should be loose enough to fit over bandages, boots, and casts. 9. Don't wear slip-on shoes. Sneakers are preferred. 10. Bring your boot with you to the surgery center/hospital.  Also bring crutches or a walker if your physician has prescribed it for you.  If you do not have this equipment, it will be provided for you after surgery. 11. If you have not been contacted by the surgery center/hospital by the day before your surgery, call to confirm the date and time of your surgery. 12. Leave-time from work may vary depending on the type of surgery you have.  Appropriate arrangements should be made prior to surgery with your employer. 13. Prescriptions will be provided immediately following surgery by your doctor.  Fill these as soon as possible after surgery and take the medication as directed. Pain medications will not be refilled on weekends and must be approved by the doctor. 14. Remove nail polish on the operative foot and avoid getting pedicures prior to surgery. 15. Wash the night before surgery.  The night before surgery wash the foot and leg well with water and the antibacterial soap provided. Be sure to pay special attention to beneath the toenails and in between the toes.  Wash for at least three (3) minutes. Rinse thoroughly with water and dry well with a towel.  Perform this wash unless told not to do so by your physician.  Enclosed: 1 Ice pack (please put in freezer the night before surgery)   1 Hibiclens skin cleaner     Pre-op instructions  If you have any questions regarding the instructions, please do not hesitate to call our office.  [place]: [address], [place], [place] [address] -- [phone]  [place]: [address] , [place] [address] -- [phone]  [place]: [address]  [place], [place] [address] -- [phone]  [place]: [address], [address], [place], [place] [address] -- [phone]  Website:  [email or web address] 

## 2018-03-16 ENCOUNTER — Telehealth: Payer: Self-pay | Admitting: *Deleted

## 2018-03-16 ENCOUNTER — Other Ambulatory Visit: Payer: Self-pay | Admitting: Podiatry

## 2018-03-16 DIAGNOSIS — M21542 Acquired clubfoot, left foot: Secondary | ICD-10-CM | POA: Diagnosis not present

## 2018-03-16 DIAGNOSIS — M2040 Other hammer toe(s) (acquired), unspecified foot: Secondary | ICD-10-CM | POA: Diagnosis not present

## 2018-03-16 DIAGNOSIS — M25571 Pain in right ankle and joints of right foot: Secondary | ICD-10-CM | POA: Diagnosis not present

## 2018-03-16 DIAGNOSIS — J45909 Unspecified asthma, uncomplicated: Secondary | ICD-10-CM | POA: Diagnosis not present

## 2018-03-16 DIAGNOSIS — M205X1 Other deformities of toe(s) (acquired), right foot: Secondary | ICD-10-CM | POA: Diagnosis not present

## 2018-03-16 MED ORDER — ONDANSETRON HCL 4 MG PO TABS: 4.0000 mg | ORAL_TABLET | Freq: Three times a day (TID) | ORAL | 0 refills | Status: DC | PRN

## 2018-03-16 MED ORDER — OXYCODONE-ACETAMINOPHEN 10-325 MG PO TABS: 1.0000 | ORAL_TABLET | ORAL | 0 refills | Status: DC | PRN

## 2018-03-16 MED ORDER — CLINDAMYCIN HCL 150 MG PO CAPS: 150.0000 mg | ORAL_CAPSULE | Freq: Two times a day (BID) | ORAL | 0 refills | Status: DC

## 2018-03-16 NOTE — Telephone Encounter (Signed)
I told pt that Medicaid requires a PA and it would take 7-10 days for the prior authorization to return. I told pt she could cash pay for the medication, and I would contact the [phone] and call again.

## 2018-03-16 NOTE — Telephone Encounter (Signed)
Faxed [facility] for Prior Approval for Short-acting Opioid Analgesic to [place] [facility].

## 2018-03-16 NOTE — Telephone Encounter (Signed)
Nevin Bloodgood - [facility] states pt could do the cash pay of $49.69. I informed to the pt and she states it only took 2 days last time, and she would wait. I told pt I would complete the form and fax today.

## 2018-03-16 NOTE — Telephone Encounter (Signed)
Pt states the pharmacy says her pain medication needs prior authorization.

## 2018-03-17 NOTE — Telephone Encounter (Signed)
Ailene Ravel - [facility] states the oxycodone was picked up today and the insurance paid.

## 2018-03-18 ENCOUNTER — Ambulatory Visit (INDEPENDENT_AMBULATORY_CARE_PROVIDER_SITE_OTHER): Payer: Medicaid Other | Admitting: Podiatry

## 2018-03-18 ENCOUNTER — Ambulatory Visit (INDEPENDENT_AMBULATORY_CARE_PROVIDER_SITE_OTHER): Payer: Medicaid Other

## 2018-03-18 DIAGNOSIS — Z9889 Other specified postprocedural states: Secondary | ICD-10-CM

## 2018-03-18 DIAGNOSIS — M21961 Unspecified acquired deformity of right lower leg: Secondary | ICD-10-CM

## 2018-03-18 DIAGNOSIS — M2041 Other hammer toe(s) (acquired), right foot: Secondary | ICD-10-CM

## 2018-03-18 MED ORDER — OXYCODONE-ACETAMINOPHEN 10-325 MG PO TABS: 1.0000 | ORAL_TABLET | ORAL | 0 refills | Status: DC | PRN

## 2018-03-25 ENCOUNTER — Encounter (INDEPENDENT_AMBULATORY_CARE_PROVIDER_SITE_OTHER): Payer: Medicaid Other

## 2018-03-31 ENCOUNTER — Ambulatory Visit (INDEPENDENT_AMBULATORY_CARE_PROVIDER_SITE_OTHER): Payer: Medicaid Other | Admitting: Podiatry

## 2018-03-31 DIAGNOSIS — M2041 Other hammer toe(s) (acquired), right foot: Secondary | ICD-10-CM

## 2018-03-31 DIAGNOSIS — M21961 Unspecified acquired deformity of right lower leg: Secondary | ICD-10-CM

## 2018-04-04 NOTE — Progress Notes (Signed)
This encounter was created in error - please disregard.  This encounter was created in error - please disregard.

## 2018-04-07 NOTE — Progress Notes (Signed)
  Subjective:  Patient ID: Jasmin Stephenson, female    DOB: 1971-12-05,  MRN: [id number]  Chief Complaint  Patient presents with  . Routine Post Op    dos 04.03.2019 Metatarsal Osteotomy 2nd Rt; Hammertoe Repair 5th Rt; Webbing Procedure 4,5 Rt     DOS: 03/16/18  Procedure: R 2nd Weil Osteotomy, R 4th/5th Syndacytlization  47 y.o. female returns for post-op check. Denies N/V/F/Ch. Pain is controlled with current medications.  Objective:   General AA&O x3. Normal mood and affect.  Vascular Foot warm and well perfused.  Neurologic Gross sensation intact.  Dermatologic Skin healing well without signs of infection. Skin edges well coapted without signs of infection.  Orthopedic: Tenderness to palpation noted about the surgical site.    Assessment & Plan:  Patient was evaluated and treated and all questions answered.  S/p R 2nd Weil Osteotomy, R 4th/5th Syndacytlization -Progressing as expected post-operatively. -Sutures: removed 2nd metatarsal. Steris applied. -Medications refilled: none -Continue betadine to 4th/5th toes. -Foot redressed.  Return in about 2 weeks (around 04/14/2018) for price patient.

## 2018-04-10 NOTE — Progress Notes (Signed)
  Subjective:  Patient ID: Jasmin Stephenson, female    DOB: 11-07-1971,  MRN: [id number]  Chief Complaint  Patient presents with  . Routine Post Op    POV#1 - DOS 03-16-18  Metatarsal osteotomy 2nd met, hammer toe repair 5th toe and webbing procedure 4th and 5th toes right foot        DOS: 03/16/18 Procedure: R 2nd Weil Osteotomy, Hammertoe 5th revision with webbing  47 y.o. female returns for post-op check. Denies N/V/F/Ch. Pain is controlled with current medications.  Objective:   General AA&O x3. Normal mood and affect.  Vascular Foot warm and well perfused.  Neurologic Gross sensation intact.  Dermatologic Skin healing well without signs of infection. Skin edges well coapted without signs of infection.  Orthopedic: Tenderness to palpation noted about the surgical site.    Assessment & Plan:  Patient was evaluated and treated and all questions answered.  S/p R 2nd Weil Osteotomy, Hammertoe 5th revision with webbing -Progressing as expected post-operatively. -Sutures: intact. -XR taken and c/w post op state. -Medications refilled: Percocet -Foot redressed.  Return in about 1 week (around 03/25/2018) for Wound Care.

## 2018-04-14 ENCOUNTER — Encounter: Payer: Medicaid Other | Admitting: Podiatry

## 2018-04-21 ENCOUNTER — Encounter: Payer: Self-pay | Admitting: Podiatry

## 2018-04-21 ENCOUNTER — Ambulatory Visit (INDEPENDENT_AMBULATORY_CARE_PROVIDER_SITE_OTHER): Payer: Medicaid Other

## 2018-04-21 ENCOUNTER — Ambulatory Visit (INDEPENDENT_AMBULATORY_CARE_PROVIDER_SITE_OTHER): Payer: Medicaid Other | Admitting: Podiatry

## 2018-04-21 DIAGNOSIS — Z9889 Other specified postprocedural states: Secondary | ICD-10-CM

## 2018-04-21 DIAGNOSIS — M21961 Unspecified acquired deformity of right lower leg: Secondary | ICD-10-CM

## 2018-04-21 DIAGNOSIS — M205X1 Other deformities of toe(s) (acquired), right foot: Secondary | ICD-10-CM | POA: Diagnosis not present

## 2018-05-19 ENCOUNTER — Encounter: Payer: Medicaid Other | Admitting: Podiatry

## 2018-06-02 ENCOUNTER — Ambulatory Visit (INDEPENDENT_AMBULATORY_CARE_PROVIDER_SITE_OTHER): Payer: Medicaid Other | Admitting: Podiatry

## 2018-06-02 DIAGNOSIS — M21961 Unspecified acquired deformity of right lower leg: Secondary | ICD-10-CM

## 2018-06-02 NOTE — Progress Notes (Signed)
  Subjective:  Patient ID: Jasmin Stephenson, female    DOB: 1971-09-11,  MRN: [id number]  Chief Complaint  Patient presents with  . Routine Post Op     POV 04.03.2019 Metatarsal Osteotomy 2nd Rt; Hammertoe Repair 5th Rt; Webbing Procedure 4,5 4WK F/U RT FOOT    DOS: 03/16/18  Procedure: R 2nd Weil Osteotomy, R 4th/5th Syndacytlization  47 y.o. female returns for post-op check. Doing well no pain. Pleased with results of surgeries.  Objective:   General AA&O x3. Normal mood and affect.  Vascular Foot warm and well perfused.  Neurologic Gross sensation intact.  Dermatologic Skin well healed.  Orthopedic: No tenderness to palpation noted about the surgical site.    Assessment & Plan:  Patient was evaluated and treated and all questions answered.  S/p R 2nd Weil Osteotomy, R 4th/5th Syndacytlization -Progressing as expected post-operatively. -Wounds healed doing well no pain. -Recommend urea callus cream for calluses.  Return if symptoms worsen or fail to improve.

## 2018-06-15 DIAGNOSIS — F331 Major depressive disorder, recurrent, moderate: Secondary | ICD-10-CM | POA: Diagnosis not present

## 2018-06-18 DIAGNOSIS — F331 Major depressive disorder, recurrent, moderate: Secondary | ICD-10-CM | POA: Diagnosis not present

## 2018-06-29 ENCOUNTER — Other Ambulatory Visit: Payer: Self-pay | Admitting: Podiatry

## 2018-06-29 DIAGNOSIS — M21961 Unspecified acquired deformity of right lower leg: Secondary | ICD-10-CM

## 2018-07-06 DIAGNOSIS — F331 Major depressive disorder, recurrent, moderate: Secondary | ICD-10-CM | POA: Diagnosis not present

## 2018-07-09 DIAGNOSIS — F331 Major depressive disorder, recurrent, moderate: Secondary | ICD-10-CM | POA: Diagnosis not present

## 2018-07-20 ENCOUNTER — Telehealth: Payer: Self-pay

## 2018-07-20 DIAGNOSIS — F331 Major depressive disorder, recurrent, moderate: Secondary | ICD-10-CM | POA: Diagnosis not present

## 2018-07-20 NOTE — Telephone Encounter (Signed)
Patient was seen by Jasmin Stephenson in March and states that a referral was suppose to be placed to a neurologist for her neck. I told patient I would send a message to see if you could look at her note and send a referral or does she need a office visit.

## 2018-07-22 DIAGNOSIS — F331 Major depressive disorder, recurrent, moderate: Secondary | ICD-10-CM | POA: Diagnosis not present

## 2018-07-24 NOTE — Telephone Encounter (Signed)
Jasmin Stephenson,   Please let patient know that I don't see a referral for Neurology. We will discuss at next office visit. Please let her know that if pain increases, she can definitely reschedule to see me earlier.   Thanks.

## 2018-07-25 NOTE — Telephone Encounter (Signed)
Left a vm for patient to callback and schedule appointment 

## 2018-08-02 DIAGNOSIS — F331 Major depressive disorder, recurrent, moderate: Secondary | ICD-10-CM | POA: Diagnosis not present

## 2018-08-05 DIAGNOSIS — F331 Major depressive disorder, recurrent, moderate: Secondary | ICD-10-CM | POA: Diagnosis not present

## 2018-08-10 DIAGNOSIS — F331 Major depressive disorder, recurrent, moderate: Secondary | ICD-10-CM | POA: Diagnosis not present

## 2018-08-17 DIAGNOSIS — F331 Major depressive disorder, recurrent, moderate: Secondary | ICD-10-CM | POA: Diagnosis not present

## 2018-08-23 ENCOUNTER — Ambulatory Visit: Payer: Medicaid Other | Admitting: Family Medicine

## 2018-08-24 NOTE — Progress Notes (Signed)
  Subjective:  Patient ID: Jasmin Stephenson, female    DOB: December 31, 1970,  MRN: [id number]  Chief Complaint  Patient presents with  . Routine Post Op    dos 04.03.2019 Metatarsal Osteotomy 2nd Rt; Hammertoe Repair 5th Rt; Webbing Procedure 4,5 Rt - denies having much pain, mostly numbness in foot     DOS: 03/16/18  Procedure: R 2nd Weil Osteotomy, R 4th/5th Syndacytlization  47 y.o. female returns for post-op check. Denies N/V/F/Ch.  Denies pain states that she is most having numbness  Objective:   General AA&O x3. Normal mood and affect.  Vascular Foot warm and well perfused.  Neurologic Gross sensation intact.  Dermatologic  and well-healed  Orthopedic: Tenderness to palpation noted about the surgical site.    Assessment & Plan:  Patient was evaluated and treated and all questions answered.  S/p R 2nd Weil Osteotomy, R 4th/5th Syndacytlization -Healing well.  Betadine applied digitally.  Transition to surgical shoe. Return in about 1 month (around 05/19/2018) for Post-op.

## 2018-08-30 ENCOUNTER — Ambulatory Visit (INDEPENDENT_AMBULATORY_CARE_PROVIDER_SITE_OTHER): Payer: Medicaid Other | Admitting: Family Medicine

## 2018-08-30 ENCOUNTER — Encounter: Payer: Self-pay | Admitting: Family Medicine

## 2018-08-30 ENCOUNTER — Telehealth: Payer: Self-pay

## 2018-08-30 VITALS — BP 136/94 | HR 88 | Temp 98.7°F | Ht 65.0 in | Wt 210.0 lb

## 2018-08-30 DIAGNOSIS — N3281 Overactive bladder: Secondary | ICD-10-CM

## 2018-08-30 DIAGNOSIS — L659 Nonscarring hair loss, unspecified: Secondary | ICD-10-CM | POA: Diagnosis not present

## 2018-08-30 DIAGNOSIS — Z131 Encounter for screening for diabetes mellitus: Secondary | ICD-10-CM | POA: Diagnosis not present

## 2018-08-30 DIAGNOSIS — G8929 Other chronic pain: Secondary | ICD-10-CM

## 2018-08-30 DIAGNOSIS — Z09 Encounter for follow-up examination after completed treatment for conditions other than malignant neoplasm: Secondary | ICD-10-CM | POA: Diagnosis not present

## 2018-08-30 DIAGNOSIS — M542 Cervicalgia: Secondary | ICD-10-CM

## 2018-08-30 DIAGNOSIS — Z23 Encounter for immunization: Secondary | ICD-10-CM | POA: Diagnosis not present

## 2018-08-30 LAB — POCT URINALYSIS DIP (MANUAL ENTRY)
Bilirubin, UA: NEGATIVE
Blood, UA: NEGATIVE
Glucose, UA: NEGATIVE mg/dL
Ketones, POC UA: NEGATIVE mg/dL
Leukocytes, UA: NEGATIVE
Nitrite, UA: NEGATIVE
Protein Ur, POC: NEGATIVE mg/dL
Spec Grav, UA: 1.01 (ref 1.010–1.025)
Urobilinogen, UA: 0.2 E.U./dL
pH, UA: 6.5 (ref 5.0–8.0)

## 2018-08-30 LAB — POCT GLYCOSYLATED HEMOGLOBIN (HGB A1C): Hemoglobin A1C: 5.7 % — AB (ref 4.0–5.6)

## 2018-08-30 NOTE — Telephone Encounter (Signed)
Left a vm for patient to callback regarding appointment that has been scheduled with [facility].

## 2018-08-30 NOTE — Progress Notes (Signed)
Follow Up   Subjective:    Patient ID: Jasmin Stephenson, female    DOB: 1971-09-23, 47 y.o.   MRN: [id number]   Chief Complaint  Patient presents with  . Follow-up    chronic condition  . Referral    Dermatology and [facility]    HPI  Jasmin Stephenson is a 47 year old female with a past medical history of MI, GERD, Depression, Cervical Herniated Disc, Asthma, Arthritis, and Anxiety. She is here for follow up today.   Current Status: Since her last office visit, she continues to have chronic neck pain and would like to be referred to Neurology, which she is not taking any medication for pain. She is currently not taking any medication for her overactive bladder. She denies dysuria and hematuria. She has moderate anxiety concerning hair loss.   She denies fevers, chills, fatigue, recent infections, weight loss, and night sweats. She has not had any headaches, visual changes, dizziness, and falls. No chest pain, heart palpitations, cough and shortness of breath reported. No reports of GI problems such as nausea, vomiting, diarrhea, and constipation. She has no reports of blood in stools. She denies pain today.   Past Medical History:  Diagnosis Date  . Anxiety   . Arthritis    and tendonitis  . Asthma   . Breast calcification, right   . Cervical herniated disc   . Depression   . GERD (gastroesophageal reflux disease)   . Myocardial infarction ([place])    " Mild"    Family History  Problem Relation Age of Onset  . Diabetes Father   . Breast cancer Sister 12  . Alcohol abuse Neg Hx   . Arthritis Neg Hx   . Asthma Neg Hx   . Birth defects Neg Hx   . Cancer Neg Hx   . COPD Neg Hx   . Depression Neg Hx   . Drug abuse Neg Hx   . Early death Neg Hx   . Hearing loss Neg Hx   . Heart disease Neg Hx   . Hyperlipidemia Neg Hx   . Hypertension Neg Hx   . Kidney disease Neg Hx   . Learning disabilities Neg Hx   . Mental illness Neg Hx   . Mental retardation Neg Hx   .  Miscarriages / Stillbirths Neg Hx   . Stroke Neg Hx   . Vision loss Neg Hx     Social History   Socioeconomic History  . Marital status: Single    Spouse name: Not on file  . Number of children: Not on file  . Years of education: Not on file  . Highest education level: Not on file  Occupational History  . Not on file  Social Needs  . Financial resource strain: Not on file  . Food insecurity:    Worry: Not on file    Inability: Not on file  . Transportation needs:    Medical: Not on file    Non-medical: Not on file  Tobacco Use  . Smoking status: Current Every Day Smoker    Packs/day: 1.50    Years: 7.00    Pack years: 10.50    Types: Cigarettes  . Smokeless tobacco: Never Used  Substance and Sexual Activity  . Alcohol use: No  . Drug use: Yes    Types: Marijuana    Comment: 2-3 months ago  . Sexual activity: Yes    Birth control/protection: Surgical    Comment: last intercourse  27th Sep 2016  Lifestyle  . Physical activity:    Days per week: Not on file    Minutes per session: Not on file  . Stress: Not on file  Relationships  . Social connections:    Talks on phone: Not on file    Gets together: Not on file    Attends religious service: Not on file    Active member of club or organization: Not on file    Attends meetings of clubs or organizations: Not on file    Relationship status: Not on file  . Intimate partner violence:    Fear of current or ex partner: Not on file    Emotionally abused: Not on file    Physically abused: Not on file    Forced sexual activity: Not on file  Other Topics Concern  . Not on file  Social History Narrative  . Not on file   Past Surgical History:  Procedure Laterality Date  . ABDOMINAL SURGERY    . APPENDECTOMY    . BLADDER SURGERY    . CERVICAL DISC ARTHROPLASTY N/A 09/21/2016   Procedure: CERVICAL SIX- CERVICAL SEVEN ARTHROPLASTY;  Surgeon: Kevan Ny Ditty, MD;  Location: [facility];  Service: Neurosurgery;   Laterality: N/A;  C6-7 Arthroplasty  . IRRIGATION AND DEBRIDEMENT ABSCESS Right 09/10/2014   Procedure: IRRIGATION AND DEBRIDEMENT RIGHT BREAST  ABSCESS;  Surgeon: Gayland Curry, MD;  Location: [facility];  Service: General;  Laterality: Right;  . WISDOM TOOTH EXTRACTION      Immunization History  Administered Date(s) Administered  . Influenza,inj,Quad PF,6+ Mos 09/12/2014, 08/18/2017, 08/30/2018  . Pneumococcal Polysaccharide-23 09/12/2014  . Tdap 08/18/2017   Current Meds  Medication Sig  . albuterol (PROVENTIL HFA;VENTOLIN HFA) 108 (90 Base) MCG/ACT inhaler Inhale 2 puffs into the lungs every 4 (four) hours as needed for wheezing or shortness of breath (cough, shortness of breath or wheezing.).   Allergies  Allergen Reactions  . Penicillins Nausea And Vomiting and Rash    Has patient had a PCN reaction causing immediate rash, facial/tongue/throat swelling, SOB or lightheadedness with hypotension: Yes Has patient had a PCN reaction causing severe rash involving mucus membranes or skin necrosis: Yes Has patient had a PCN reaction that required hospitalization No Has patient had a PCN reaction occurring within the last 10 years: No If all of the above answers are "NO", then may proceed with Cephalosporin use.    BP (!) 136/94 (BP Location: Left Arm, Patient Position: Sitting, Cuff Size: Large)   Pulse 88   Temp 98.7 F (37.1 C) (Oral)   Ht 5\' 5"  (1.651 m)   Wt 210 lb (95.3 kg)   LMP 08/06/2018   SpO2 96%   BMI 34.95 kg/m   Review of Systems  Constitutional: Negative.   HENT: Negative.   Respiratory: Negative.   Cardiovascular: Negative.   Gastrointestinal: Positive for abdominal distention (obese).  Genitourinary: Negative.   Musculoskeletal: Positive for neck pain (Chronic).  Skin: Negative.   Psychiatric/Behavioral: The patient is nervous/anxious.    Objective:   Physical Exam  Constitutional: She appears well-developed and well-nourished.  Cardiovascular: Normal rate,  regular rhythm, normal heart sounds and intact distal pulses.  Pulmonary/Chest: Effort normal and breath sounds normal.  Abdominal: Soft. Bowel sounds are normal.  Musculoskeletal: Normal range of motion.  Limited ROM in neck  Neurological: She is alert.  Skin: Skin is warm and dry.  Psychiatric: She has a normal mood and affect. Her behavior is normal. Judgment and  thought content normal.  Nursing note and vitals reviewed.  Assessment & Plan:   1. Chronic neck pain We will refer her to Neurology.   2. Overactive bladder Stable. We will refer her to [facility] for follow up.   3. Alopecia We will refer her to Dermatology for assessment of hair loss.   4. Screening for diabetes mellitus Hgb A1c is at 5.7 today. She will continue to decrease foods/beverages high in sugars and carbs and follow Heart Healthy or DASH diet. Increase physical activity to at least 30 minutes cardio exercise daily.  - POCT urinalysis dipstick - POCT glycosylated hemoglobin (Hb A1C)  5. Need for immunization against influenza - Flu Vaccine QUAD 36+ mos IM  6. Follow up She will follow up in 3 months.  No orders of the defined types were placed in this encounter.   Kathe Becton,  MSN, FNP-C [facility] [address] [place], [place] [address] [phone]

## 2018-09-01 NOTE — Telephone Encounter (Signed)
Tried to contact patient again regarding her appointments that have been scheduled for Urology and Neurology but no answer and a vm has been left.

## 2018-09-02 ENCOUNTER — Telehealth: Payer: Self-pay | Admitting: Family Medicine

## 2018-09-02 DIAGNOSIS — M542 Cervicalgia: Secondary | ICD-10-CM | POA: Insufficient documentation

## 2018-09-02 NOTE — Telephone Encounter (Signed)
Message left on patient's voicemail reminding her of scheduled upcoming appointments with [facility] 09/24/2018, and [facility] on 09/05/2018.

## 2018-09-02 NOTE — Telephone Encounter (Signed)
Patient notified of appointments.

## 2018-09-13 ENCOUNTER — Other Ambulatory Visit: Payer: Self-pay | Admitting: Family Medicine

## 2018-09-13 ENCOUNTER — Telehealth: Payer: Self-pay | Admitting: Family Medicine

## 2018-09-13 NOTE — Telephone Encounter (Signed)
Spoke to patient on phone concerning neck pain. She is managing her pain well. She currently taking Acetaminophen for relief, and denies any additional medication for pain relief. She received several referrals last month and is scheduled with Neurology at [facility] on 10/9 and with [facility] on 10/21. She states that she will return to her previous Dermatologist for Alopecia. She will keep her follow up appointment with our office on 11/29/2018 and to follow up sooner if symptoms worsen. Patient verbalized understanding.

## 2018-09-15 DIAGNOSIS — F331 Major depressive disorder, recurrent, moderate: Secondary | ICD-10-CM | POA: Diagnosis not present

## 2018-09-21 DIAGNOSIS — R202 Paresthesia of skin: Secondary | ICD-10-CM | POA: Diagnosis not present

## 2018-09-21 DIAGNOSIS — R200 Anesthesia of skin: Secondary | ICD-10-CM | POA: Insufficient documentation

## 2018-09-21 DIAGNOSIS — M5412 Radiculopathy, cervical region: Secondary | ICD-10-CM | POA: Diagnosis not present

## 2018-09-21 DIAGNOSIS — Z981 Arthrodesis status: Secondary | ICD-10-CM | POA: Diagnosis not present

## 2018-09-21 DIAGNOSIS — M542 Cervicalgia: Secondary | ICD-10-CM | POA: Diagnosis not present

## 2018-09-24 DIAGNOSIS — F331 Major depressive disorder, recurrent, moderate: Secondary | ICD-10-CM | POA: Diagnosis not present

## 2018-09-27 DIAGNOSIS — F331 Major depressive disorder, recurrent, moderate: Secondary | ICD-10-CM | POA: Diagnosis not present

## 2018-09-29 DIAGNOSIS — F331 Major depressive disorder, recurrent, moderate: Secondary | ICD-10-CM | POA: Diagnosis not present

## 2018-10-03 DIAGNOSIS — R350 Frequency of micturition: Secondary | ICD-10-CM | POA: Diagnosis not present

## 2018-10-03 DIAGNOSIS — N3946 Mixed incontinence: Secondary | ICD-10-CM | POA: Diagnosis not present

## 2018-10-05 DIAGNOSIS — F331 Major depressive disorder, recurrent, moderate: Secondary | ICD-10-CM | POA: Diagnosis not present

## 2018-10-28 ENCOUNTER — Other Ambulatory Visit: Payer: Self-pay | Admitting: Surgery

## 2018-10-28 DIAGNOSIS — K219 Gastro-esophageal reflux disease without esophagitis: Secondary | ICD-10-CM | POA: Diagnosis not present

## 2018-10-28 DIAGNOSIS — R1011 Right upper quadrant pain: Secondary | ICD-10-CM | POA: Diagnosis not present

## 2018-10-28 DIAGNOSIS — R1013 Epigastric pain: Secondary | ICD-10-CM

## 2018-11-01 DIAGNOSIS — L659 Nonscarring hair loss, unspecified: Secondary | ICD-10-CM | POA: Diagnosis not present

## 2018-11-08 DIAGNOSIS — F331 Major depressive disorder, recurrent, moderate: Secondary | ICD-10-CM | POA: Diagnosis not present

## 2018-11-09 ENCOUNTER — Inpatient Hospital Stay: Admission: RE | Admit: 2018-11-09 | Payer: Medicaid Other | Source: Ambulatory Visit

## 2018-11-09 ENCOUNTER — Other Ambulatory Visit: Payer: Medicaid Other

## 2018-11-14 ENCOUNTER — Ambulatory Visit
Admission: RE | Admit: 2018-11-14 | Discharge: 2018-11-14 | Disposition: A | Discharge status: Home / Self Care | Payer: Medicaid Other | Source: Ambulatory Visit | Attending: Surgery | Admitting: Surgery

## 2018-11-14 DIAGNOSIS — R1013 Epigastric pain: Secondary | ICD-10-CM

## 2018-11-14 DIAGNOSIS — K449 Diaphragmatic hernia without obstruction or gangrene: Secondary | ICD-10-CM | POA: Diagnosis not present

## 2018-11-14 DIAGNOSIS — R109 Unspecified abdominal pain: Secondary | ICD-10-CM | POA: Diagnosis not present

## 2018-11-15 NOTE — Progress Notes (Signed)
Please call the patient and let them know that their tests showed no gallstones and that her hiatal hernia repair looks good.  I would like to order a HIDA scan to make sure that her gallbladder isn't causing her symptoms.  I need to have this done before she comes back in to see me.  Please go ahead and order this for RUQ pain and get it scheduled  Thanks

## 2018-11-17 ENCOUNTER — Other Ambulatory Visit (HOSPITAL_COMMUNITY): Payer: Self-pay | Admitting: Surgery

## 2018-11-17 DIAGNOSIS — R1011 Right upper quadrant pain: Secondary | ICD-10-CM

## 2018-11-19 DIAGNOSIS — F331 Major depressive disorder, recurrent, moderate: Secondary | ICD-10-CM | POA: Diagnosis not present

## 2018-11-23 DIAGNOSIS — F331 Major depressive disorder, recurrent, moderate: Secondary | ICD-10-CM | POA: Diagnosis not present

## 2018-11-24 DIAGNOSIS — N3946 Mixed incontinence: Secondary | ICD-10-CM | POA: Diagnosis not present

## 2018-11-24 DIAGNOSIS — R350 Frequency of micturition: Secondary | ICD-10-CM | POA: Diagnosis not present

## 2018-11-25 ENCOUNTER — Ambulatory Visit (HOSPITAL_COMMUNITY)
Admission: RE | Admit: 2018-11-25 | Discharge: 2018-11-25 | Disposition: A | Discharge status: Home / Self Care | Payer: Medicaid Other | Source: Ambulatory Visit | Attending: Surgery | Admitting: Surgery

## 2018-11-25 DIAGNOSIS — R1011 Right upper quadrant pain: Secondary | ICD-10-CM | POA: Insufficient documentation

## 2018-11-25 MED ORDER — TECHNETIUM TC 99M MEBROFENIN IV KIT
5.0000 | PACK | Freq: Once | INTRAVENOUS | Status: AC | PRN
  Administered 2018-11-25: 5 via INTRAVENOUS

## 2018-11-29 ENCOUNTER — Ambulatory Visit: Payer: Medicaid Other | Admitting: Family Medicine

## 2018-12-13 ENCOUNTER — Encounter: Payer: Self-pay | Admitting: Family Medicine

## 2018-12-13 ENCOUNTER — Ambulatory Visit (INDEPENDENT_AMBULATORY_CARE_PROVIDER_SITE_OTHER): Payer: Medicaid Other | Admitting: Family Medicine

## 2018-12-13 VITALS — BP 136/90 | HR 92 | Temp 98.0°F | Ht 65.0 in | Wt 216.0 lb

## 2018-12-13 DIAGNOSIS — L659 Nonscarring hair loss, unspecified: Secondary | ICD-10-CM

## 2018-12-13 DIAGNOSIS — G8929 Other chronic pain: Secondary | ICD-10-CM

## 2018-12-13 DIAGNOSIS — B354 Tinea corporis: Secondary | ICD-10-CM

## 2018-12-13 DIAGNOSIS — M542 Cervicalgia: Secondary | ICD-10-CM | POA: Diagnosis not present

## 2018-12-13 DIAGNOSIS — M25552 Pain in left hip: Secondary | ICD-10-CM

## 2018-12-13 DIAGNOSIS — Z09 Encounter for follow-up examination after completed treatment for conditions other than malignant neoplasm: Secondary | ICD-10-CM | POA: Diagnosis not present

## 2018-12-13 DIAGNOSIS — N3281 Overactive bladder: Secondary | ICD-10-CM | POA: Diagnosis not present

## 2018-12-13 MED ORDER — TERBINAFINE HCL 1 % EX CREA: 1.0000 "application " | TOPICAL_CREAM | Freq: Two times a day (BID) | CUTANEOUS | 1 refills | Status: DC

## 2018-12-13 NOTE — Patient Instructions (Signed)
Body Ringworm Body ringworm is an infection of the skin that often causes a ring-shaped rash. Body ringworm can affect any part of your skin. It can spread easily to others. Body ringworm is also called tinea corporis. What are the causes? This condition is caused by funguses called dermatophytes. The condition develops when these funguses grow out of control on the skin. You can get this condition if you touch a person or animal that has it. You can also get it if you share clothing, bedding, towels, or any other object with an infected person or pet. What increases the risk? This condition is more likely to develop in:  Athletes who often make skin-to-skin contact with other athletes, such as wrestlers.  People who share equipment and mats.  People with a weakened immune system. What are the signs or symptoms? Symptoms of this condition include:  Itchy, raised red spots and bumps.  Red scaly patches.  A ring-shaped rash. The rash may have: ? A clear center. ? Scales or red bumps at its center. ? Redness near its borders. ? Dry and scaly skin on or around it. How is this diagnosed? This condition can usually be diagnosed with a skin exam. A skin scraping may be taken from the affected area and examined under a microscope to see if the fungus is present. How is this treated? This condition may be treated with:  An antifungal cream or ointment.  An antifungal shampoo.  Antifungal medicines. These may be prescribed if your ringworm is severe, keeps coming back, or lasts a long time. Follow these instructions at home:  Take over-the-counter and prescription medicines only as told by your health care provider.  If you were given an antifungal cream or ointment: ? Use it as told by your health care provider. ? Wash the infected area and dry it completely before applying the cream or ointment.  If you were given an antifungal shampoo: ? Use it as told by your health care  provider. ? Leave the shampoo on your body for 3-5 minutes before rinsing.  While you have a rash: ? Wear loose clothing to stop clothes from rubbing and irritating it. ? Wash or change your bed sheets every night.  If your pet has the same infection, take your pet to see a Animal nutritionist. How is this prevented?  Practice good hygiene.  Wear sandals or shoes in public places and showers.  Do not share personal items with others.  Avoid touching red patches of skin on other people.  Avoid touching pets that have bald spots.  If you touch an animal that has a bald spot, wash your hands. Contact a health care provider if:  Your rash continues to spread after 7 days of treatment.  Your rash is not gone in 4 weeks.  The area around your rash gets red, warm, tender, and swollen. This information is not intended to replace advice given to you by your health care provider. Make sure you discuss any questions you have with your health care provider. Document Released: 11/27/2000 Document Revised: 05/07/2016 Document Reviewed: 09/26/2015 Elsevier Interactive Patient Education  2019 [place]. Terbinafine skin cream, gel, or topical solution What is this medicine? TERBINAFINE (TER bin a feen) is an antifungal medicine. It is used to treat certain kinds of fungal or yeast infections of the skin. This medicine may be used for other purposes; ask your health care provider or pharmacist if you have questions. COMMON BRAND NAME(S): Desenex Max, Lamisil AT,  Lamisil AT Athletes Foot, Lamisil AT [facility] [place] What should I tell my health care provider before I take this medicine? They need to know if you have any of these conditions: -an unusual or allergic reaction to terbinafine, other medicines, foods, dyes, or preservatives -pregnant or trying to get pregnant -breast-feeding How should I use this medicine? This medicine is for external use only. Do not take by mouth. Follow the directions on  the prescription label. Wash your hands before and after use. If treating hand or nail infections, wash hands before use only. Apply enough product to cover the affected skin or nail and surrounding area. Do not cover the treated area with a bandage or dressing unless your doctor or health care professional tells you to. Do not get this medicine in your eyes. If you do, rinse out with plenty of cool tap water. Use this medicine at regular intervals. Do not use more often than directed. Finish the full course prescribed even if you think your are better. Do not skip doses or stop using this medicine early. Talk to your pediatrician regarding the use of this medicine in children. Special care may be needed. Overdosage: If you think you have taken too much of this medicine contact a poison control center or emergency room at once. NOTE: This medicine is only for you. Do not share this medicine with others. What if I miss a dose? If you miss a dose, apply it as soon as you can. If it is almost time for your next dose, use only that dose. Do not use double or extra doses. What may interact with this medicine? Interactions are not expected. Do not use any other skin products on the affected area without telling your doctor or health care professional. This list may not describe all possible interactions. Give your health care provider a list of all the medicines, herbs, non-prescription drugs, or dietary supplements you use. Also tell them if you smoke, drink alcohol, or use illegal drugs. Some items may interact with your medicine. What should I watch for while using this medicine? Tell your doctor or health care professional if your symptoms do not improve after 1 week. Some fungal infections can take a long time to be cured. Be sure to finish your full course of treatment. After bathing, make sure to dry your skin completely. Most types of fungus live in moist environments. Wear clean socks and clothing  every day. What side effects may I notice from receiving this medicine? Side effects that you should report to your doctor or health care professional as soon as possible: -skin rash, itching -blistering, increased redness, peeling, or swelling of the skin Side effects that usually do not require medical attention (report to your doctor or health care professional if they continue or are bothersome): -dry skin -minor skin irritation, burning, or stinging This list may not describe all possible side effects. Call your doctor for medical advice about side effects. You may report side effects to FDA at 1-800-FDA-1088. Where should I keep my medicine? Keep out of the reach of children. Store at room temperature between 5 abd 25 degrees C (41 and 77 degrees F). Do not refrigerate. Throw away any unused medicine after the expiration date. NOTE: This sheet is a summary. It may not cover all possible information. If you have questions about this medicine, talk to your doctor, pharmacist, or health care provider.  2019 Elsevier/Gold Standard (2016-01-01 16:19:16)

## 2018-12-13 NOTE — Progress Notes (Signed)
Follow Up  Subjective:    Patient ID: Jasmin Stephenson, female    DOB: [birth year], 47 y.o.   MRN: [id number]  Chief Complaint  Patient presents with  . Follow-up    chronic condition  . Eczema    right arm   HPI  Jasmin Stephenson is a 47 year old female with a past medical history of MI, GERD, Depression, Asthma, Arthritis, and Anxiety. She is here today for follow up.   Current Status: Since her last office visit, she is doing well with no complaints. She has had skin rash on right arm for about 1 month now. She has been using Hydrocortisone cream with no relief. She has missed several appointments with Neurology for neck pain, Urologist for bladder issues, and is states that she is scheduled to have abdominal hernia surgery in a few months.   She denies fevers, chills, fatigue, recent infections, weight loss, and night sweats. She has not had any headaches, visual changes, dizziness, and falls. No chest pain, heart palpitations, cough and shortness of breath reported. No reports of GI problems such as nausea, vomiting, diarrhea, and constipation. She has no reports of blood in stools, dysuria and hematuria. No depression or anxiety reported. She denies pain today.   Review of Systems  Constitutional: Negative.   HENT: Negative.   Eyes: Negative.   Respiratory: Negative.   Cardiovascular: Negative.   Gastrointestinal: Negative.   Endocrine: Negative.   Genitourinary: Negative.   Musculoskeletal: Negative.   Skin: Positive for rash (right upper arm).  Allergic/Immunologic: Negative.   Neurological: Negative.   Hematological: Negative.   Psychiatric/Behavioral: Negative.    Objective:   Physical Exam Vitals signs and nursing note reviewed.  Constitutional:      Appearance: Normal appearance.    HENT:     Head: Normocephalic and atraumatic.     Right Ear: Tympanic membrane, ear canal and external ear normal.     Left Ear: Tympanic membrane, ear canal and external ear normal.   Nose: Nose normal.     Mouth/Throat:     Mouth: Mucous membranes are moist.     Pharynx: Oropharynx is clear.  Eyes:     Conjunctiva/sclera: Conjunctivae normal.  Neck:     Musculoskeletal: Normal range of motion and neck supple.  Cardiovascular:     Rate and Rhythm: Normal rate and regular rhythm.     Pulses: Normal pulses.     Heart sounds: Normal heart sounds.  Pulmonary:     Effort: Pulmonary effort is normal.     Breath sounds: Normal breath sounds.  Abdominal:     General: Bowel sounds are normal.  Musculoskeletal: Normal range of motion.  Skin:    General: Skin is warm and dry.     Capillary Refill: Capillary refill takes less than 2 seconds.     Findings: Rash (right upper arm) present.  Neurological:     General: No focal deficit present.     Mental Status: She is alert.  Psychiatric:        Mood and Affect: Mood normal.        Behavior: Behavior normal.        Thought Content: Thought content normal.        Judgment: Judgment normal.    Assessment & Plan:   1. Tinea corporis We will initiate Lamisil today. She will report to office if treatment not effective.  - terbinafine (LAMISIL AT) 1 % cream; Apply 1 application topically 2 (two) times  daily.  Dispense: 30 g; Refill: 1  2. Chronic neck pain Stable today. Referral sent to Neurology for further evaluation. She will make follow up appointment.  3. Overactive bladder She states that past treatments were not effective. She is following up with Urologist.   4. Alopecia  5. Left hip pain Stable. She continues to take OTC pain medications as needed.   6. Follow up She will follow up in 6 months.   Meds ordered this encounter  Medications  . terbinafine (LAMISIL AT) 1 % cream    Sig: Apply 1 application topically 2 (two) times daily.    Dispense:  30 g    Refill:  Agar,  MSN, FNP-C [facility] [address] [place], [place]  [address] [phone]

## 2018-12-19 ENCOUNTER — Ambulatory Visit: Payer: Self-pay | Admitting: Surgery

## 2018-12-19 DIAGNOSIS — K219 Gastro-esophageal reflux disease without esophagitis: Secondary | ICD-10-CM | POA: Diagnosis not present

## 2018-12-19 DIAGNOSIS — K429 Umbilical hernia without obstruction or gangrene: Secondary | ICD-10-CM | POA: Diagnosis not present

## 2018-12-19 NOTE — H&P (Signed)
History of Present Illness Jasmin Stephenson. Morrison Mcbryar MD; 12/19/2018 10:50 AM) The patient is a 48 year old female who presents with an umbilical hernia. Self-referred for umbilical pain/ abdominal pain PCP - Kathe Becton, FNP  This is a 44 hear old female who underwent laparoscopic repair of hiatal hernia about 10 years ago. Apparently, she had primary repair of an umbilical hernia at the same time. This was performed by Dr. Zella Richer. Interestingly, records are not available in Epic from that surgery. The patient reports a 60 lb weight loss over the last 10 years.  Over the last few months, the patient has had several abdominal complaints. She is having pain around her umbilicus that radiates up towards her chest. This is becoming more severe. The pain is intermittent. She also has increasing heartburn symptoms, exacerbated by eating. She experiences heartburn, nausea, occasional diarrhea, and abdominal bloating. She has pain that radiates from her epigastrium to her RUQ. These symptoms are significantly worse after meals. She has not had any evaluation of any of these symptoms. She does not take any PPI or antacids.  Apparently, she had some type of recent foot surgery and suffered a fall or some type of accident, resulting in some type of neck injury. She is reluctant to divulge many details. Apparently, she is contemplating litigation.  We obtained studies to evaluate her Nissen fundoplication as well as her gallbladder. These showed no surgical problems at this time. She was previously seen by [facility].  CLINICAL DATA: Heartburn. History of prior Nissen fundoplication.  EXAM: UPPER GI SERIES WITH KUB  TECHNIQUE: After obtaining a scout radiograph a routine upper GI series was performed using thin and high density barium.  FLUOROSCOPY TIME: Fluoroscopy Time: 2 minutes and 48 seconds  Radiation Exposure Index (if provided by the fluoroscopic device): 214 mGy  Number of  Acquired Spot Images: 0  COMPARISON: CT scan from 2009  FINDINGS: Normal esophageal motility. No intrinsic or extrinsic lesions of the esophagus were identified. There is a small hiatal hernia above the fundoplication. Minimal inducible GE reflux with water swallowing.  The Nissen fundoplication appears intact. No complicating features. The remainder of the stomach appears normal. The duodenal bulb and C-loop are normal. The small bowel appears normal.  IMPRESSION: 1. Nissen fundoplication wrap appears intact. No complicating features. There is a small hiatal hernia just above the wrap. 2. Minimal inducible GE reflux with water swallowing. 3. Normal appearance of the stomach and duodenum.   Electronically Signed By: Marijo Sanes M.D. On: 11/14/2018 12:19  CLINICAL DATA: Abdominal pain 1 month.  EXAM: ABDOMEN ULTRASOUND COMPLETE  COMPARISON: CT 11/22/2006  FINDINGS: Gallbladder: No gallstones or wall thickening visualized. No sonographic Murphy sign noted by sonographer.  Common bile duct: Diameter: 3.8 mm.  Liver: No focal lesion identified. Within normal limits in parenchymal echogenicity. Portal vein is patent on color Doppler imaging with normal direction of blood flow towards the liver.  IVC: No abnormality visualized.  Pancreas: Visualized portion unremarkable.  Spleen: Size and appearance within normal limits.  Right Kidney: Length: 9.4 cm. Echogenicity within normal limits. No mass or hydronephrosis visualized.  Left Kidney: Length: 10.2 cm. Echogenicity within normal limits. No mass or hydronephrosis visualized.  Abdominal aorta: No aneurysm visualized.  Other findings: None.  IMPRESSION: No acute findings.   Electronically Signed By: Marin Olp M.D. On: 11/14/2018 14:32  CLINICAL DATA: Right upper quadrant pain  EXAM: NUCLEAR MEDICINE HEPATOBILIARY IMAGING WITH GALLBLADDER EF  VIEWS: Anterior, right lateral right upper  quadrant  RADIOPHARMACEUTICALS: 5.1 mCi Tc-15m Choletec IV  COMPARISON: None.  FINDINGS: Liver uptake of radiotracer is unremarkable. There is prompt visualization of gallbladder and small bowel, indicating patency of the cystic and common bile ducts. The patient consumed 8 ounces of Ensure Enlive orally with calculation of the computer generated ejection fraction of radiotracer from the gallbladder. The patient did not experience clinical symptoms post Ensure consumption. The computer generated ejection fraction of radiotracer from the gallbladder is normal at 78%, normal greater than 33% using the oral agent.  IMPRESSION: Study within normal limits.   Electronically Signed By: Lowella Grip III M.D. On: 11/25/2018 13:55     Problem List/Past Medical [name] K. 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 Donnamaria Shands, MD; 12/19/2018 10:50 AM) Foot Surgery Bilateral. Resection of Stomach Spinal Surgery - Neck  Diagnostic Studies History Jasmin Stephenson. Dodge Ator, MD; 12/19/2018 10:50 AM) Colonoscopy never Mammogram within last year Pap Smear 1-5 years ago  Allergies Sabino Gasser, [place]; 12/19/2018 10:27 AM) Penicillins No Known Drug Allergies [10/02/2014]: (Marked as Inactive) Allergies Reconciled  Medication History Sabino Gasser, CMA; 12/19/2018 10:27 AM) Proventil HFA (108 (90 Base)MCG/ACT Aerosol Soln, Inhalation) Active. Cleocin (300MG  Capsule, Oral) Active. Medications Reconciled  Social History Jasmin Stephenson. Jadien Lehigh, MD; 12/19/2018 10:50 AM) Alcohol use Occasional alcohol use. Caffeine use Tea. Tobacco use Current every day smoker.  Family History Jasmin Stephenson. Yahel Fuston, MD; 12/19/2018 10:50 AM) Diabetes Mellitus Father, Mother. Arthritis Mother. Breast Cancer Sister. Heart Disease  Father. Heart disease in female family member before age 34 Hypertension Father.  Pregnancy / Birth History Jasmin Stephenson. Calton Harshfield, MD; 12/19/2018 10:50 AM) Age at menarche 28 years. Gravida 3 Maternal age 35-20 Para 3 Regular periods  Other Problems Jasmin Stephenson. Jerusalem Brownstein, MD; 12/19/2018 10:50 AM) Anxiety Disorder Arthritis Back Pain Bladder Problems Chest pain Depression Gastroesophageal Reflux Disease High blood pressure    Vitals Sabino Gasser CMA; 12/19/2018 10:27 AM) 12/19/2018 10:27 AM Weight: 218.13 lb Height: 64in Body Surface Area: 2.03 m Body Mass Index: 37.44 kg/m  Temp.: 98.34F(Oral)  Pulse: 112 (Regular)  BP: 136/84 (Sitting, Left Arm, Standard)      Physical Exam [name] K. Shailah Gibbins MD; 12/19/2018 10:51 AM)  The physical exam findings are as follows: Note:WDWN in NAD Eyes: Pupils equal, round; sclera anicteric HENT: Oral mucosa moist; good dentition Neck: No masses palpated, no thyromegaly Lungs: CTA bilaterally; normal respiratory effort CV: Regular rate and rhythm; no murmurs; extremities well-perfused with no edema Abd: +bowel sounds, soft, healed laparoscopic incisions; RUQ and epigastric tenderness; protruding umbilical hernia with 1 cm palpable defect Well-healed laparoscopic incisions Skin: Warm, dry; no sign of jaundice Psychiatric - alert and oriented x 4; calm mood and affect    Assessment & Plan [name] K. Laretta Pyatt MD; 12/19/2018 10:44 AM)  RECURRENT UMBILICAL HERNIA (Q11.9)  Current Plans Schedule for Surgery - Open repair of recurrent umbilical hernia with mesh. The surgical procedure has been discussed with the patient. Potential risks, benefits, alternative treatments, and expected outcomes have been explained. All of the patient's questions at this time have been answered. The likelihood of reaching the patient's treatment goal is good. The patient understand the proposed surgical procedure and wishes to  proceed.  CHRONIC GERD (K21.9)  Note:Referral to [facility] for follow-up of chronic GERD after Nissen fundoplication   Jasmin Stephenson. Georgette Dover, MD, [facility] Surgery  General/ Trauma Surgery Bee[place] [phone]  [phone]  12/19/2018 10:51 AM

## 2018-12-28 DIAGNOSIS — F331 Major depressive disorder, recurrent, moderate: Secondary | ICD-10-CM | POA: Diagnosis not present

## 2018-12-30 ENCOUNTER — Telehealth: Payer: Self-pay

## 2018-12-30 NOTE — Telephone Encounter (Signed)
Patient states that the cream is not helping with rash on her arm and would like to have something else sent in to [facility].

## 2019-01-02 ENCOUNTER — Other Ambulatory Visit: Payer: Self-pay | Admitting: Family Medicine

## 2019-01-02 DIAGNOSIS — B354 Tinea corporis: Secondary | ICD-10-CM

## 2019-01-02 MED ORDER — CLOTRIMAZOLE 1 % EX CREA: 1.0000 "application " | TOPICAL_CREAM | Freq: Two times a day (BID) | CUTANEOUS | 0 refills | Status: DC

## 2019-01-03 NOTE — Telephone Encounter (Signed)
Left a vm for patient to callback 

## 2019-01-05 NOTE — Telephone Encounter (Signed)
Left a detail message

## 2019-01-10 DIAGNOSIS — K219 Gastro-esophageal reflux disease without esophagitis: Secondary | ICD-10-CM | POA: Diagnosis not present

## 2019-01-10 DIAGNOSIS — Z1211 Encounter for screening for malignant neoplasm of colon: Secondary | ICD-10-CM | POA: Diagnosis not present

## 2019-01-24 ENCOUNTER — Ambulatory Visit (INDEPENDENT_AMBULATORY_CARE_PROVIDER_SITE_OTHER): Payer: Medicaid Other | Admitting: Family Medicine

## 2019-01-24 ENCOUNTER — Encounter: Payer: Self-pay | Admitting: Family Medicine

## 2019-01-24 VITALS — BP 102/90 | HR 84 | Temp 100.2°F | Ht 65.0 in | Wt 212.0 lb

## 2019-01-24 DIAGNOSIS — J029 Acute pharyngitis, unspecified: Secondary | ICD-10-CM

## 2019-01-24 DIAGNOSIS — R6889 Other general symptoms and signs: Secondary | ICD-10-CM | POA: Diagnosis not present

## 2019-01-24 DIAGNOSIS — B354 Tinea corporis: Secondary | ICD-10-CM | POA: Diagnosis not present

## 2019-01-24 DIAGNOSIS — Z09 Encounter for follow-up examination after completed treatment for conditions other than malignant neoplasm: Secondary | ICD-10-CM | POA: Diagnosis not present

## 2019-01-24 LAB — INFLUENZA PANEL BY PCR (TYPE A & B)
Influenza A By PCR: NEGATIVE
Influenza B By PCR: NEGATIVE

## 2019-01-24 LAB — POCT RAPID STREP A (OFFICE): Rapid Strep A Screen: NEGATIVE

## 2019-01-24 MED ORDER — FLUCONAZOLE 150 MG PO TABS: ORAL_TABLET | ORAL | 0 refills | Status: DC

## 2019-01-24 NOTE — Progress Notes (Signed)
Patient [facility]  Sick Visit  Subjective:  Patient ID: Jasmin Stephenson, female    DOB: May 30, 1971  Age: 48 y.o. MRN: [id number]  CC:  Chief Complaint  Patient presents with  . Cough  . Nasal Congestion  . Sore Throat   HPI Jasmin Stephenson is a 47 year old female presents for Sick Visit today.   Past Medical History:  Diagnosis Date  . Anxiety   . Arthritis    and tendonitis  . Asthma   . Breast calcification, right   . Cervical herniated disc   . Depression   . GERD (gastroesophageal reflux disease)   . Myocardial infarction ([place])    " Mild"   Current Status: Since her last office visit, she has been having cold/flu symptoms for the past 3 days. Symptoms include cough, chills, fatigue, body aches, and fevers. She has been taking OTC cold medications as directed. She denies recent infections, weight loss, and night sweats. She has not had any headaches, visual changes, dizziness, and falls. No chest pain, heart palpitations, cough and shortness of breath reported. No reports of GI problems such as nausea, vomiting, diarrhea, and constipation. She has no reports of blood in stools, dysuria and hematuria. No depression or anxiety reported.    Past Surgical History:  Procedure Laterality Date  . ABDOMINAL SURGERY    . APPENDECTOMY    . BLADDER SURGERY    . CERVICAL DISC ARTHROPLASTY N/A 09/21/2016   Procedure: CERVICAL SIX- CERVICAL SEVEN ARTHROPLASTY;  Surgeon: Kevan Ny Ditty, MD;  Location: [facility];  Service: Neurosurgery;  Laterality: N/A;  C6-7 Arthroplasty  . IRRIGATION AND DEBRIDEMENT ABSCESS Right 09/10/2014   Procedure: IRRIGATION AND DEBRIDEMENT RIGHT BREAST  ABSCESS;  Surgeon: Gayland Curry, MD;  Location: [facility];  Service: General;  Laterality: Right;  . WISDOM TOOTH EXTRACTION      Family History  Problem Relation Age of Onset  . Diabetes Father   . Breast cancer Sister 29  . Alcohol abuse Neg Hx   . Arthritis  Neg Hx   . Asthma Neg Hx   . Birth defects Neg Hx   . Cancer Neg Hx   . COPD Neg Hx   . Depression Neg Hx   . Drug abuse Neg Hx   . Early death Neg Hx   . Hearing loss Neg Hx   . Heart disease Neg Hx   . Hyperlipidemia Neg Hx   . Hypertension Neg Hx   . Kidney disease Neg Hx   . Learning disabilities Neg Hx   . Mental illness Neg Hx   . Mental retardation Neg Hx   . Miscarriages / Stillbirths Neg Hx   . Stroke Neg Hx   . Vision loss Neg Hx     Social History   Socioeconomic History  . Marital status: Single    Spouse name: Not on file  . Number of children: Not on file  . Years of education: Not on file  . Highest education level: Not on file  Occupational History  . Not on file  Social Needs  . Financial resource strain: Not on file  . Food insecurity:    Worry: Not on file    Inability: Not on file  . Transportation needs:    Medical: Not on file    Non-medical: Not on file  Tobacco Use  . Smoking status: Current Every Day Smoker    Packs/day: 1.50  Years: 7.00    Pack years: 10.50    Types: Cigarettes  . Smokeless tobacco: Never Used  Substance and Sexual Activity  . Alcohol use: No  . Drug use: Yes    Types: Marijuana    Comment: 2-3 months ago  . Sexual activity: Yes    Birth control/protection: Surgical    Comment: last intercourse 27th Sep 2016  Lifestyle  . Physical activity:    Days per week: Not on file    Minutes per session: Not on file  . Stress: Not on file  Relationships  . Social connections:    Talks on phone: Not on file    Gets together: Not on file    Attends religious service: Not on file    Active member of club or organization: Not on file    Attends meetings of clubs or organizations: Not on file    Relationship status: Not on file  . Intimate partner violence:    Fear of current or ex partner: Not on file    Emotionally abused: Not on file    Physically abused: Not on file    Forced sexual activity: Not on file  Other  Topics Concern  . Not on file  Social History Narrative  . Not on file    Outpatient Medications Prior to Visit  Medication Sig Dispense Refill  . albuterol (PROVENTIL HFA;VENTOLIN HFA) 108 (90 Base) MCG/ACT inhaler Inhale 2 puffs into the lungs every 4 (four) hours as needed for wheezing or shortness of breath (cough, shortness of breath or wheezing.). (Patient not taking: Reported on 12/13/2018) 1 Inhaler 1  . clotrimazole (LOTRIMIN AF) 1 % cream Apply 1 application topically 2 (two) times daily. (Patient not taking: Reported on 01/24/2019) 30 g 0  . oxybutynin (DITROPAN) 5 MG tablet Take 1 tablet (5 mg total) by mouth 3 (three) times daily. (Patient not taking: Reported on 08/30/2018) 90 tablet 1  . oxyCODONE-acetaminophen (PERCOCET) 10-325 MG tablet Take 1 tablet by mouth every 4 (four) hours as needed for pain. (Patient not taking: Reported on 08/30/2018) 20 tablet 0  . terbinafine (LAMISIL AT) 1 % cream Apply 1 application topically 2 (two) times daily. (Patient not taking: Reported on 01/24/2019) 30 g 1   No facility-administered medications prior to visit.     Allergies  Allergen Reactions  . Penicillins Nausea And Vomiting and Rash    Has patient had a PCN reaction causing immediate rash, facial/tongue/throat swelling, SOB or lightheadedness with hypotension: Yes Has patient had a PCN reaction causing severe rash involving mucus membranes or skin necrosis: Yes Has patient had a PCN reaction that required hospitalization No Has patient had a PCN reaction occurring within the last 10 years: No If all of the above answers are "NO", then may proceed with Cephalosporin use.    ROS Review of Systems  Constitutional: Negative.   HENT: Negative.   Eyes: Negative.   Respiratory: Negative.   Cardiovascular: Negative.   Gastrointestinal: Negative.   Endocrine: Negative.   Genitourinary: Negative.   Musculoskeletal: Negative.   Skin: Negative.   Allergic/Immunologic: Negative.     Neurological: Negative.   Hematological: Negative.   Psychiatric/Behavioral: Negative.    Objective:    Physical Exam  Constitutional: She is oriented to person, place, and time. She appears well-developed and well-nourished.  HENT:  Head: Normocephalic and atraumatic.  Eyes: Conjunctivae are normal.  Neck: Normal range of motion. Neck supple.  Cardiovascular: Normal rate and regular rhythm.  Pulmonary/Chest: Effort  normal and breath sounds normal.  Abdominal: Soft. Bowel sounds are normal.  Musculoskeletal: Normal range of motion.  Neurological: She is alert and oriented to person, place, and time. She has normal reflexes.  Skin: Skin is warm and dry.  Psychiatric: She has a normal mood and affect. Her behavior is normal. Judgment and thought content normal.  Nursing note and vitals reviewed.   BP 102/90 (BP Location: Left Arm, Patient Position: Sitting, Cuff Size: Large)   Pulse 84   Temp 100.2 F (37.9 C) (Oral)   Ht 5\' 5"  (1.651 m)   Wt 212 lb (96.2 kg)   BMI 35.28 kg/m  Wt Readings from Last 3 Encounters:  01/24/19 212 lb (96.2 kg)  12/13/18 216 lb (98 kg)  08/30/18 210 lb (95.3 kg)    There are no preventive care reminders to display for this patient.  There are no preventive care reminders to display for this patient.  Lab Results  Component Value Date   TSH 1.180 11/17/2017   Lab Results  Component Value Date   WBC 8.2 11/17/2017   HGB 11.7 11/17/2017   HCT 37.4 11/17/2017   MCV 86 11/17/2017   PLT 517 (H) 11/17/2017   Lab Results  Component Value Date   NA 142 11/17/2017   K 4.2 11/17/2017   CO2 22 11/17/2017   GLUCOSE 88 11/17/2017   BUN 4 (L) 11/17/2017   CREATININE 0.94 11/17/2017   BILITOT 0.3 11/17/2017   ALKPHOS 99 11/17/2017   AST 16 11/17/2017   ALT 11 11/17/2017   PROT 7.5 11/17/2017   ALBUMIN 4.3 11/17/2017   CALCIUM 9.5 11/17/2017   ANIONGAP 8 09/21/2016   Lab Results  Component Value Date   CHOL 167 11/17/2017   Lab  Results  Component Value Date   HDL 46 11/17/2017   Lab Results  Component Value Date   LDLCALC 99 11/17/2017   Lab Results  Component Value Date   TRIG 112 11/17/2017   Lab Results  Component Value Date   CHOLHDL 3.6 11/17/2017   Lab Results  Component Value Date   HGBA1C 5.7 (A) 08/30/2018   Assessment & Plan:   1. Flu-like symptoms  2. Sore throat Strep test is negative.  - Rapid Strep A - Culture, Group A Strep  3. Tinea corporis Creams are ineffective. We will initiate Diflucan today.  - fluconazole (DIFLUCAN) 150 MG tablet; Take 1 tablet once every Tuesday X 4 weeks. (Weekly dosage)  Dispense: 4 tablet; Refill: 0  4. Follow up She will follow up 05/2019.  Meds ordered this encounter  Medications  . fluconazole (DIFLUCAN) 150 MG tablet    Sig: Take 1 tablet once every Tuesday X 4 weeks. (Weekly dosage)    Dispense:  4 tablet    Refill:  0    Kathe Becton,  MSN, FNP-C Patient [facility] [place], [place] [address] [phone]   Problem List Items Addressed This Visit    None    Visit Diagnoses    Flu-like symptoms    -  Primary   Sore throat       Relevant Orders   Rapid Strep A (Completed)   Culture, Group A Strep   Tinea corporis       Relevant Medications   fluconazole (DIFLUCAN) 150 MG tablet   Follow up          Meds ordered this encounter  Medications  . fluconazole (DIFLUCAN) 150  MG tablet    Sig: Take 1 tablet once every Tuesday X 4 weeks. (Weekly dosage)    Dispense:  4 tablet    Refill:  0    Follow-up: No follow-ups on file.    [name], FNP

## 2019-01-24 NOTE — Patient Instructions (Addendum)
Body Ringworm Body ringworm is an infection of the skin that often causes a ring-shaped rash. Body ringworm can affect any part of your skin. It can spread easily to others. Body ringworm is also called tinea corporis. What are the causes? This condition is caused by funguses called dermatophytes. The condition develops when these funguses grow out of control on the skin. You can get this condition if you touch a person or animal that has it. You can also get it if you share clothing, bedding, towels, or any other object with an infected person or pet. What increases the risk? This condition is more likely to develop in:  Athletes who often make skin-to-skin contact with other athletes, such as wrestlers.  People who share equipment and mats.  People with a weakened immune system. What are the signs or symptoms? Symptoms of this condition include:  Itchy, raised red spots and bumps.  Red scaly patches.  A ring-shaped rash. The rash may have: ? A clear center. ? Scales or red bumps at its center. ? Redness near its borders. ? Dry and scaly skin on or around it. How is this diagnosed? This condition can usually be diagnosed with a skin exam. A skin scraping may be taken from the affected area and examined under a microscope to see if the fungus is present. How is this treated? This condition may be treated with:  An antifungal cream or ointment.  An antifungal shampoo.  Antifungal medicines. These may be prescribed if your ringworm is severe, keeps coming back, or lasts a long time. Follow these instructions at home:  Take over-the-counter and prescription medicines only as told by your health care provider.  If you were given an antifungal cream or ointment: ? Use it as told by your health care provider. ? Wash the infected area and dry it completely before applying the cream or ointment.  If you were given an antifungal shampoo: ? Use it as told by your health care  provider. ? Leave the shampoo on your body for 3-5 minutes before rinsing.  While you have a rash: ? Wear loose clothing to stop clothes from rubbing and irritating it. ? Wash or change your bed sheets every night.  If your pet has the same infection, take your pet to see a Animal nutritionist. How is this prevented?  Practice good hygiene.  Wear sandals or shoes in public places and showers.  Do not share personal items with others.  Avoid touching red patches of skin on other people.  Avoid touching pets that have bald spots.  If you touch an animal that has a bald spot, wash your hands. Contact a health care provider if:  Your rash continues to spread after 7 days of treatment.  Your rash is not gone in 4 weeks.  The area around your rash gets red, warm, tender, and swollen. This information is not intended to replace advice given to you by your health care provider. Make sure you discuss any questions you have with your health care provider. Document Released: 11/27/2000 Document Revised: 05/07/2016 Document Reviewed: 09/26/2015 Elsevier Interactive Patient Education  2019 [employer]   Fluconazole tablets What is this medicine? FLUCONAZOLE (floo KON na zole) is an antifungal medicine. It is used to treat certain kinds of fungal or yeast infections. This medicine may be used for other purposes; ask your health care provider or pharmacist if you have questions. COMMON BRAND NAME(S): Diflucan What should I tell my health care provider before  I take this medicine? They need to know if you have any of these conditions: -history of irregular heart beat -kidney disease -an unusual or allergic reaction to fluconazole, other azole antifungals, medicines, foods, dyes, or preservatives -pregnant or trying to get pregnant -breast-feeding How should I use this medicine? Take this medicine by mouth. Follow the directions on the prescription label. Do not take your medicine more often  than directed. Talk to your pediatrician regarding the use of this medicine in children. Special care may be needed. This medicine has been used in children as young as 74 months of age. Overdosage: If you think you have taken too much of this medicine contact a poison control center or emergency room at once. NOTE: This medicine is only for you. Do not share this medicine with others. What if I miss a dose? If you miss a dose, take it as soon as you can. If it is almost time for your next dose, take only that dose. Do not take double or extra doses. What may interact with this medicine? Do not take this medicine with any of the following medications: -astemizole -certain medicines for irregular heart beat like dofetilide, dronedarone, quinidine -cisapride -erythromycin -lomitapide -other medicines that prolong the QT interval (cause an abnormal heart rhythm) -pimozide -terfenadine -thioridazine -tolvaptan -ziprasidone This medicine may also interact with the following medications: -antiviral medicines for HIV or AIDS -birth control pills -certain antibiotics like rifabutin, rifampin -certain medicines for blood pressure like amlodipine, isradipine, felodipine, hydrochlorothiazide, losartan, nifedipine -certain medicines for cancer like cyclophosphamide, vinblastine, vincristine -certain medicines for cholesterol like atorvastatin, lovastatin, fluvastatin, simvastatin -certain medicines for depression, anxiety, or psychotic disturbances like amitriptyline, midazolam, nortriptyline, triazolam -certain medicines for diabetes like glipizide, glyburide, tolbutamide -certain medicines for pain like alfentanil, fentanyl, methadone -certain medicines for seizures like carbamazepine, phenytoin -certain medicines that treat or prevent blood clots like warfarin -halofantrine -medicines that lower your chance of fighting infection like cyclosporine, prednisone, tacrolimus -NSAIDS, medicines for  pain and inflammation, like celecoxib, diclofenac, flurbiprofen, ibuprofen, meloxicam, naproxen -other medicines for fungal infections -sirolimus -theophylline -tofacitinib This list may not describe all possible interactions. Give your health care provider a list of all the medicines, herbs, non-prescription drugs, or dietary supplements you use. Also tell them if you smoke, drink alcohol, or use illegal drugs. Some items may interact with your medicine. What should I watch for while using this medicine? Visit your doctor or health care professional for regular checkups. If you are taking this medicine for a long time you may need blood work. Tell your doctor if your symptoms do not improve. Some fungal infections need many weeks or months of treatment to cure. Alcohol can increase possible damage to your liver. Avoid alcoholic drinks. If you have a vaginal infection, do not have sex until you have finished your treatment. You can wear a sanitary napkin. Do not use tampons. Wear freshly washed cotton, not synthetic, panties. What side effects may I notice from receiving this medicine? Side effects that you should report to your doctor or health care professional as soon as possible: -allergic reactions like skin rash or itching, hives, swelling of the lips, mouth, tongue, or throat -dark urine -feeling dizzy or faint -irregular heartbeat or chest pain -redness, blistering, peeling or loosening of the skin, including inside the mouth -trouble breathing -unusual bruising or bleeding -vomiting -yellowing of the eyes or skin Side effects that usually do not require medical attention (report to your doctor or health care professional if  they continue or are bothersome): -changes in how food tastes -diarrhea -headache -stomach upset or nausea This list may not describe all possible side effects. Call your doctor for medical advice about side effects. You may report side effects to FDA at  1-800-FDA-1088. Where should I keep my medicine? Keep out of the reach of children. Store at room temperature below 30 degrees C (86 degrees F). Throw away any medicine after the expiration date. NOTE: This sheet is a summary. It may not cover all possible information. If you have questions about this medicine, talk to your doctor, pharmacist, or health care provider.  2019 Elsevier/Gold Standard (2013-07-08 19:37:38) Ibuprofen tablets and capsules What is this medicine? IBUPROFEN (eye BYOO proe fen) is a non-steroidal anti-inflammatory drug (NSAID). It is used for dental pain, fever, headaches or migraines, osteoarthritis, rheumatoid arthritis, or painful monthly periods. It can also relieve minor aches and pains caused by a cold, flu, or sore throat. This medicine may be used for other purposes; ask your health care provider or pharmacist if you have questions. COMMON BRAND NAME(S): Advil, Advil Junior Strength, Advil Migraine, Genpril, Ibren, IBU, Midol, Midol Cramps and Body Aches, Motrin, Motrin IB, Motrin Junior Strength, Motrin Migraine Pain, Samson-8, Toxicology Saliva Collection What should I tell my health care provider before I take this medicine? They need to know if you have any of these conditions: -cigarette smoker -coronary artery bypass graft (CABG) surgery within the past 2 weeks -drink more than 3 alcohol-containing drinks a day -heart disease -high blood pressure -history of stomach bleeding -kidney disease -liver disease -lung or breathing disease, like asthma -an unusual or allergic reaction to ibuprofen, aspirin, other NSAIDs, other medicines, foods, dyes, or preservatives -pregnant or trying to get pregnant -breast-feeding How should I use this medicine? Take this medicine by mouth with a glass of water. Follow the directions on the prescription label. Take this medicine with food if your stomach gets upset. Try to not lie down for at least 10 minutes after you take  the medicine. Take your medicine at regular intervals. Do not take your medicine more often than directed. A special MedGuide will be given to you by the pharmacist with each prescription and refill. Be sure to read this information carefully each time. Talk to your pediatrician regarding the use of this medicine in children. Special care may be needed. Overdosage: If you think you have taken too much of this medicine contact a poison control center or emergency room at once. NOTE: This medicine is only for you. Do not share this medicine with others. What if I miss a dose? If you miss a dose, take it as soon as you can. If it is almost time for your next dose, take only that dose. Do not take double or extra doses. What may interact with this medicine? Do not take this medicine with any of the following medications: -cidofovir -ketorolac -methotrexate -pemetrexed This medicine may also interact with the following medications: -alcohol -aspirin -diuretics -lithium -other drugs for inflammation like prednisone -warfarin This list may not describe all possible interactions. Give your health care provider a list of all the medicines, herbs, non-prescription drugs, or dietary supplements you use. Also tell them if you smoke, drink alcohol, or use illegal drugs. Some items may interact with your medicine. What should I watch for while using this medicine? Tell your doctor or healthcare professional if your symptoms do not start to get better or if they get worse. This medicine does not  prevent heart attack or stroke. In fact, this medicine may increase the chance of a heart attack or stroke. The chance may increase with longer use of this medicine and in people who have heart disease. If you take aspirin to prevent heart attack or stroke, talk with your doctor or health care professional. Do not take other medicines that contain aspirin, ibuprofen, or naproxen with this medicine. Side effects such  as stomach upset, nausea, or ulcers may be more likely to occur. Many medicines available without a prescription should not be taken with this medicine. This medicine can cause ulcers and bleeding in the stomach and intestines at any time during treatment. Ulcers and bleeding can happen without warning symptoms and can cause death. To reduce your risk, do not smoke cigarettes or drink alcohol while you are taking this medicine. You may get drowsy or dizzy. Do not drive, use machinery, or do anything that needs mental alertness until you know how this medicine affects you. Do not stand or sit up quickly, especially if you are an older patient. This reduces the risk of dizzy or fainting spells. This medicine can cause you to bleed more easily. Try to avoid damage to your teeth and gums when you brush or floss your teeth. This medicine may be used to treat migraines. If you take migraine medicines for 10 or more days a month, your migraines may get worse. Keep a diary of headache days and medicine use. Contact your healthcare professional if your migraine attacks occur more frequently. What side effects may I notice from receiving this medicine? Side effects that you should report to your doctor or health care professional as soon as possible: -allergic reactions like skin rash, itching or hives, swelling of the face, lips, or tongue -severe stomach pain -signs and symptoms of bleeding such as bloody or black, tarry stools; red or dark-brown urine; spitting up blood or brown material that looks like coffee grounds; red spots on the skin; unusual bruising or bleeding from the eye, gums, or nose -signs and symptoms of a blood clot such as changes in vision; chest pain; severe, sudden headache; trouble speaking; sudden numbness or weakness of the face, arm, or leg -unexplained weight gain or swelling -unusually weak or tired -yellowing of eyes or skin Side effects that usually do not require medical  attention (report to your doctor or health care professional if they continue or are bothersome): -bruising -diarrhea -dizziness, drowsiness -headache -nausea, vomiting This list may not describe all possible side effects. Call your doctor for medical advice about side effects. You may report side effects to FDA at 1-800-FDA-1088. Where should I keep my medicine? Keep out of the reach of children. Store at room temperature between 15 and 30 degrees C (59 and 86 degrees F). Keep container tightly closed. Throw away any unused medicine after the expiration date. NOTE: This sheet is a summary. It may not cover all possible information. If you have questions about this medicine, talk to your doctor, pharmacist, or health care provider.  2019 Elsevier/Gold Standard (2017-08-04 12:43:57) Guaifenesin oral ER tablets What is this medicine? GUAIFENESIN (gwye FEN e sin) is an expectorant. It helps to thin mucous and make coughs more productive. This medicine is used to treat coughs caused by colds or the flu. It is not intended to treat chronic cough caused by smoking, asthma, emphysema, or heart failure. This medicine may be used for other purposes; ask your health care provider or pharmacist if you have questions.  COMMON BRAND NAME(S): Humibid, Mucinex What should I tell my health care provider before I take this medicine? They need to know if you have any of these conditions: -fever -kidney disease -an unusual or allergic reaction to guaifenesin, other medicines, foods, dyes, or preservatives -pregnant or trying to get pregnant -breast-feeding How should I use this medicine? Take this medicine by mouth with a full glass of water. Follow the directions on the prescription label. Do not break, chew or crush this medicine. You may take with food or on an empty stomach. Take your medicine at regular intervals. Do not take your medicine more often than directed. Talk to your pediatrician regarding the  use of this medicine in children. While this drug may be prescribed for children as young as 76 years old for selected conditions, precautions do apply. Overdosage: If you think you have taken too much of this medicine contact a poison control center or emergency room at once. NOTE: This medicine is only for you. Do not share this medicine with others. What if I miss a dose? If you miss a dose, take it as soon as you can. If it is almost time for your next dose, take only that dose. Do not take double or extra doses. What may interact with this medicine? Interactions are not expected. This list may not describe all possible interactions. Give your health care provider a list of all the medicines, herbs, non-prescription drugs, or dietary supplements you use. Also tell them if you smoke, drink alcohol, or use illegal drugs. Some items may interact with your medicine. What should I watch for while using this medicine? Do not treat a cough for more than 1 week without consulting your doctor or health care professional. If you also have a high fever, skin rash, continuing headache, or sore throat, see your doctor. For best results, drink 6 to 8 glasses water daily while you are taking this medicine. What side effects may I notice from receiving this medicine? Side effects that you should report to your doctor or health care professional as soon as possible: -allergic reactions like skin rash, itching or hives, swelling of the face, lips, or tongue Side effects that usually do not require medical attention (report to your doctor or health care professional if they continue or are bothersome): -dizziness -headache -stomach upset This list may not describe all possible side effects. Call your doctor for medical advice about side effects. You may report side effects to FDA at 1-800-FDA-1088. Where should I keep my medicine? Keep out of the reach of children. Store at room temperature between 20 and 25  degrees C (68 and 77 degrees F). Keep container tightly closed. Throw away any unused medicine after the expiration date. NOTE: This sheet is a summary. It may not cover all possible information. If you have questions about this medicine, talk to your doctor, pharmacist, or health care provider.  2019 Elsevier/Gold Standard (2008-04-11 12:14:14)

## 2019-01-25 DIAGNOSIS — B354 Tinea corporis: Secondary | ICD-10-CM | POA: Insufficient documentation

## 2019-01-27 LAB — CULTURE, GROUP A STREP: Strep A Culture: NEGATIVE

## 2019-02-07 DIAGNOSIS — K429 Umbilical hernia without obstruction or gangrene: Secondary | ICD-10-CM | POA: Diagnosis not present

## 2019-02-14 DIAGNOSIS — F331 Major depressive disorder, recurrent, moderate: Secondary | ICD-10-CM | POA: Diagnosis not present

## 2019-02-22 DIAGNOSIS — F331 Major depressive disorder, recurrent, moderate: Secondary | ICD-10-CM | POA: Diagnosis not present

## 2019-02-23 DIAGNOSIS — D125 Benign neoplasm of sigmoid colon: Secondary | ICD-10-CM | POA: Diagnosis not present

## 2019-02-23 DIAGNOSIS — K2930 Chronic superficial gastritis without bleeding: Secondary | ICD-10-CM | POA: Diagnosis not present

## 2019-02-23 DIAGNOSIS — Z1211 Encounter for screening for malignant neoplasm of colon: Secondary | ICD-10-CM | POA: Diagnosis not present

## 2019-02-23 DIAGNOSIS — K5730 Diverticulosis of large intestine without perforation or abscess without bleeding: Secondary | ICD-10-CM | POA: Diagnosis not present

## 2019-02-23 DIAGNOSIS — D123 Benign neoplasm of transverse colon: Secondary | ICD-10-CM | POA: Diagnosis not present

## 2019-02-23 DIAGNOSIS — Z9889 Other specified postprocedural states: Secondary | ICD-10-CM | POA: Diagnosis not present

## 2019-03-05 ENCOUNTER — Telehealth: Payer: Self-pay

## 2019-03-05 NOTE — Telephone Encounter (Signed)
Patient telephoned regarding their 1000 sick visit appointment on 03/06/2019.  Message left offering a telephone visit with the provider with practice administrators' number for return call.

## 2019-03-06 ENCOUNTER — Ambulatory Visit (INDEPENDENT_AMBULATORY_CARE_PROVIDER_SITE_OTHER): Payer: Medicaid Other | Admitting: Family Medicine

## 2019-03-06 ENCOUNTER — Encounter: Payer: Self-pay | Admitting: Family Medicine

## 2019-03-06 ENCOUNTER — Other Ambulatory Visit: Payer: Self-pay

## 2019-03-06 VITALS — BP 144/80 | HR 74 | Temp 98.0°F | Ht 65.0 in | Wt 217.0 lb

## 2019-03-06 DIAGNOSIS — J302 Other seasonal allergic rhinitis: Secondary | ICD-10-CM

## 2019-03-06 DIAGNOSIS — J45909 Unspecified asthma, uncomplicated: Secondary | ICD-10-CM | POA: Diagnosis not present

## 2019-03-06 DIAGNOSIS — Z0000 Encounter for general adult medical examination without abnormal findings: Secondary | ICD-10-CM | POA: Diagnosis not present

## 2019-03-06 DIAGNOSIS — M7021 Olecranon bursitis, right elbow: Secondary | ICD-10-CM | POA: Diagnosis not present

## 2019-03-06 DIAGNOSIS — Z09 Encounter for follow-up examination after completed treatment for conditions other than malignant neoplasm: Secondary | ICD-10-CM

## 2019-03-06 MED ORDER — CETIRIZINE HCL 10 MG PO TABS: 10.0000 mg | ORAL_TABLET | Freq: Every day | ORAL | 11 refills | Status: DC

## 2019-03-06 MED ORDER — ALBUTEROL SULFATE (2.5 MG/3ML) 0.083% IN NEBU: 2.5000 mg | INHALATION_SOLUTION | Freq: Four times a day (QID) | RESPIRATORY_TRACT | 12 refills | Status: DC | PRN

## 2019-03-06 MED ORDER — ALBUTEROL SULFATE HFA 108 (90 BASE) MCG/ACT IN AERS: 2.0000 | INHALATION_SPRAY | RESPIRATORY_TRACT | 11 refills | Status: DC | PRN

## 2019-03-06 MED ORDER — FLUTICASONE PROPIONATE 50 MCG/ACT NA SUSP: 2.0000 | Freq: Every day | NASAL | 6 refills | Status: DC

## 2019-03-06 NOTE — Patient Instructions (Signed)
Fluticasone nasal spray What is this medicine? FLUTICASONE (floo TIK a sone) is a corticosteroid. This medicine is used to treat the symptoms of allergies like sneezing, itchy red eyes, and itchy, runny, or stuffy nose. This medicine is also used to treat nasal polyps. This medicine may be used for other purposes; ask your health care provider or pharmacist if you have questions. COMMON BRAND NAME(S): ClariSpray, Flonase, Flonase Allergy Relief, Flonase Sensimist, Veramyst, XHANCE What should I tell my health care provider before I take this medicine? They need to know if you have any of these conditions: -eye disease, vision problems -infection, like tuberculosis, herpes, or fungal infection -recent surgery on nose or sinuses -taking a corticosteroid by mouth -an unusual or allergic reaction to fluticasone, steroids, other medicines, foods, dyes, or preservatives -pregnant or trying to get pregnant -breast-feeding How should I use this medicine? This medicine is for use in the nose. Follow the directions on your product or prescription label. This medicine works best if used at regular intervals. Do not use more often than directed. Make sure that you are using your nasal spray correctly. After 6 months of daily use for allergies, talk to your doctor or health care professional before using it for a longer time. Ask your doctor or health care professional if you have any questions. Talk to your pediatrician regarding the use of this medicine in children. Special care may be needed. Some products have been used for allergies in children as young as 2 years. After 2 months of daily use without a prescription in a child, talk to your pediatrician before using it for a longer time. Use of this medicine for nasal polyps is not approved in children. Overdosage: If you think you have taken too much of this medicine contact a poison control center or emergency room at once. NOTE: This medicine is only for  you. Do not share this medicine with others. What if I miss a dose? If you miss a dose, use it as soon as you remember. If it is almost time for your next dose, use only that dose and continue with your regular schedule. Do not use double or extra doses. What may interact with this medicine? -certain antibiotics like clarithromycin and telithromycin -certain medicines for fungal infections like ketoconazole, itraconazole, and voriconazole -conivaptan -nefazodone -some medicines for HIV -vaccines This list may not describe all possible interactions. Give your health care provider a list of all the medicines, herbs, non-prescription drugs, or dietary supplements you use. Also tell them if you smoke, drink alcohol, or use illegal drugs. Some items may interact with your medicine. What should I watch for while using this medicine? Visit your healthcare professional for regular checks on your progress. Tell your healthcare professional if your symptoms do not start to get better or if they get worse. This medicine may increase your risk of getting an infection. Tell your doctor or health care professional if you are around anyone with measles or chickenpox, or if you develop sores or blisters that do not heal properly. What side effects may I notice from receiving this medicine? Side effects that you should report to your doctor or health care professional as soon as possible: -allergic reactions like skin rash, itching or hives, swelling of the face, lips, or tongue -changes in vision -crusting or sores in the nose -nosebleed -signs and symptoms of infection like fever or chills; cough; sore throat -white patches or sores in the mouth or nose Side effects that  usually do not require medical attention (report to your doctor or health care professional if they continue or are bothersome): -burning or irritation inside the nose or throat -changes in taste or smell -cough -headache This list may  not describe all possible side effects. Call your doctor for medical advice about side effects. You may report side effects to FDA at 1-800-FDA-1088. Where should I keep my medicine? Keep out of the reach of children. Store at room temperature between 15 and 30 degrees C (59 and 86 degrees F). Avoid exposure to extreme heat, cold, or light. Throw away any unused medicine after the expiration date. NOTE: This sheet is a summary. It may not cover all possible information. If you have questions about this medicine, talk to your doctor, pharmacist, or health care provider.  2019 Elsevier/Gold Standard (2017-12-23 14:10:08) Cetirizine tablets What is this medicine? CETIRIZINE (se TI ra zeen) is an antihistamine. This medicine is used to treat or prevent symptoms of allergies. It is also used to help reduce itchy skin rash and hives. This medicine may be used for other purposes; ask your health care provider or pharmacist if you have questions. COMMON BRAND NAME(S): All Day Allergy, Allergy Relief, Zyrtec, Zyrtec Hives Relief What should I tell my health care provider before I take this medicine? They need to know if you have any of these conditions: -kidney disease -liver disease -an unusual or allergic reaction to cetirizine, hydroxyzine, other medicines, foods, dyes, or preservatives -pregnant or trying to get pregnant -breast-feeding How should I use this medicine? Take this medicine by mouth with a glass of water. Follow the directions on the prescription label. You can take this medicine with food or on an empty stomach. Take your medicine at regular times. Do not take more often than directed. You may need to take this medicine for several days before your symptoms improve. Talk to your pediatrician regarding the use of this medicine in children. Special care may be needed. While this drug may be prescribed for children as young as 76 years of age for selected conditions, precautions do apply.  Overdosage: If you think you have taken too much of this medicine contact a poison control center or emergency room at once. NOTE: This medicine is only for you. Do not share this medicine with others. What if I miss a dose? If you miss a dose, take it as soon as you can. If it is almost time for your next dose, take only that dose. Do not take double or extra doses. What may interact with this medicine? -alcohol -certain medicines for anxiety or sleep -narcotic medicines for pain -other medicines for colds or allergies This list may not describe all possible interactions. Give your health care provider a list of all the medicines, herbs, non-prescription drugs, or dietary supplements you use. Also tell them if you smoke, drink alcohol, or use illegal drugs. Some items may interact with your medicine. What should I watch for while using this medicine? Visit your doctor or health care professional for regular checks on your health. Tell your doctor if your symptoms do not improve. You may get drowsy or dizzy. Do not drive, use machinery, or do anything that needs mental alertness until you know how this medicine affects you. Do not stand or sit up quickly, especially if you are an older patient. This reduces the risk of dizzy or fainting spells. Your mouth may get dry. Chewing sugarless gum or sucking hard candy, and drinking plenty of  water may help. Contact your doctor if the problem does not go away or is severe. What side effects may I notice from receiving this medicine? Side effects that you should report to your doctor or health care professional as soon as possible: -allergic reactions like skin rash, itching or hives, swelling of the face, lips, or tongue -changes in vision or hearing -fast or irregular heartbeat -trouble passing urine or change in the amount of urine Side effects that usually do not require medical attention (report to your doctor or health care professional if they  continue or are bothersome): -dizziness -dry mouth -irritability -sore throat -stomach pain -tiredness This list may not describe all possible side effects. Call your doctor for medical advice about side effects. You may report side effects to FDA at 1-800-FDA-1088. Where should I keep my medicine? Keep out of the reach of children. Store at room temperature between 15 and 30 degrees C (59 and 86 degrees F). Throw away any unused medicine after the expiration date. NOTE: This sheet is a summary. It may not cover all possible information. If you have questions about this medicine, talk to your doctor, pharmacist, or health care provider.  2019 Elsevier/Gold Standard (2014-12-25 13:44:42) Allergies, Adult An allergy means that your body reacts to something that bothers it (allergen). It is not a normal reaction. This can happen from something that you:  Eat.  Breathe in.  Touch. You can have an allergy (be allergic) to:  Outdoor things, like: ? Pollen. ? Grass. ? Weeds.  Indoor things, like: ? Dust. ? Smoke. ? Pet dander.  Foods.  Medicines.  Things that bother your skin, like: ? Detergents. ? Chemicals. ? Latex.  Perfume.  Bugs. An allergy cannot spread from person to person (is not contagious). Follow these instructions at home:         Stay away from things that you know you are allergic to.  If you have allergies to things in the air, wash out your nose each day. Do it with one of these: ? A salt-water (saline) spray. ? A container (neti pot).  Take over-the-counter and prescription medicines only as told by your doctor.  Keep all follow-up visits as told by your doctor. This is important.  If you are at risk for a very bad allergy reaction (anaphylaxis), keep an auto-injector with you all the time. This is called an epinephrine injection. ? This is pre-measured medicine with a needle. You can put it into your skin by yourself. ? Right after you  have a very bad allergy reaction, you or a person with you must give the medicine in less than a few minutes. This is an emergency.  If you have ever had a very bad allergy reaction, wear a medical alert bracelet or necklace. Your very bad allergy should be written on it. Contact a health care provider if:  Your symptoms do not get better with treatment. Get help right away if:  You have symptoms of a very bad allergy reaction. These include: ? A swollen mouth, tongue, or throat. ? Pain or tightness in your chest. ? Trouble breathing. ? Being short of breath. ? Dizziness. ? Fainting. ? Very bad pain in your belly (abdomen). ? Throwing up (vomiting). ? Watery poop (diarrhea). Summary  An allergy means that your body reacts to something that bothers it (allergen). It is not a normal reaction.  Stay away from things that make your body react.  Take over-the-counter and prescription medicines only  as told by your doctor.  If you are at risk for a very bad allergy reaction, carry an auto-injector (epinephrine injection) all the time. Also, wear a medical alert bracelet or necklace so people know about your allergy. This information is not intended to replace advice given to you by your health care provider. Make sure you discuss any questions you have with your health care provider. Document Released: 03/27/2013 Document Revised: 03/15/2017 Document Reviewed: 03/15/2017 Elsevier Interactive Patient Education  2019 [place]. Asthma, Adult  Asthma is a long-term (chronic) condition in which the airways get tight and narrow. The airways are the breathing passages that lead from the nose and mouth down into the lungs. A person with asthma will have times when symptoms get worse. These are called asthma attacks. They can cause coughing, whistling sounds when you breathe (wheezing), shortness of breath, and chest pain. They can make it hard to breathe. There is no cure for asthma, but  medicines and lifestyle changes can help control it. There are many things that can bring on an asthma attack or make asthma symptoms worse (triggers). Common triggers include:  Mold.  Dust.  Cigarette smoke.  Cockroaches.  Things that can cause allergy symptoms (allergens). These include animal skin flakes (dander) and pollen from trees or grass.  Things that pollute the air. These may include household cleaners, wood smoke, smog, or chemical odors.  Cold air, weather changes, and wind.  Crying or laughing hard.  Stress.  Certain medicines or drugs.  Certain foods such as dried fruit, potato chips, and grape juice.  Infections, such as a cold or the flu.  Certain medical conditions or diseases.  Exercise or tiring activities. Asthma may be treated with medicines and by staying away from the things that cause asthma attacks. Types of medicines may include:  Controller medicines. These help prevent asthma symptoms. They are usually taken every day.  Fast-acting reliever or rescue medicines. These quickly relieve asthma symptoms. They are used as needed and provide short-term relief.  Allergy medicines if your attacks are brought on by allergens.  Medicines to help control the body's defense (immune) system. Follow these instructions at home: Avoiding triggers in your home  Change your heating and air conditioning filter often.  Limit your use of fireplaces and wood stoves.  Get rid of pests (such as roaches and mice) and their droppings.  Throw away plants if you see mold on them.  Clean your floors. Dust regularly. Use cleaning products that do not smell.  Have someone vacuum when you are not home. Use a vacuum cleaner with a HEPA filter if possible.  Replace carpet with wood, tile, or vinyl flooring. Carpet can trap animal skin flakes and dust.  Use allergy-proof pillows, mattress covers, and box spring covers.  Wash bed sheets and blankets every week in hot  water. Dry them in a dryer.  Keep your bedroom free of any triggers.  Avoid pets and keep windows closed when things that cause allergy symptoms are in the air.  Use blankets that are made of polyester or cotton.  Clean bathrooms and kitchens with bleach. If possible, have someone repaint the walls in these rooms with mold-resistant paint. Keep out of the rooms that are being cleaned and painted.  Wash your hands often with soap and water. If soap and water are not available, use hand sanitizer.  Do not allow anyone to smoke in your home. General instructions  Take over-the-counter and prescription medicines only as  told by your doctor. ? Talk with your doctor if you have questions about how or when to take your medicines. ? Make note if you need to use your medicines more often than usual.  Do not use any products that contain nicotine or tobacco, such as cigarettes and e-cigarettes. If you need help quitting, ask your doctor.  Stay away from secondhand smoke.  Avoid doing things outdoors when allergen counts are high and when air quality is low.  Wear a ski mask when doing outdoor activities in the winter. The mask should cover your nose and mouth. Exercise indoors on cold days if you can.  Warm up before you exercise. Take time to cool down after exercise.  Use a peak flow meter as told by your doctor. A peak flow meter is a tool that measures how well the lungs are working.  Keep track of the peak flow meter's readings. Write them down.  Follow your asthma action plan. This is a written plan for taking care of your asthma and treating your attacks.  Make sure you get all the shots (vaccines) that your doctor recommends. Ask your doctor about a flu shot and a pneumonia shot.  Keep all follow-up visits as told by your doctor. This is important. Contact a doctor if:  You have wheezing, shortness of breath, or a cough even while taking medicine to prevent attacks.  The  mucus you cough up (sputum) is thicker than usual.  The mucus you cough up changes from clear or white to yellow, green, gray, or bloody.  You have problems from the medicine you are taking, such as: ? A rash. ? Itching. ? Swelling. ? Trouble breathing.  You need reliever medicines more than 2-3 times a week.  Your peak flow reading is still at 50-79% of your personal best after following the action plan for 1 hour.  You have a fever. Get help right away if:  You seem to be worse and are not responding to medicine during an asthma attack.  You are short of breath even at rest.  You get short of breath when doing very little activity.  You have trouble eating, drinking, or talking.  You have chest pain or tightness.  You have a fast heartbeat.  Your lips or fingernails start to turn blue.  You are light-headed or dizzy, or you faint.  Your peak flow is less than 50% of your personal best.  You feel too tired to breathe normally. Summary  Asthma is a long-term (chronic) condition in which the airways get tight and narrow. An asthma attack can make it hard to breathe.  Asthma cannot be cured, but medicines and lifestyle changes can help control it.  Make sure you understand how to avoid triggers and how and when to use your medicines. This information is not intended to replace advice given to you by your health care provider. Make sure you discuss any questions you have with your health care provider. Document Released: 05/18/2008 Document Revised: 01/04/2017 Document Reviewed: 01/04/2017 Elsevier Interactive Patient Education  2019 [employer] Albuterol inhalation solution What is this medicine? ALBUTEROL (al Normajean [place]) is a bronchodilator. It helps to open up the airways in your lungs to make it easier to breathe. This medicine is used to treat and to prevent bronchospasm. This medicine may be used for other purposes; ask your health care provider or pharmacist  if you have questions. COMMON BRAND NAME(S): Accuneb, Proventil What should I tell my  health care provider before I take this medicine? They need to know if you have any of the following conditions: -diabetes -heart disease or irregular heartbeat -high blood pressure -pheochromocytoma -seizures -thyroid disease -an unusual or allergic reaction to albuterol, levalbuterol, sulfites, other medicines, foods, dyes, or preservatives -pregnant or trying to get pregnant -breast-feeding How should I use this medicine? This medicine is used in a nebulizer. Nebulizers make a liquid into an aerosol that you breathe in through your mouth or your mouth and nose into your lungs. You will be taught how to use your nebulizer. Follow the directions on your prescription label. Take your medicine at regular intervals. Do not use more often than directed. Talk to your pediatrician regarding the use of this medicine in children. Special care may be needed. Overdosage: If you think you have taken too much of this medicine contact a poison control center or emergency room at once. NOTE: This medicine is only for you. Do not share this medicine with others. What if I miss a dose? If you miss a dose, use it as soon as you can. If it is almost time for your next dose, use only that dose. Do not use double or extra doses. What may interact with this medicine? -anti-infectives like chloroquine and pentamidine -caffeine -cisapride -diuretics -medicines for colds -medicines for depression or emotional or psychotic conditions -medicines for weight loss including some herbal products -methadone -some antibiotics like clarithromycin, erythromycin, levofloxacin, and linezolid -some heart medicines -steroid hormones like dexamethasone, cortisone, hydrocortisone -theophylline -thyroid hormones This list may not describe all possible interactions. Give your health care provider a list of all the medicines, herbs,  non-prescription drugs, or dietary supplements you use. Also tell them if you smoke, drink alcohol, or use illegal drugs. Some items may interact with your medicine. What should I watch for while using this medicine? Tell your doctor or health care professional if your symptoms do not improve. Do not use extra albuterol. Call your doctor right away if your asthma or bronchitis gets worse while you are using this medicine. If your mouth gets dry try chewing sugarless gum or sucking hard candy. Drink water as directed. What side effects may I notice from receiving this medicine? Side effects that you should report to your doctor or health care professional as soon as possible: -allergic reactions like skin rash, itching or hives, swelling of the face, lips, or tongue -breathing problems -chest pain -feeling faint or lightheaded, falls -high blood pressure -irregular heartbeat -fever -muscle cramps or weakness -pain, tingling, numbness in the hands or feet -vomiting Side effects that usually do not require medical attention (report to your doctor or health care professional if they continue or are bothersome): -cough -difficulty sleeping -headache -nervousness, trembling -stomach upset -stuffy or runny nose -throat irritation -unusual taste This list may not describe all possible side effects. Call your doctor for medical advice about side effects. You may report side effects to FDA at 1-800-FDA-1088. Where should I keep my medicine? Keep out of the reach of children. Store between 2 and 25 degrees C (36 and 77 degrees F). Do not freeze. Protect from light. Throw away any unused medicine after the expiration date. Most products are kept in the foil package until time of use. Some products can be used up to 1 week after they are removed from the foil pouch. Check the instructions that come with your medicine. NOTE: This sheet is a summary. It may not cover all possible information. If  you  have questions about this medicine, talk to your doctor, pharmacist, or health care provider.  2019 Elsevier/Gold Standard (2011-08-21 15:19:55) Albuterol inhalation aerosol What is this medicine? ALBUTEROL (al Normajean [place]) is a bronchodilator. It helps open up the airways in your lungs to make it easier to breathe. This medicine is used to treat and to prevent bronchospasm. This medicine may be used for other purposes; ask your health care provider or pharmacist if you have questions. COMMON BRAND NAME(S): Proair HFA, Proventil, Proventil HFA, Respirol, Ventolin, Ventolin HFA What should I tell my health care provider before I take this medicine? They need to know if you have any of the following conditions: -diabetes -heart disease or irregular heartbeat -high blood pressure -pheochromocytoma -seizures -thyroid disease -an unusual or allergic reaction to albuterol, levalbuterol, sulfites, other medicines, foods, dyes, or preservatives -pregnant or trying to get pregnant -breast-feeding How should I use this medicine? This medicine is for inhalation through the mouth. Follow the directions on your prescription label. Take your medicine at regular intervals. Do not use more often than directed. Make sure that you are using your inhaler correctly. Ask you doctor or health care provider if you have any questions. Talk to your pediatrician regarding the use of this medicine in children. Special care may be needed. Overdosage: If you think you have taken too much of this medicine contact a poison control center or emergency room at once. NOTE: This medicine is only for you. Do not share this medicine with others. What if I miss a dose? If you miss a dose, use it as soon as you can. If it is almost time for your next dose, use only that dose. Do not use double or extra doses. What may interact with this medicine? -anti-infectives like chloroquine and pentamidine -caffeine -cisapride  -diuretics -medicines for colds -medicines for depression or for emotional or psychotic conditions -medicines for weight loss including some herbal products -methadone -some antibiotics like clarithromycin, erythromycin, levofloxacin, and linezolid -some heart medicines -steroid hormones like dexamethasone, cortisone, hydrocortisone -theophylline -thyroid hormones This list may not describe all possible interactions. Give your health care provider a list of all the medicines, herbs, non-prescription drugs, or dietary supplements you use. Also tell them if you smoke, drink alcohol, or use illegal drugs. Some items may interact with your medicine. What should I watch for while using this medicine? Tell your doctor or health care professional if your symptoms do not improve. Do not use extra albuterol. If your asthma or bronchitis gets worse while you are using this medicine, call your doctor right away. If your mouth gets dry try chewing sugarless gum or sucking hard candy. Drink water as directed. What side effects may I notice from receiving this medicine? Side effects that you should report to your doctor or health care professional as soon as possible: -allergic reactions like skin rash, itching or hives, swelling of the face, lips, or tongue -breathing problems -chest pain -feeling faint or lightheaded, falls -high blood pressure -irregular heartbeat -fever -muscle cramps or weakness -pain, tingling, numbness in the hands or feet -vomiting Side effects that usually do not require medical attention (report to your doctor or health care professional if they continue or are bothersome): -cough -difficulty sleeping -headache -nervousness or trembling -stomach upset -stuffy or runny nose -throat irritation -unusual taste This list may not describe all possible side effects. Call your doctor for medical advice about side effects. You may report side effects to FDA at 1-800-FDA-1088.  Where should I keep my medicine? Keep out of the reach of children. Store at room temperature between 15 and 30 degrees C (59 and 86 degrees F). The contents are under pressure and may burst when exposed to heat or flame. Do not freeze. This medicine does not work as well if it is too cold. Throw away any unused medicine after the expiration date. Inhalers need to be thrown away after the labeled number of puffs have been used or by the expiration date; whichever comes first. Ventolin HFA should be thrown away 12 months after removing from foil pouch. Check the instructions that come with your medicine. NOTE: This sheet is a summary. It may not cover all possible information. If you have questions about this medicine, talk to your doctor, pharmacist, or health care provider.  2019 Elsevier/Gold Standard (2013-05-18 10:57:17)

## 2019-03-06 NOTE — Progress Notes (Addendum)
[facility]   Sick Visit  Subjective:  Patient ID: Jasmin Stephenson, female    DOB: 1971/02/29  Age: 48 y.o. MRN: [id number]  CC:  Chief Complaint  Patient presents with  . Breast Mass    right elbow    HPI Jasmin Stephenson is a 48 year old female who presents for Sick Visit today.   Past Medical History:  Diagnosis Date  . Anxiety   . Arthritis    and tendonitis  . Asthma   . Breast calcification, right   . Cervical herniated disc   . Depression   . GERD (gastroesophageal reflux disease)   . Myocardial infarction ([place])    " Mild"   Current Status: Since her last office visit, she has c/o right elbow cyst since 2 weeks ago. She states that it feels 'numb.' She has not been using anything for relief of symptoms of increased swelling. She states that cyst in her right elbow is beginning to cause her discomfort. Her anxiety is mild today. She denies suicidal ideations, homicidal ideations, or auditory hallucinations.  She denies fevers, chills, fatigue, recent infections, weight loss, and night sweats. She has not had any headaches, visual changes, dizziness, and falls. No chest pain, heart palpitations, cough and shortness of breath reported. No reports of GI problems such as nausea, vomiting, diarrhea, and constipation. She has no reports of blood in stools, dysuria and hematuria. No depression or She denies pain today.   Past Surgical History:  Procedure Laterality Date  . ABDOMINAL SURGERY    . APPENDECTOMY    . BLADDER SURGERY    . CERVICAL DISC ARTHROPLASTY N/A 09/21/2016   Procedure: CERVICAL SIX- CERVICAL SEVEN ARTHROPLASTY;  Surgeon: Kevan Ny Ditty, MD;  Location: [facility];  Service: Neurosurgery;  Laterality: N/A;  C6-7 Arthroplasty  . IRRIGATION AND DEBRIDEMENT ABSCESS Right 09/10/2014   Procedure: IRRIGATION AND DEBRIDEMENT RIGHT BREAST  ABSCESS;  Surgeon: Gayland Curry, MD;  Location: [facility];  Service: General;   Laterality: Right;  . WISDOM TOOTH EXTRACTION      Family History  Problem Relation Age of Onset  . Diabetes Father   . Breast cancer Sister 82  . Alcohol abuse Neg Hx   . Arthritis Neg Hx   . Asthma Neg Hx   . Birth defects Neg Hx   . Cancer Neg Hx   . COPD Neg Hx   . Depression Neg Hx   . Drug abuse Neg Hx   . Early death Neg Hx   . Hearing loss Neg Hx   . Heart disease Neg Hx   . Hyperlipidemia Neg Hx   . Hypertension Neg Hx   . Kidney disease Neg Hx   . Learning disabilities Neg Hx   . Mental illness Neg Hx   . Mental retardation Neg Hx   . Miscarriages / Stillbirths Neg Hx   . Stroke Neg Hx   . Vision loss Neg Hx     Social History   Socioeconomic History  . Marital status: Single    Spouse name: Not on file  . Number of children: Not on file  . Years of education: Not on file  . Highest education level: Not on file  Occupational History  . Not on file  Social Needs  . Financial resource strain: Not on file  . Food insecurity:    Worry: Not on file    Inability: Not on file  .  Transportation needs:    Medical: Not on file    Non-medical: Not on file  Tobacco Use  . Smoking status: Current Every Day Smoker    Packs/day: 1.50    Years: 7.00    Pack years: 10.50    Types: Cigarettes  . Smokeless tobacco: Never Used  Substance and Sexual Activity  . Alcohol use: No  . Drug use: Yes    Types: Marijuana    Comment: 2-3 months ago  . Sexual activity: Yes    Birth control/protection: Surgical    Comment: last intercourse 27th Sep 2016  Lifestyle  . Physical activity:    Days per week: Not on file    Minutes per session: Not on file  . Stress: Not on file  Relationships  . Social connections:    Talks on phone: Not on file    Gets together: Not on file    Attends religious service: Not on file    Active member of club or organization: Not on file    Attends meetings of clubs or organizations: Not on file    Relationship status: Not on file  .  Intimate partner violence:    Fear of current or ex partner: Not on file    Emotionally abused: Not on file    Physically abused: Not on file    Forced sexual activity: Not on file  Other Topics Concern  . Not on file  Social History Narrative  . Not on file    Outpatient Medications Prior to Visit  Medication Sig Dispense Refill  . oxybutynin (DITROPAN) 5 MG tablet Take 1 tablet (5 mg total) by mouth 3 (three) times daily. (Patient not taking: Reported on 08/30/2018) 90 tablet 1  . albuterol (PROVENTIL HFA;VENTOLIN HFA) 108 (90 Base) MCG/ACT inhaler Inhale 2 puffs into the lungs every 4 (four) hours as needed for wheezing or shortness of breath (cough, shortness of breath or wheezing.). (Patient not taking: Reported on 12/13/2018) 1 Inhaler 1  . clotrimazole (LOTRIMIN AF) 1 % cream Apply 1 application topically 2 (two) times daily. (Patient not taking: Reported on 01/24/2019) 30 g 0  . fluconazole (DIFLUCAN) 150 MG tablet Take 1 tablet once every Tuesday X 4 weeks. (Weekly dosage) (Patient not taking: Reported on 03/06/2019) 4 tablet 0  . oxyCODONE-acetaminophen (PERCOCET) 10-325 MG tablet Take 1 tablet by mouth every 4 (four) hours as needed for pain. (Patient not taking: Reported on 08/30/2018) 20 tablet 0  . terbinafine (LAMISIL AT) 1 % cream Apply 1 application topically 2 (two) times daily. (Patient not taking: Reported on 01/24/2019) 30 g 1   No facility-administered medications prior to visit.     Allergies  Allergen Reactions  . Penicillins Nausea And Vomiting and Rash    Has patient had a PCN reaction causing immediate rash, facial/tongue/throat swelling, SOB or lightheadedness with hypotension: Yes Has patient had a PCN reaction causing severe rash involving mucus membranes or skin necrosis: Yes Has patient had a PCN reaction that required hospitalization No Has patient had a PCN reaction occurring within the last 10 years: No If all of the above answers are "NO", then may proceed  with Cephalosporin use.    ROS Review of Systems  Constitutional: Negative.   HENT: Negative.   Eyes: Negative.   Respiratory: Negative.   Cardiovascular: Negative.   Gastrointestinal: Negative.   Endocrine: Negative.   Genitourinary: Negative.   Musculoskeletal: Negative.   Skin:       Right elbow mass  Allergic/Immunologic: Negative.   Neurological: Negative.   Hematological: Negative.   Psychiatric/Behavioral: Negative.    Objective:    Physical Exam  Constitutional: She is oriented to person, place, and time. She appears well-developed and well-nourished.  HENT:  Head: Normocephalic and atraumatic.  Eyes: Conjunctivae are normal.  Neck: Normal range of motion. Neck supple.  Cardiovascular: Normal rate, regular rhythm, normal heart sounds and intact distal pulses.  Pulmonary/Chest: Effort normal and breath sounds normal.  Abdominal: Soft. Bowel sounds are normal.  Musculoskeletal: Normal range of motion.  Neurological: She is alert and oriented to person, place, and time. She has normal reflexes.  Skin: Skin is warm and dry.  Right elbow Olecranon Bursitis  Psychiatric: She has a normal mood and affect. Her behavior is normal. Judgment and thought content normal.  Nursing note and vitals reviewed.   BP (!) 144/80 (BP Location: Left Arm, Patient Position: Sitting, Cuff Size: Large)   Pulse 74   Temp 98 F (36.7 C) (Oral)   Ht 5\' 5"  (1.651 m)   Wt 217 lb (98.4 kg)   LMP 02/12/2019   SpO2 99%   BMI 36.11 kg/m  Wt Readings from Last 3 Encounters:  03/06/19 217 lb (98.4 kg)  01/24/19 212 lb (96.2 kg)  12/13/18 216 lb (98 kg)     There are no preventive care reminders to display for this patient.  There are no preventive care reminders to display for this patient.  Lab Results  Component Value Date   TSH 1.180 11/17/2017   Lab Results  Component Value Date   WBC 8.2 11/17/2017   HGB 11.7 11/17/2017   HCT 37.4 11/17/2017   MCV 86 11/17/2017   PLT 517  (H) 11/17/2017   Lab Results  Component Value Date   NA 142 11/17/2017   K 4.2 11/17/2017   CO2 22 11/17/2017   GLUCOSE 88 11/17/2017   BUN 4 (L) 11/17/2017   CREATININE 0.94 11/17/2017   BILITOT 0.3 11/17/2017   ALKPHOS 99 11/17/2017   AST 16 11/17/2017   ALT 11 11/17/2017   PROT 7.5 11/17/2017   ALBUMIN 4.3 11/17/2017   CALCIUM 9.5 11/17/2017   ANIONGAP 8 09/21/2016   Lab Results  Component Value Date   CHOL 167 11/17/2017   Lab Results  Component Value Date   HDL 46 11/17/2017   Lab Results  Component Value Date   LDLCALC 99 11/17/2017   Lab Results  Component Value Date   TRIG 112 11/17/2017   Lab Results  Component Value Date   CHOLHDL 3.6 11/17/2017   Lab Results  Component Value Date   HGBA1C 5.7 (A) 08/30/2018   Assessment & Plan:   1. Olecranon bursitis of right elbow       We will refer her to [facility] for further evaluation.  - AMB referral to orthopedics  2. Mild asthma without complication, unspecified whether persistent Stable today. No signs and symptoms of respiratory distress noted or reported.  - albuterol (PROVENTIL HFA;VENTOLIN HFA) 108 (90 Base) MCG/ACT inhaler; Inhale 2 puffs into the lungs every 4 (four) hours as needed for wheezing or shortness of breath (cough, shortness of breath or wheezing.).  Dispense: 1 Inhaler; Refill: 11 - albuterol (PROVENTIL) (2.5 MG/3ML) 0.083% nebulizer solution; Take 3 mLs (2.5 mg total) by nebulization every 6 (six) hours as needed for wheezing or shortness of breath.  Dispense: 75 mL; Refill: 12  3. Seasonal allergies We will initiate Flonase, Zyrtec, and Albuterol Nebs today.  -  fluticasone (FLONASE) 50 MCG/ACT nasal spray; Place 2 sprays into both nostrils daily.  Dispense: 16 g; Refill: 6 - cetirizine (ZYRTEC) 10 MG tablet; Take 1 tablet (10 mg total) by mouth daily.  Dispense: 30 tablet; Refill: 11 - albuterol (PROVENTIL) (2.5 MG/3ML) 0.083% nebulizer solution; Take 3 mLs (2.5 mg  total) by nebulization every 6 (six) hours as needed for wheezing or shortness of breath.  Dispense: 75 mL; Refill: 12  4. Healthcare maintenance - CBC with Differential - Comprehensive metabolic panel - TSH - Vitamin D, 25-hydroxy - Vitamin B12 - Uric Acid  5. Follow up She will follow up in 3 months. We will discuss Mammogram at next office visit.   Meds ordered this encounter  Medications  . albuterol (PROVENTIL HFA;VENTOLIN HFA) 108 (90 Base) MCG/ACT inhaler    Sig: Inhale 2 puffs into the lungs every 4 (four) hours as needed for wheezing or shortness of breath (cough, shortness of breath or wheezing.).    Dispense:  1 Inhaler    Refill:  11  . fluticasone (FLONASE) 50 MCG/ACT nasal spray    Sig: Place 2 sprays into both nostrils daily.    Dispense:  16 g    Refill:  6  . cetirizine (ZYRTEC) 10 MG tablet    Sig: Take 1 tablet (10 mg total) by mouth daily.    Dispense:  30 tablet    Refill:  11  . albuterol (PROVENTIL) (2.5 MG/3ML) 0.083% nebulizer solution    Sig: Take 3 mLs (2.5 mg total) by nebulization every 6 (six) hours as needed for wheezing or shortness of breath.    Dispense:  75 mL    Refill:  12    Orders Placed This Encounter  Procedures  . CBC with Differential  . Comprehensive metabolic panel  . TSH  . Vitamin D, 25-hydroxy  . Vitamin B12  . Uric Acid  . AMB referral to orthopedics     Referral Orders     AMB referral to orthopedics  Problem List Items Addressed This Visit    None    Visit Diagnoses    Olecranon bursitis of right elbow    -  Primary   Relevant Orders   AMB referral to orthopedics   Mild asthma without complication, unspecified whether persistent       Relevant Medications   albuterol (PROVENTIL HFA;VENTOLIN HFA) 108 (90 Base) MCG/ACT inhaler   albuterol (PROVENTIL) (2.5 MG/3ML) 0.083% nebulizer solution   Seasonal allergies       Relevant Medications   fluticasone (FLONASE) 50 MCG/ACT nasal spray   cetirizine (ZYRTEC) 10  MG tablet   albuterol (PROVENTIL) (2.5 MG/3ML) 0.083% nebulizer solution   Healthcare maintenance       Relevant Orders   CBC with Differential   Comprehensive metabolic panel   TSH   Vitamin D, 25-hydroxy   Vitamin B12   Uric Acid   Follow up          Meds ordered this encounter  Medications  . albuterol (PROVENTIL HFA;VENTOLIN HFA) 108 (90 Base) MCG/ACT inhaler    Sig: Inhale 2 puffs into the lungs every 4 (four) hours as needed for wheezing or shortness of breath (cough, shortness of breath or wheezing.).    Dispense:  1 Inhaler    Refill:  11  . fluticasone (FLONASE) 50 MCG/ACT nasal spray    Sig: Place 2 sprays into both nostrils daily.    Dispense:  16 g    Refill:  6  .  cetirizine (ZYRTEC) 10 MG tablet    Sig: Take 1 tablet (10 mg total) by mouth daily.    Dispense:  30 tablet    Refill:  11  . albuterol (PROVENTIL) (2.5 MG/3ML) 0.083% nebulizer solution    Sig: Take 3 mLs (2.5 mg total) by nebulization every 6 (six) hours as needed for wheezing or shortness of breath.    Dispense:  75 mL    Refill:  12    Follow-up: No follow-ups on file.    [name], FNP

## 2019-03-07 LAB — COMPREHENSIVE METABOLIC PANEL
ALT: 8 IU/L (ref 0–32)
AST: 14 IU/L (ref 0–40)
Albumin/Globulin Ratio: 1.8 (ref 1.2–2.2)
Albumin: 4.3 g/dL (ref 3.8–4.8)
Alkaline Phosphatase: 113 IU/L (ref 39–117)
BUN/Creatinine Ratio: 7 — ABNORMAL LOW (ref 9–23)
BUN: 7 mg/dL (ref 6–24)
Bilirubin Total: 0.2 mg/dL (ref 0.0–1.2)
CO2: 23 mmol/L (ref 20–29)
Calcium: 9.3 mg/dL (ref 8.7–10.2)
Chloride: 103 mmol/L (ref 96–106)
Creatinine, Ser: 1.03 mg/dL — ABNORMAL HIGH (ref 0.57–1.00)
GFR calc Af Amer: 75 mL/min/{1.73_m2} (ref >59)
GFR calc non Af Amer: 65 mL/min/{1.73_m2} (ref >59)
Globulin, Total: 2.4 g/dL (ref 1.5–4.5)
Glucose: 108 mg/dL — ABNORMAL HIGH (ref 65–99)
Potassium: 4 mmol/L (ref 3.5–5.2)
Sodium: 140 mmol/L (ref 134–144)
Total Protein: 6.7 g/dL (ref 6.0–8.5)

## 2019-03-07 LAB — CBC WITH DIFFERENTIAL/PLATELET
Basophils Absolute: 0 10*3/uL (ref 0.0–0.2)
Basos: 0 %
EOS (ABSOLUTE): 0.2 10*3/uL (ref 0.0–0.4)
Eos: 2 %
Hematocrit: 31.8 % — ABNORMAL LOW (ref 34.0–46.6)
Hemoglobin: 9.3 g/dL — ABNORMAL LOW (ref 11.1–15.9)
Immature Grans (Abs): 0 10*3/uL (ref 0.0–0.1)
Immature Granulocytes: 0 %
Lymphocytes Absolute: 3 10*3/uL (ref 0.7–3.1)
Lymphs: 33 %
MCH: 21 pg — ABNORMAL LOW (ref 26.6–33.0)
MCHC: 29.2 g/dL — ABNORMAL LOW (ref 31.5–35.7)
MCV: 72 fL — ABNORMAL LOW (ref 79–97)
Monocytes Absolute: 1 10*3/uL — ABNORMAL HIGH (ref 0.1–0.9)
Monocytes: 11 %
Neutrophils Absolute: 4.7 10*3/uL (ref 1.4–7.0)
Neutrophils: 54 %
Platelets: 584 10*3/uL — ABNORMAL HIGH (ref 150–450)
RBC: 4.42 x10E6/uL (ref 3.77–5.28)
RDW: 17.8 % — ABNORMAL HIGH (ref 11.7–15.4)
WBC: 9 10*3/uL (ref 3.4–10.8)

## 2019-03-07 LAB — VITAMIN D 25 HYDROXY (VIT D DEFICIENCY, FRACTURES): Vit D, 25-Hydroxy: 6.4 ng/mL — ABNORMAL LOW (ref 30.0–100.0)

## 2019-03-07 LAB — URIC ACID: Uric Acid: 4.8 mg/dL (ref 2.5–7.1)

## 2019-03-07 LAB — TSH: TSH: 2.74 u[IU]/mL (ref 0.450–4.500)

## 2019-03-07 LAB — VITAMIN B12: Vitamin B-12: 472 pg/mL (ref 232–1245)

## 2019-03-08 DIAGNOSIS — F331 Major depressive disorder, recurrent, moderate: Secondary | ICD-10-CM | POA: Diagnosis not present

## 2019-03-12 ENCOUNTER — Other Ambulatory Visit: Payer: Self-pay | Admitting: Family Medicine

## 2019-03-12 ENCOUNTER — Encounter: Payer: Self-pay | Admitting: Family Medicine

## 2019-03-12 DIAGNOSIS — E559 Vitamin D deficiency, unspecified: Secondary | ICD-10-CM

## 2019-03-12 DIAGNOSIS — D649 Anemia, unspecified: Secondary | ICD-10-CM

## 2019-03-12 MED ORDER — FERROUS SULFATE 325 (65 FE) MG PO TABS: 325.0000 mg | ORAL_TABLET | Freq: Every day | ORAL | 3 refills | Status: DC

## 2019-03-12 MED ORDER — VITAMIN D (ERGOCALCIFEROL) 1.25 MG (50000 UNIT) PO CAPS: 50000.0000 [IU] | ORAL_CAPSULE | ORAL | 3 refills | Status: DC

## 2019-03-15 ENCOUNTER — Other Ambulatory Visit: Payer: Self-pay | Admitting: Family Medicine

## 2019-03-15 ENCOUNTER — Telehealth: Payer: Self-pay

## 2019-03-15 DIAGNOSIS — J45909 Unspecified asthma, uncomplicated: Secondary | ICD-10-CM

## 2019-03-15 MED ORDER — NEBULIZER/TUBING/MOUTHPIECE KIT: 1.0000 | PACK | 0 refills | Status: DC | PRN

## 2019-03-15 NOTE — Telephone Encounter (Signed)
Patient needs a script for a nebulizer machine. Patient wants script sent to [facility]

## 2019-03-16 NOTE — Telephone Encounter (Signed)
Message left for patient to return call.

## 2019-03-16 NOTE — Telephone Encounter (Signed)
Message delivered to patient.

## 2019-03-17 DIAGNOSIS — M25521 Pain in right elbow: Secondary | ICD-10-CM | POA: Diagnosis not present

## 2019-05-18 DIAGNOSIS — R1011 Right upper quadrant pain: Secondary | ICD-10-CM | POA: Diagnosis not present

## 2019-05-18 DIAGNOSIS — R12 Heartburn: Secondary | ICD-10-CM | POA: Diagnosis not present

## 2019-05-23 ENCOUNTER — Other Ambulatory Visit: Payer: Self-pay | Admitting: Gastroenterology

## 2019-05-23 DIAGNOSIS — R1011 Right upper quadrant pain: Secondary | ICD-10-CM

## 2019-05-24 DIAGNOSIS — F331 Major depressive disorder, recurrent, moderate: Secondary | ICD-10-CM | POA: Diagnosis not present

## 2019-06-02 ENCOUNTER — Ambulatory Visit
Admission: RE | Admit: 2019-06-02 | Discharge: 2019-06-02 | Disposition: A | Discharge status: Home / Self Care | Payer: Medicaid Other | Source: Ambulatory Visit | Attending: Gastroenterology | Admitting: Gastroenterology

## 2019-06-02 ENCOUNTER — Other Ambulatory Visit: Payer: Medicaid Other

## 2019-06-02 DIAGNOSIS — R1011 Right upper quadrant pain: Secondary | ICD-10-CM

## 2019-06-02 DIAGNOSIS — K760 Fatty (change of) liver, not elsewhere classified: Secondary | ICD-10-CM | POA: Diagnosis not present

## 2019-06-13 ENCOUNTER — Ambulatory Visit: Payer: Medicaid Other | Admitting: Family Medicine

## 2019-06-20 DIAGNOSIS — R1011 Right upper quadrant pain: Secondary | ICD-10-CM | POA: Diagnosis not present

## 2019-07-03 ENCOUNTER — Encounter: Payer: Self-pay | Admitting: Family Medicine

## 2019-07-03 ENCOUNTER — Other Ambulatory Visit: Payer: Self-pay

## 2019-07-03 ENCOUNTER — Ambulatory Visit (INDEPENDENT_AMBULATORY_CARE_PROVIDER_SITE_OTHER): Payer: Medicaid Other | Admitting: Family Medicine

## 2019-07-03 VITALS — BP 126/84 | HR 84 | Temp 98.8°F | Ht 65.0 in | Wt 216.0 lb

## 2019-07-03 DIAGNOSIS — R7303 Prediabetes: Secondary | ICD-10-CM | POA: Diagnosis not present

## 2019-07-03 DIAGNOSIS — H539 Unspecified visual disturbance: Secondary | ICD-10-CM

## 2019-07-03 DIAGNOSIS — Z131 Encounter for screening for diabetes mellitus: Secondary | ICD-10-CM | POA: Diagnosis not present

## 2019-07-03 DIAGNOSIS — L299 Pruritus, unspecified: Secondary | ICD-10-CM | POA: Diagnosis not present

## 2019-07-03 DIAGNOSIS — M7021 Olecranon bursitis, right elbow: Secondary | ICD-10-CM

## 2019-07-03 DIAGNOSIS — R1084 Generalized abdominal pain: Secondary | ICD-10-CM

## 2019-07-03 DIAGNOSIS — Z09 Encounter for follow-up examination after completed treatment for conditions other than malignant neoplasm: Secondary | ICD-10-CM | POA: Diagnosis not present

## 2019-07-03 DIAGNOSIS — J45909 Unspecified asthma, uncomplicated: Secondary | ICD-10-CM

## 2019-07-03 LAB — POCT URINALYSIS DIP (MANUAL ENTRY)
Bilirubin, UA: NEGATIVE
Blood, UA: NEGATIVE
Glucose, UA: NEGATIVE mg/dL
Ketones, POC UA: NEGATIVE mg/dL
Leukocytes, UA: NEGATIVE
Nitrite, UA: NEGATIVE
Protein Ur, POC: NEGATIVE mg/dL
Spec Grav, UA: 1.01 (ref 1.010–1.025)
Urobilinogen, UA: 0.2 E.U./dL
pH, UA: 5.5 (ref 5.0–8.0)

## 2019-07-03 LAB — POCT GLYCOSYLATED HEMOGLOBIN (HGB A1C): Hemoglobin A1C: 6.3 % — AB (ref 4.0–5.6)

## 2019-07-03 MED ORDER — HYDROXYZINE HCL 10 MG PO TABS: 10.0000 mg | ORAL_TABLET | Freq: Three times a day (TID) | ORAL | 3 refills | Status: AC | PRN

## 2019-07-03 NOTE — Patient Instructions (Signed)
Pruritus Pruritus is an itchy feeling on the skin. One of the most common causes is dry skin, but many different things can cause itching. Most cases of itching do not require medical attention. Sometimes itchy skin can turn into a rash. Follow these instructions at home: Skin care   Apply moisturizing lotion to your skin as needed. Lotion that contains petroleum jelly is best.  Take medicines or apply medicated creams only as told by your health care provider. This may include: ? Corticosteroid cream. ? Anti-itch lotions. ? Oral antihistamines.  Apply a cool, wet cloth (cool compress) to the affected areas.  Take baths with one of the following: ? Epsom salts. You can get these at your local pharmacy or grocery store. Follow the instructions on the packaging. ? Baking soda. Pour a small amount into the bath as told by your health care provider. ? Colloidal oatmeal. You can get this at your local pharmacy or grocery store. Follow the instructions on the packaging.  Apply baking soda paste to your skin. To make the paste, stir water into a small amount of baking soda until it reaches a paste-like consistency.  Do not scratch your skin.  Do not take hot showers or baths, which can make itching worse. A cool shower may help with itching as long as you apply moisturizing lotion after the shower.  Do not use scented soaps, detergents, perfumes, and cosmetic products. Instead, use gentle, unscented versions of these items. General instructions  Avoid wearing tight clothes.  Keep a journal to help find out what is causing your itching. Write down: ? What you eat and drink. ? What cosmetic products you use. ? What soaps or detergents you use. ? What you wear, including jewelry.  Use a humidifier. This keeps the air moist, which helps to prevent dry skin.  Be aware of any changes in your itchiness. Contact a health care provider if:  The itching does not go away after several days.   You are unusually thirsty or urinating more than normal.  Your skin tingles or feels numb.  Your skin or the white parts of your eyes turn yellow (jaundice).  You feel weak.  You have any of the following: ? Night sweats. ? Tiredness (fatigue). ? Weight loss. ? Abdominal pain. Summary  Pruritus is an itchy feeling on the skin. One of the most common causes is dry skin, but many different conditions and factors can cause itching.  Apply moisturizing lotion to your skin as needed. Lotion that contains petroleum jelly is best.  Take medicines or apply medicated creams only as told by your health care provider.  Do not take hot showers or baths. Do not use scented soaps, detergents, perfumes, or cosmetic products. This information is not intended to replace advice given to you by your health care provider. Make sure you discuss any questions you have with your health care provider. Document Released: 08/12/2011 Document Revised: 12/14/2017 Document Reviewed: 12/14/2017 Elsevier Patient Education  2020 [place]. Hydroxyzine capsules or tablets What is this medicine? HYDROXYZINE (hye Stanley i zeen) is an antihistamine. This medicine is used to treat allergy symptoms. It is also used to treat anxiety and tension. This medicine can be used with other medicines to induce sleep before surgery. This medicine may be used for other purposes; ask your health care provider or pharmacist if you have questions. COMMON BRAND NAME(S): ANX, Atarax, Rezine, Vistaril What should I tell my health care provider before I take this medicine?  They need to know if you have any of these conditions:  glaucoma  heart disease  history of irregular heartbeat  kidney disease  liver disease  lung or breathing disease, like asthma  stomach or intestine problems  thyroid disease  trouble passing urine  an unusual or allergic reaction to hydroxyzine, cetirizine, other medicines, foods, dyes or  preservatives  pregnant or trying to get pregnant  breast-feeding How should I use this medicine? Take this medicine by mouth with a full glass of water. Follow the directions on the prescription label. You may take this medicine with food or on an empty stomach. Take your medicine at regular intervals. Do not take your medicine more often than directed. Talk to your pediatrician regarding the use of this medicine in children. Special care may be needed. While this drug may be prescribed for children as young as 19 years of age for selected conditions, precautions do apply. Patients over 3 years old may have a stronger reaction and need a smaller dose. Overdosage: If you think you have taken too much of this medicine contact a poison control center or emergency room at once. NOTE: This medicine is only for you. Do not share this medicine with others. What if I miss a dose? If you miss a dose, take it as soon as you can. If it is almost time for your next dose, take only that dose. Do not take double or extra doses. What may interact with this medicine? Do not take this medicine with any of the following medications:  cisapride  dronedarone  pimozide  thioridazine This medicine may also interact with the following medications:  alcohol  antihistamines for allergy, cough, and cold  atropine  barbiturate medicines for sleep or seizures, like phenobarbital  certain antibiotics like erythromycin or clarithromycin  certain medicines for anxiety or sleep  certain medicines for bladder problems like oxybutynin, tolterodine  certain medicines for depression or psychotic disturbances  certain medicines for irregular heart beat  certain medicines for Parkinson's disease like benztropine, trihexyphenidyl  certain medicines for seizures like phenobarbital, primidone  certain medicines for stomach problems like dicyclomine, hyoscyamine  certain medicines for travel sickness like  scopolamine  ipratropium  narcotic medicines for pain  other medicines that prolong the QT interval (an abnormal heart rhythm) like dofetilide This list may not describe all possible interactions. Give your health care provider a list of all the medicines, herbs, non-prescription drugs, or dietary supplements you use. Also tell them if you smoke, drink alcohol, or use illegal drugs. Some items may interact with your medicine. What should I watch for while using this medicine? Tell your doctor or health care professional if your symptoms do not improve. You may get drowsy or dizzy. Do not drive, use machinery, or do anything that needs mental alertness until you know how this medicine affects you. Do not stand or sit up quickly, especially if you are an older patient. This reduces the risk of dizzy or fainting spells. Alcohol may interfere with the effect of this medicine. Avoid alcoholic drinks. Your mouth may get dry. Chewing sugarless gum or sucking hard candy, and drinking plenty of water may help. Contact your doctor if the problem does not go away or is severe. This medicine may cause dry eyes and blurred vision. If you wear contact lenses you may feel some discomfort. Lubricating drops may help. See your eye doctor if the problem does not go away or is severe. If you are receiving skin  tests for allergies, tell your doctor you are using this medicine. What side effects may I notice from receiving this medicine? Side effects that you should report to your doctor or health care professional as soon as possible:  allergic reactions like skin rash, itching or hives, swelling of the face, lips, or tongue  changes in vision  confusion  fast, irregular heartbeat  seizures  tremor  trouble passing urine or change in the amount of urine Side effects that usually do not require medical attention (report to your doctor or health care professional if they continue or are bothersome):   constipation  drowsiness  dry mouth  headache  tiredness This list may not describe all possible side effects. Call your doctor for medical advice about side effects. You may report side effects to FDA at 1-800-FDA-1088. Where should I keep my medicine? Keep out of the reach of children. Store at room temperature between 15 and 30 degrees C (59 and 86 degrees F). Keep container tightly closed. Throw away any unused medicine after the expiration date. NOTE: This sheet is a summary. It may not cover all possible information. If you have questions about this medicine, talk to your doctor, pharmacist, or health care provider.  2020 Elsevier/Gold Standard (2018-11-21 13:19:55)

## 2019-07-03 NOTE — Progress Notes (Signed)
Patient [facility]   Established Patient Office Visit  Subjective:  Patient ID: Jasmin Stephenson, female    DOB: [birth year]  Age: 48 y.o. MRN: [id number]  CC:  Chief Complaint  Patient presents with   Follow-up    chronic condtiion     HPI Jasmin Stephenson is a 48 year old female who presents for follow up today.   Past Medical History:  Diagnosis Date   Anemia    Anxiety    Arthritis    and tendonitis   Asthma    Breast calcification, right    Cervical herniated disc    Depression    GERD (gastroesophageal reflux disease)    Myocardial infarction ([place])    " Mild"   Vitamin D deficiency    Current Status: Since her last office visit, she is doing well with no complaints. She recent had abdominal surgery in 02/2019, and continues to have abdominal discomfort since surgery, which she reports is not improving. She continues to follow up with GI for abdominal pain. She was recently initiated on new medication on this past Saturday. She denies nausea, vomiting, diarrhea, and constipation. She has no reports of blood in stools, dysuria and hematuria.Her anxiety is mild today. She denies suicidal ideations, homicidal ideations, or auditory hallucinations.  She denies fevers, chills, fatigue, recent infections, weight loss, and night sweats. She has not had any headaches, visual changes, dizziness, and falls. No chest pain, heart palpitations, cough and shortness of breath reported.  She denies pain today.   Past Surgical History:  Procedure Laterality Date   ABDOMINAL SURGERY     APPENDECTOMY     BLADDER SURGERY     CERVICAL DISC ARTHROPLASTY N/A 09/21/2016   Procedure: CERVICAL SIX- CERVICAL SEVEN ARTHROPLASTY;  Surgeon: Kevan Ny Ditty, MD;  Location: [facility];  Service: Neurosurgery;  Laterality: N/A;  C6-7 Arthroplasty   IRRIGATION AND DEBRIDEMENT ABSCESS Right 09/10/2014   Procedure: IRRIGATION AND DEBRIDEMENT RIGHT  BREAST  ABSCESS;  Surgeon: Gayland Curry, MD;  Location: [facility];  Service: General;  Laterality: Right;   WISDOM TOOTH EXTRACTION      Family History  Problem Relation Age of Onset   Diabetes Father    Breast cancer Sister 68   Alcohol abuse Neg Hx    Arthritis Neg Hx    Asthma Neg Hx    Birth defects Neg Hx    Cancer Neg Hx    COPD Neg Hx    Depression Neg Hx    Drug abuse Neg Hx    Early death Neg Hx    Hearing loss Neg Hx    Heart disease Neg Hx    Hyperlipidemia Neg Hx    Hypertension Neg Hx    Kidney disease Neg Hx    Learning disabilities Neg Hx    Mental illness Neg Hx    Mental retardation Neg Hx    Miscarriages / Stillbirths Neg Hx    Stroke Neg Hx    Vision loss Neg Hx     Social History   Socioeconomic History   Marital status: Single    Spouse name: Not on file   Number of children: Not on file   Years of education: Not on file   Highest education level: Not on file  Occupational History   Not on file  Social Needs   Financial resource strain: Not on file   Food insecurity    Worry: Not  on file    Inability: Not on file   Transportation needs    Medical: Not on file    Non-medical: Not on file  Tobacco Use   Smoking status: Current Every Day Smoker    Packs/day: 1.50    Years: 7.00    Pack years: 10.50    Types: Cigarettes   Smokeless tobacco: Never Used  Substance and Sexual Activity   Alcohol use: No   Drug use: Yes    Types: Marijuana    Comment: 2-3 months ago   Sexual activity: Yes    Birth control/protection: Surgical    Comment: last intercourse 27th Sep 2016  Lifestyle   Physical activity    Days per week: Not on file    Minutes per session: Not on file   Stress: Not on file  Relationships   Social connections    Talks on phone: Not on file    Gets together: Not on file    Attends religious service: Not on file    Active member of club or organization: Not on file    Attends meetings  of clubs or organizations: Not on file    Relationship status: Not on file   Intimate partner violence    Fear of current or ex partner: Not on file    Emotionally abused: Not on file    Physically abused: Not on file    Forced sexual activity: Not on file  Other Topics Concern   Not on file  Social History Narrative   Not on file    Outpatient Medications Prior to Visit  Medication Sig Dispense Refill   albuterol (PROVENTIL HFA;VENTOLIN HFA) 108 (90 Base) MCG/ACT inhaler Inhale 2 puffs into the lungs every 4 (four) hours as needed for wheezing or shortness of breath (cough, shortness of breath or wheezing.). 1 Inhaler 11   albuterol (PROVENTIL) (2.5 MG/3ML) 0.083% nebulizer solution Take 3 mLs (2.5 mg total) by nebulization every 6 (six) hours as needed for wheezing or shortness of breath. 75 mL 12   ferrous sulfate 325 (65 FE) MG tablet Take 1 tablet (325 mg total) by mouth daily. 30 tablet 3   ibuprofen (ADVIL) 800 MG tablet Take 800 mg by mouth every 6 (six) hours as needed. for pain     cetirizine (ZYRTEC) 10 MG tablet Take 1 tablet (10 mg total) by mouth daily. (Patient not taking: Reported on 07/03/2019) 30 tablet 11   fluticasone (FLONASE) 50 MCG/ACT nasal spray Place 2 sprays into both nostrils daily. (Patient not taking: Reported on 07/03/2019) 16 g 6   oxybutynin (DITROPAN) 5 MG tablet Take 1 tablet (5 mg total) by mouth 3 (three) times daily. (Patient not taking: Reported on 08/30/2018) 90 tablet 1   Respiratory Therapy Supplies (NEBULIZER/TUBING/MOUTHPIECE) KIT 1 each by Does not apply route as needed. 1 each 0   Vitamin D, Ergocalciferol, (DRISDOL) 1.25 MG (50000 UT) CAPS capsule Take 1 capsule (50,000 Units total) by mouth every 7 (seven) days. (Patient not taking: Reported on 07/03/2019) 5 capsule 3   sucralfate (CARAFATE) 1 g tablet Take 1 g by mouth 2 (two) times a day.     traMADol (ULTRAM) 50 MG tablet Take 50 mg by mouth every 6 (six) hours as needed. for pain      No facility-administered medications prior to visit.     Allergies  Allergen Reactions   Penicillins Nausea And Vomiting and Rash    Has patient had a PCN reaction causing immediate rash,  facial/tongue/throat swelling, SOB or lightheadedness with hypotension: Yes Has patient had a PCN reaction causing severe rash involving mucus membranes or skin necrosis: Yes Has patient had a PCN reaction that required hospitalization No Has patient had a PCN reaction occurring within the last 10 years: No If all of the above answers are "NO", then may proceed with Cephalosporin use.    ROS Review of Systems  Constitutional: Negative.   HENT: Negative.   Eyes: Negative.   Respiratory: Negative.   Cardiovascular: Negative.   Gastrointestinal: Positive for abdominal pain.  Endocrine: Negative.   Genitourinary: Negative.   Musculoskeletal: Negative.   Skin: Negative.   Allergic/Immunologic: Negative.   Neurological: Negative.   Hematological: Negative.   Psychiatric/Behavioral: Negative.     Objective:    Physical Exam  Constitutional: She is oriented to person, place, and time. She appears well-developed and well-nourished.  HENT:  Head: Normocephalic and atraumatic.  Eyes: Conjunctivae are normal.  Neck: Normal range of motion. Neck supple.  Cardiovascular: Normal rate, regular rhythm, normal heart sounds and intact distal pulses.  Pulmonary/Chest: Effort normal and breath sounds normal.  Abdominal: Soft. Bowel sounds are normal.  Musculoskeletal: Normal range of motion.  Neurological: She is alert and oriented to person, place, and time.  Skin: Skin is warm and dry.  Psychiatric: She has a normal mood and affect. Her behavior is normal. Judgment and thought content normal.  Nursing note and vitals reviewed.   BP 126/84 (BP Location: Left Arm, Patient Position: Sitting, Cuff Size: Large)    Pulse 84    Temp 98.8 F (37.1 C) (Oral)    Ht _0  (1.651 m)    Wt 216 lb (98 kg)     LMP 06/09/2019    SpO2 99%    BMI 35.94 kg/m  Wt Readings from Last 3 Encounters:  07/03/19 216 lb (98 kg)  03/06/19 217 lb (98.4 kg)  01/24/19 212 lb (96.2 kg)     There are no preventive care reminders to display for this patient.  There are no preventive care reminders to display for this patient.  Lab Results  Component Value Date   TSH 2.740 03/06/2019   Lab Results  Component Value Date   WBC 9.0 03/06/2019   HGB 9.3 (L) 03/06/2019   HCT 31.8 (L) 03/06/2019   MCV 72 (L) 03/06/2019   PLT 584 (H) 03/06/2019   Lab Results  Component Value Date   NA 140 03/06/2019   K 4.0 03/06/2019   CO2 23 03/06/2019   GLUCOSE 108 (H) 03/06/2019   BUN 7 03/06/2019   CREATININE 1.03 (H) 03/06/2019   BILITOT 0.2 03/06/2019   ALKPHOS 113 03/06/2019   AST 14 03/06/2019   ALT 8 03/06/2019   PROT 6.7 03/06/2019   ALBUMIN 4.3 03/06/2019   CALCIUM 9.3 03/06/2019   ANIONGAP 8 09/21/2016   Lab Results  Component Value Date   CHOL 167 11/17/2017   Lab Results  Component Value Date   HDL 46 11/17/2017   Lab Results  Component Value Date   LDLCALC 99 11/17/2017   Lab Results  Component Value Date   TRIG 112 11/17/2017   Lab Results  Component Value Date   CHOLHDL 3.6 11/17/2017   Lab Results  Component Value Date   HGBA1C 6.3 (A) 07/03/2019   Assessment & Plan:   1. Mild asthma without complication, unspecified whether persistent Stable. No signs or symptoms of respiratory distress noted or reported.  2. Olecranon bursitis of right  elbow Stable.   3. Generalized abdominal pain  4. Itching We will initiate Hydroxyzine today.  - hydrOXYzine (ATARAX/VISTARIL) 10 MG tablet; Take 1 tablet (10 mg total) by mouth 3 (three) times daily as needed.  Dispense: 30 tablet; Refill: 3  5. Vision disturbance - Ambulatory referral to Ophthalmology  6. Prediabetes  7. Screening for diabetes mellitus Hgb A1c is stable at 6.3 today. She will continue to decrease  foods/beverages high in sugars and carbs and follow Heart Healthy or DASH diet. Increase physical activity to at least 30 minutes cardio exercise daily.  - POCT glycosylated hemoglobin (Hb A1C) - POCT urinalysis dipstick  8. Follow up She will follow up in 6 months.   Meds ordered this encounter  Medications   hydrOXYzine (ATARAX/VISTARIL) 10 MG tablet    Sig: Take 1 tablet (10 mg total) by mouth 3 (three) times daily as needed.    Dispense:  30 tablet    Refill:  3    Orders Placed This Encounter  Procedures   Ambulatory referral to Ophthalmology   POCT glycosylated hemoglobin (Hb A1C)   POCT urinalysis dipstick     Referral Orders     Ambulatory referral to Ophthalmology   Jasmin Becton,  MSN, FNP-BC [address] [place], [place] [address] [phone] fax   Problem List Items Addressed This Visit    None    Visit Diagnoses    Mild asthma without complication, unspecified whether persistent    -  Primary   Olecranon bursitis of right elbow       Generalized abdominal pain       Itching       Relevant Medications   hydrOXYzine (ATARAX/VISTARIL) 10 MG tablet   Vision disturbance       Relevant Orders   Ambulatory referral to Ophthalmology   Prediabetes       Screening for diabetes mellitus       Relevant Orders   POCT glycosylated hemoglobin (Hb A1C) (Completed)   POCT urinalysis dipstick (Completed)   Follow up          Meds ordered this encounter  Medications   hydrOXYzine (ATARAX/VISTARIL) 10 MG tablet    Sig: Take 1 tablet (10 mg total) by mouth 3 (three) times daily as needed.    Dispense:  30 tablet    Refill:  3    Follow-up: Return in about 6 months (around 01/03/2020).    [name], FNP

## 2019-08-02 DIAGNOSIS — H35013 Changes in retinal vascular appearance, bilateral: Secondary | ICD-10-CM | POA: Diagnosis not present

## 2019-08-02 DIAGNOSIS — H35033 Hypertensive retinopathy, bilateral: Secondary | ICD-10-CM | POA: Diagnosis not present

## 2019-08-02 DIAGNOSIS — H04123 Dry eye syndrome of bilateral lacrimal glands: Secondary | ICD-10-CM | POA: Diagnosis not present

## 2019-08-03 ENCOUNTER — Ambulatory Visit (INDEPENDENT_AMBULATORY_CARE_PROVIDER_SITE_OTHER): Payer: Medicaid Other | Admitting: Podiatry

## 2019-08-03 DIAGNOSIS — Z5329 Procedure and treatment not carried out because of patient's decision for other reasons: Secondary | ICD-10-CM

## 2019-08-03 NOTE — Progress Notes (Signed)
Same day cancellation/no show 

## 2019-08-11 ENCOUNTER — Other Ambulatory Visit: Payer: Self-pay

## 2019-08-11 ENCOUNTER — Ambulatory Visit: Payer: Medicaid Other | Admitting: Podiatry

## 2019-08-11 ENCOUNTER — Ambulatory Visit (INDEPENDENT_AMBULATORY_CARE_PROVIDER_SITE_OTHER): Payer: Medicaid Other

## 2019-08-11 DIAGNOSIS — M21961 Unspecified acquired deformity of right lower leg: Secondary | ICD-10-CM

## 2019-08-11 DIAGNOSIS — M216X1 Other acquired deformities of right foot: Secondary | ICD-10-CM | POA: Diagnosis not present

## 2019-08-11 DIAGNOSIS — M2041 Other hammer toe(s) (acquired), right foot: Secondary | ICD-10-CM

## 2019-08-13 ENCOUNTER — Encounter: Payer: Self-pay | Admitting: Optometry

## 2019-08-25 DIAGNOSIS — F331 Major depressive disorder, recurrent, moderate: Secondary | ICD-10-CM | POA: Diagnosis not present

## 2019-08-29 ENCOUNTER — Ambulatory Visit: Payer: Self-pay | Admitting: Surgery

## 2019-08-29 DIAGNOSIS — R1011 Right upper quadrant pain: Secondary | ICD-10-CM | POA: Diagnosis not present

## 2019-08-29 NOTE — H&P (Signed)
History of Present Illness Jasmin Stephenson. Jasmin Zayed Stephenson; 08/29/2019 5:17 PM) The patient is a 48 year old female who presents with abdominal pain. This is a 48 year old female who underwent laparoscopic repair of hiatal hernia about 10 years ago. Apparently, she had primary repair of an umbilical hernia at the same time. This was performed by Dr. Zella Stephenson. Interestingly, records are not available in Epic from that surgery. The patient reports a 60 lb weight loss over the last 10 years.  Over the last few months, the patient has had several abdominal complaints. She is having pain around her umbilicus that radiates up towards her chest. This is becoming more severe. The pain is intermittent. She also has increasing heartburn symptoms, exacerbated by eating. She experiences heartburn, nausea, occasional diarrhea, and abdominal bloating. She has pain that radiates from her epigastrium to her RUQ. These symptoms are significantly worse after meals. She has not had any evaluation of any of these symptoms. She does not take any PPI or antacids.  We obtained studies to evaluate her Nissen fundoplication as well as her gallbladder. These showed no surgical problems at this time. She was previously seen by Jasmin Stephenson. She underwent EGD earlier this summer that was reportedly unremarkable. Colonoscopy was also performed which was reportedly normal.  The patient is status post open repair of a recurrent umbilical hernia on [id number]. She had a 1.5 cm defect that was repaired with a small ventral ex mesh. The patient is doing well at her umbilicus. The incision seems to be healed. She is happy with the appearance. There is no residual bulge. There is no residual pain in this area. However, she continues to have intermittent burning pain in her epigastrium that seems to be exacerbated by eating. She takes some when necessary antacids that seem to help. With careful questioning and meticulous history, the  patient's symptoms seem to be classic for gallbladder disease. She has had a negative ultrasound 2 and negative HIDA scan. However her symptoms seem to be increasing in severity.  CLINICAL DATA: RIGHT upper quadrant abdominal pain for 2 months.  EXAM: ULTRASOUND ABDOMEN LIMITED RIGHT UPPER QUADRANT  COMPARISON: None.  FINDINGS: Gallbladder:  No gallstones or wall thickening visualized. No sonographic Murphy sign noted by sonographer.  Common bile duct:  Diameter: 4 mm  Liver:  Liver is diffusely echogenic indicating fatty infiltration. No focal mass or lesion appreciated within the liver. Portal vein is patent on color Doppler imaging with normal direction of blood flow towards the liver.  IMPRESSION: 1. No acute findings. No gallstones seen. No evidence of cholecystitis. 2. Fatty infiltration of the liver.   Electronically Signed By: Jasmin Stephenson M.D. On: 06/02/2019 16:27  CLINICAL DATA: Right upper quadrant pain  EXAM: NUCLEAR MEDICINE HEPATOBILIARY IMAGING WITH GALLBLADDER EF  VIEWS: Anterior, right lateral right upper quadrant  RADIOPHARMACEUTICALS: 5.1 mCi Tc-42m Choletec IV  COMPARISON: None.  FINDINGS: Liver uptake of radiotracer is unremarkable. There is prompt visualization of gallbladder and small bowel, indicating patency of the cystic and common bile ducts. The patient consumed 8 ounces of Ensure Enlive orally with calculation of the computer generated ejection fraction of radiotracer from the gallbladder. The patient did not experience clinical symptoms post Ensure consumption. The computer generated ejection fraction of radiotracer from the gallbladder is normal at 78%, normal greater than 33% using the oral agent.  IMPRESSION: Study within normal limits.   Electronically Signed By: Jasmin Stephenson M.D. On: 11/25/2018 13:55   Problem List/Past Medical Jasmin Key  Oren Section, Stephenson; 08/29/2019 5:17 PM) ABSCESS OF BREAST,  RIGHT (N61.1) POSTOP CHECK (99991111) RECURRENT UMBILICAL HERNIA (Q000111Q) COLICKY RUQ ABDOMINAL PAIN (R10.11) CHRONIC GERD (K21.9)  Past Surgical History (Jasmin Stephenson; 08/29/2019 5:17 PM) Foot Surgery Bilateral. Resection of Stomach Spinal Surgery - Neck  Diagnostic Studies History Jasmin Stephenson. Jasmin Bunting, Stephenson; 08/29/2019 5:17 PM) Colonoscopy never Mammogram within last year Pap Smear 1-5 years ago  Allergies Jasmin Stephenson, CMA; 08/29/2019 2:15 PM) Penicillins Allergies Reconciled  Medication History (Jasmin Stephenson, CMA; 08/29/2019 2:16 PM) Pantoprazole Sodium (40MG  Tablet DR, Oral) Active. Medications Reconciled  Social History Jasmin Stephenson. Jasmin Fink, Stephenson; 08/29/2019 5:17 PM) Alcohol use Occasional alcohol use. Caffeine use Tea. Tobacco use Current every day smoker.  Family History Jasmin Stephenson. Jasmin Fitzwater, Stephenson; 08/29/2019 5:17 PM) Diabetes Mellitus Father, Mother. Arthritis Mother. Breast Cancer Sister. Heart Disease Father. Heart disease in female family member before age 41 Hypertension Father.  Pregnancy / Birth History Jasmin Stephenson. Jasmin Byrum, Stephenson; 08/29/2019 5:17 PM) Age at menarche 53 years. Gravida 3 Maternal age 19-20 Para 3 Regular periods  Other Problems Jasmin Stephenson. Jasmin Albo, Stephenson; 08/29/2019 5:17 PM) Anxiety Disorder Arthritis Back Pain Bladder Problems Chest pain Depression Gastroesophageal Reflux Disease High blood pressure    Vitals (Jasmin Stephenson CMA; 08/29/2019 2:16 PM) 08/29/2019 2:16 PM Weight: 212.25 lb Height: 64in Body Surface Area: 2.01 m Body Mass Index: 36.43 kg/m  Temp.: 97.49F(Temporal)  Pulse: 98 (Regular)  BP: 164/88 (Sitting, Left Arm, Standard)        Physical Exam Jasmin Key K. Neeya Prigmore Stephenson; 08/29/2019 5:18 PM)  The physical exam findings are as follows: Note:WDWN in NAD Eyes: Pupils equal, round; sclera anicteric HENT: Oral mucosa moist; good dentition Neck: No masses palpated, no thyromegaly Lungs: CTA  bilaterally; normal respiratory effort CV: Regular rate and rhythm; no murmurs; extremities well-perfused with no edema Abd: +bowel sounds, soft, healed umbilical incision without recurrent hernia. Mild epigastric/ RUQ tenderness Skin: Warm, dry; no sign of jaundice Psychiatric - alert and oriented x 4; calm mood and affect    Assessment & Plan Jasmin Key K. Everett Ricciardelli Stephenson; [id number] PM)  COLICKY RUQ ABDOMINAL PAIN (R10.11)  Current Plans Schedule for Surgery - Laparoscopic cholecystectomy with intraoperative cholangiogram. The surgical procedure has been discussed with the patient. Potential risks, benefits, alternative treatments, and expected outcomes have been explained. All of the patient's questions at this time have been answered. The likelihood of reaching the patient's treatment goal is good. The patient understand the proposed surgical procedure and wishes to proceed. Note:I spent some time discussing the situation with the patient. Although her ultrasound and HIDA scan are negative for gallbladder disease, her symptoms are classic. She has had further workup including EGD and colonoscopy that were negative. At this point, I can offer elective laparoscopic cholecystectomy with the understanding that there is a small Jasmin that this will not relieve her symptoms. The patient has done a lot of research on her own and she is convinced that her symptoms are from her gallbladder as well. Stephenson does not have anything additional to offer in her management.  We will proceed with laparoscopic cholecystectomy with cholangiogram.  Jasmin Stephenson. Georgette Dover, Stephenson, [facility] [phone]  08/29/2019 5:19 PM

## 2019-09-06 DIAGNOSIS — F4310 Post-traumatic stress disorder, unspecified: Secondary | ICD-10-CM | POA: Diagnosis not present

## 2019-09-10 NOTE — Progress Notes (Signed)
Chief Complaint  Patient presents with  . Foot Pain    pt is here for a f/u on her right foot, pt states that she hit her right foot on a chest, pt states that it happened 4 weeks ago, and noticed oozing coming out of it    Patient left prior to being seen by the physician.

## 2019-09-15 ENCOUNTER — Ambulatory Visit (INDEPENDENT_AMBULATORY_CARE_PROVIDER_SITE_OTHER): Payer: Medicaid Other | Admitting: Podiatry

## 2019-09-15 ENCOUNTER — Other Ambulatory Visit: Payer: Self-pay

## 2019-09-15 DIAGNOSIS — S91114A Laceration without foreign body of right lesser toe(s) without damage to nail, initial encounter: Secondary | ICD-10-CM | POA: Diagnosis not present

## 2019-09-15 DIAGNOSIS — M2041 Other hammer toe(s) (acquired), right foot: Secondary | ICD-10-CM | POA: Diagnosis not present

## 2019-10-02 NOTE — Progress Notes (Addendum)
[id number] [place], [place] [id number] [place] [id number] Phone: [phone] Fax: [phone]      Your procedure is scheduled on October 27  Report to [facility] Main Entrance "A" at 1200 P.M., and check in at the Admitting office.  Call this number if you have problems the morning of surgery:  [phone]  Call [phone] if you have any questions prior to your surgery date Monday-Friday 8am-4pm    Remember:  Do not eat  after midnight the night before your surgery  You may drink clear liquids until 1100 am the morning of your surgery.   Clear liquids allowed are: Water, Non-Citrus Juices (without pulp), Carbonated Beverages, Clear Tea, Black Coffee Only, and Gatorade    There are No medications that you need to take the morning of surgery   7 days prior to surgery STOP taking any Aspirin (unless otherwise instructed by your surgeon), Aleve, Naproxen, Ibuprofen, Motrin, Advil, Goody's, BC's, all herbal medications, fish oil, and all vitamins.    The Morning of Surgery  Do not wear jewelry, make-up or nail polish.  Do not wear lotions, powders, or perfumes/colognes, or deodorant  Do not shave 48 hours prior to surgery.    Do not bring valuables to the hospital.  [facility] is not responsible for any belongings or valuables.  If you are a smoker, DO NOT Smoke 24 hours prior to surgery IF you wear a CPAP at night please bring your mask, tubing, and machine the morning of surgery   Remember that you must have someone to transport you home after your surgery, and remain with you for 24 hours if you are discharged the same day.   Contacts, glasses, hearing aids, dentures or bridgework may not be worn into surgery.    Leave your suitcase in the car.  After surgery it may be brought to your room.  For patients admitted to the hospital, discharge time will be determined by your treatment  team.  Patients discharged the day of surgery will not be allowed to drive home.    Special instructions:   [place]- Preparing For Surgery  Before surgery, you can play an important role. Because skin is not sterile, your skin needs to be as free of germs as possible. You can reduce the number of germs on your skin by washing with CHG (chlorahexidine gluconate) Soap before surgery.  CHG is an antiseptic cleaner which kills germs and bonds with the skin to continue killing germs even after washing.    Oral Hygiene is also important to reduce your risk of infection.  Remember - BRUSH YOUR TEETH THE MORNING OF SURGERY WITH YOUR REGULAR TOOTHPASTE  Please do not use if you have an allergy to CHG or antibacterial soaps. If your skin becomes reddened/irritated stop using the CHG.  Do not shave (including legs and underarms) for at least 48 hours prior to first CHG shower. It is OK to shave your face.  Please follow these instructions carefully.   1. Shower the NIGHT BEFORE SURGERY and the MORNING OF SURGERY with CHG Soap.   2. If you chose to wash your hair, wash your hair first as usual with your normal shampoo.  3. After you shampoo, rinse your hair and body thoroughly to remove the shampoo.  4. Use CHG as you would any other liquid soap. You can apply CHG directly to the skin and wash gently  with a scrungie or a clean washcloth.   5. Apply the CHG Soap to your body ONLY FROM THE NECK DOWN.  Do not use on open wounds or open sores. Avoid contact with your eyes, ears, mouth and genitals (private parts). Wash Face and genitals (private parts)  with your normal soap.   6. Wash thoroughly, paying special attention to the area where your surgery will be performed.  7. Thoroughly rinse your body with warm water from the neck down.  8. DO NOT shower/wash with your normal soap after using and rinsing off the CHG Soap.  9. Pat yourself dry with a CLEAN TOWEL.  10. Wear CLEAN PAJAMAS to bed  the night before surgery, wear comfortable clothes the morning of surgery  11. Place CLEAN SHEETS on your bed the night of your first shower and DO NOT SLEEP WITH PETS.    Day of Surgery:  Do not apply any deodorants/lotions. Please shower the morning of surgery with the CHG soap  Please wear clean clothes to the hospital/surgery center.   Remember to brush your teeth WITH YOUR REGULAR TOOTHPASTE.   Please read over the following fact sheets that you were given.

## 2019-10-03 ENCOUNTER — Encounter (HOSPITAL_COMMUNITY)
Admission: RE | Admit: 2019-10-03 | Discharge: 2019-10-03 | Disposition: A | Discharge status: Home / Self Care | Payer: Medicaid Other | Source: Ambulatory Visit | Attending: Surgery | Admitting: Surgery

## 2019-10-03 ENCOUNTER — Other Ambulatory Visit: Payer: Self-pay

## 2019-10-03 ENCOUNTER — Encounter (HOSPITAL_COMMUNITY): Payer: Self-pay

## 2019-10-03 DIAGNOSIS — Z01818 Encounter for other preprocedural examination: Secondary | ICD-10-CM | POA: Diagnosis not present

## 2019-10-03 LAB — BASIC METABOLIC PANEL
Anion gap: 7 (ref 5–15)
BUN: <5 mg/dL — ABNORMAL LOW (ref 6–20)
CO2: 24 mmol/L (ref 22–32)
Calcium: 8.8 mg/dL — ABNORMAL LOW (ref 8.9–10.3)
Chloride: 109 mmol/L (ref 98–111)
Creatinine, Ser: 0.74 mg/dL (ref 0.44–1.00)
GFR calc Af Amer: >60 mL/min (ref >60)
GFR calc non Af Amer: >60 mL/min (ref >60)
Glucose, Bld: 98 mg/dL (ref 70–99)
Potassium: 3.5 mmol/L (ref 3.5–5.1)
Sodium: 140 mmol/L (ref 135–145)

## 2019-10-03 LAB — CBC
HCT: 31.1 % — ABNORMAL LOW (ref 36.0–46.0)
Hemoglobin: 9 g/dL — ABNORMAL LOW (ref 12.0–15.0)
MCH: 20.6 pg — ABNORMAL LOW (ref 26.0–34.0)
MCHC: 28.9 g/dL — ABNORMAL LOW (ref 30.0–36.0)
MCV: 71.3 fL — ABNORMAL LOW (ref 80.0–100.0)
Platelets: 521 10*3/uL — ABNORMAL HIGH (ref 150–400)
RBC: 4.36 MIL/uL (ref 3.87–5.11)
RDW: 19 % — ABNORMAL HIGH (ref 11.5–15.5)
WBC: 8.3 10*3/uL (ref 4.0–10.5)
nRBC: 0 % (ref 0.0–0.2)

## 2019-10-03 NOTE — Progress Notes (Signed)
PCP - Kathe Becton Cardiologist - denies   Chest x-ray - n/a EKG - 10/03/19 Stress Test - 2008?  ECHO - denies Cardiac Cath - deneis   ERAS Protcol - yes clears until 11 am PRE-SURGERY Ensure or G2- no drink ordered  COVID TEST- 10/06/19   Anesthesia review: NO  Patient denies shortness of breath, fever, cough and chest pain at PAT appointment   All instructions explained to the patient, with a verbal understanding of the material. Patient agrees to go over the instructions while at home for a better understanding. Patient also instructed to self quarantine after being tested for COVID-19. The opportunity to ask questions was provided.

## 2019-10-04 NOTE — Progress Notes (Addendum)
Anesthesia Chart Review:  Case: [id number] Date/Time: 10/10/19 1345   Procedure: LAPAROSCOPIC CHOLECYSTECTOMY WITH INTRAOPERATIVE CHOLANGIOGRAM (N/A )   Anesthesia type: General   Pre-op diagnosis: biliary colic   Location: [facility] OR ROOM 09 / [place] [facility]   Surgeon: Donnie Mesa, MD      DISCUSSION: Patient is a 48 year old female scheduled for the above procedure.  History includes smoking,GERD (s/p laparoscopic Nissen fundoplication 0/92/33), right breast calcification, asthma, anemia, anxiety, depression. S/p C6-7 anterior discectomy and arthroplasty 09/21/16. BMI is consistent with obesity. - Of note, "mild" MI was added to her history 09/18/16 during pre-operative RN interview prior to prior to C6-7 ACDF. I cannot find any evidence in [facility] to support this diagnosis. I called and talked with patient, and to her recollection she came to [facility] ED with chest pain several years ago and was told she had a small heart attack. Although she denied having a cardiac cath, she says she did see a cardiologist and was told her heart was "just fine." She thought it may be Charolette Forward, MD, but when I contacted his office they do have her name in their system but no active records. She does have a history of multiple ED visits. I found two visits in which she had troponins done which were negative. One visit was on 01/01/10 for asthma with costochondritis and the second visit was on 06/13/16 for chest pain/right shoulder pain that was reproducible and felt likely musculoskeletal or radicular related. She underwent C6-7 ACDF on 09/21/16 for cervical radiculopathy, HNP right C6-7. EKGs on 06/13/16 and 10/03/19 have shown NSR without ST abnormality or Q waves. She has had right shoulder pain related to her neck in the past, but over the past month seems more related to gall bladder disease as it occurs post-prandial with her N/V. She is to the point that she has to be very selective in what she eats and is drinking  liquids more than eating to help avoid abdominal pain/N/V. Stools are sometimes loose, but not obvious blood. She tells me that she is in communication with Dr. Vonna Kotyk staff about her symptoms. Prior to a month ago before her GI symptoms worsened, she was able to walk without limitation, go up flight of stairs. She denied SOB and edema. Reviewed this history with anesthesiologist Nolon Nations, MD with no additonal recommendations at this time. Her assigned anesthesiologist with re-assess on the day of surgery.  Preoperative labs show H/H 9.0/31.1, PLT 521. Results are consistent with previous labs on 03/06/19 done at PCP office (and started on ferrous sulfate, but unable to tolerate recently due to GI symptoms). She reports heavy menses. CBC results routed to Dr. Georgette Dover.  Will defer decision for T&S to surgeon and/or anesthesiologist.   COVID-19 test is scheduled for 10/06/19.    VS: BP 138/84   Pulse 86   Temp 37.1 C   Resp 20   Ht 5' 5"  (1.651 m)   Wt 94.8 kg   LMP 09/07/2019   SpO2 100%   BMI 34.78 kg/m   PROVIDERS: [name], FNP is PCP   LABS: Preoperative labs result reviewed. See DISCUSSION. (all labs ordered are listed, but only abnormal results are displayed)  Labs Reviewed  BASIC METABOLIC PANEL - Abnormal; Notable for the following components:      Result Value   BUN <5 (*)    Calcium 8.8 (*)    All other components within normal limits  CBC - Abnormal; Notable  for the following components:   Hemoglobin 9.0 (*)    HCT 31.1 (*)    MCV 71.3 (*)    MCH 20.6 (*)    MCHC 28.9 (*)    RDW 19.0 (*)    Platelets 521 (*)    All other components within normal limits     EKG: 10/03/19: NSR   CV: Denied prior stress, echo, cath.   Past Medical History:  Diagnosis Date  . Anemia   . Anxiety   . Arthritis    and tendonitis  . Asthma   . Breast calcification, right   . Cervical herniated disc   . Depression   . GERD (gastroesophageal reflux disease)    . Myocardial infarction ([place])    " Mild"  . Vitamin D deficiency     Past Surgical History:  Procedure Laterality Date  . ABDOMINAL SURGERY    . APPENDECTOMY    . BLADDER SURGERY    . CERVICAL DISC ARTHROPLASTY N/A 09/21/2016   Procedure: CERVICAL SIX- CERVICAL SEVEN ARTHROPLASTY;  Surgeon: Kevan Ny Ditty, MD;  Location: [place];  Service: Neurosurgery;  Laterality: N/A;  C6-7 Arthroplasty  . FOOT SURGERY Left    corrections on foot  . IRRIGATION AND DEBRIDEMENT ABSCESS Right 09/10/2014   Procedure: IRRIGATION AND DEBRIDEMENT RIGHT BREAST  ABSCESS;  Surgeon: Gayland Curry, MD;  Location: [facility];  Service: General;  Laterality: Right;  . WISDOM TOOTH EXTRACTION      MEDICATIONS: . albuterol (PROVENTIL HFA;VENTOLIN HFA) 108 (90 Base) MCG/ACT inhaler  . albuterol (PROVENTIL) (2.5 MG/3ML) 0.083% nebulizer solution  . cetirizine (ZYRTEC) 10 MG tablet  . ferrous sulfate 325 (65 FE) MG tablet  . fluticasone (FLONASE) 50 MCG/ACT nasal spray  . oxybutynin (DITROPAN) 5 MG tablet  . Respiratory Therapy Supplies (NEBULIZER/TUBING/MOUTHPIECE) KIT  . Vitamin D, Ergocalciferol, (DRISDOL) 1.25 MG (50000 UT) CAPS capsule   No current facility-administered medications for this encounter.     Myra Gianotti, PA-C Surgical Short Stay/Anesthesiology [facility] Phone [phone] [facility] Phone [phone] 10/05/2019 4:07 PM

## 2019-10-05 NOTE — Anesthesia Preprocedure Evaluation (Addendum)
Anesthesia Evaluation  Patient identified by MRN, date of birth, ID band Patient awake    Reviewed: Allergy & Precautions, NPO status , Patient's Chart, lab work & pertinent test results  Airway Mallampati: II  TM Distance: >3 FB Neck ROM: Full    Dental  (+) Teeth Intact, Dental Advisory Given   Pulmonary Current Smoker and Patient abstained from smoking.,    breath sounds clear to auscultation       Cardiovascular  Rhythm:Regular Rate:Normal     Neuro/Psych    GI/Hepatic   Endo/Other    Renal/GU      Musculoskeletal   Abdominal (+) + obese,   Peds  Hematology   Anesthesia Other Findings   Reproductive/Obstetrics                            Anesthesia Physical Anesthesia Plan  ASA: III  Anesthesia Plan: General   Post-op Pain Management:    Induction: Intravenous  PONV Risk Score and Plan: Ondansetron and Dexamethasone  Airway Management Planned: Oral ETT  Additional Equipment:   Intra-op Plan:   Post-operative Plan: Extubation in OR  Informed Consent: I have reviewed the patients History and Physical, chart, labs and discussed the procedure including the risks, benefits and alternatives for the proposed anesthesia with the patient or authorized representative who has indicated his/her understanding and acceptance.     Dental advisory given  Plan Discussed with: CRNA and Anesthesiologist  Anesthesia Plan Comments: (PAT note written by Myra Gianotti, PA-C. HGB 9.0, consistent with previous results. If T&S desired then please order.   )       Anesthesia Quick Evaluation

## 2019-10-06 ENCOUNTER — Other Ambulatory Visit (HOSPITAL_COMMUNITY)
Admission: RE | Admit: 2019-10-06 | Discharge: 2019-10-06 | Disposition: A | Discharge status: Home / Self Care | Payer: Medicaid Other | Source: Ambulatory Visit | Attending: Surgery | Admitting: Surgery

## 2019-10-06 DIAGNOSIS — Z20828 Contact with and (suspected) exposure to other viral communicable diseases: Secondary | ICD-10-CM | POA: Insufficient documentation

## 2019-10-06 DIAGNOSIS — Z01812 Encounter for preprocedural laboratory examination: Secondary | ICD-10-CM | POA: Diagnosis not present

## 2019-10-07 LAB — NOVEL CORONAVIRUS, NAA (HOSP ORDER, SEND-OUT TO REF LAB; TAT 18-24 HRS): SARS-CoV-2, NAA: NOT DETECTED

## 2019-10-10 ENCOUNTER — Ambulatory Visit (HOSPITAL_COMMUNITY)
Admission: RE | Admit: 2019-10-10 | Discharge: 2019-10-10 | Disposition: A | Discharge status: Home / Self Care | Payer: Medicaid Other | Attending: Surgery | Admitting: Surgery

## 2019-10-10 ENCOUNTER — Ambulatory Visit (HOSPITAL_COMMUNITY): Payer: Medicaid Other

## 2019-10-10 ENCOUNTER — Encounter (HOSPITAL_COMMUNITY): Payer: Self-pay

## 2019-10-10 ENCOUNTER — Ambulatory Visit (HOSPITAL_COMMUNITY): Payer: Medicaid Other | Admitting: Certified Registered Nurse Anesthetist

## 2019-10-10 ENCOUNTER — Ambulatory Visit (HOSPITAL_COMMUNITY): Payer: Medicaid Other | Admitting: Vascular Surgery

## 2019-10-10 ENCOUNTER — Other Ambulatory Visit: Payer: Self-pay

## 2019-10-10 ENCOUNTER — Encounter (HOSPITAL_COMMUNITY)
Admission: RE | Disposition: A | Discharge status: Home / Self Care | Payer: Self-pay | Source: Home / Self Care | Attending: Surgery

## 2019-10-10 DIAGNOSIS — K219 Gastro-esophageal reflux disease without esophagitis: Secondary | ICD-10-CM | POA: Insufficient documentation

## 2019-10-10 DIAGNOSIS — Z8719 Personal history of other diseases of the digestive system: Secondary | ICD-10-CM | POA: Diagnosis not present

## 2019-10-10 DIAGNOSIS — Z880 Allergy status to penicillin: Secondary | ICD-10-CM | POA: Insufficient documentation

## 2019-10-10 DIAGNOSIS — Z419 Encounter for procedure for purposes other than remedying health state, unspecified: Secondary | ICD-10-CM

## 2019-10-10 DIAGNOSIS — K811 Chronic cholecystitis: Secondary | ICD-10-CM | POA: Diagnosis not present

## 2019-10-10 DIAGNOSIS — K8010 Calculus of gallbladder with chronic cholecystitis without obstruction: Secondary | ICD-10-CM | POA: Diagnosis not present

## 2019-10-10 DIAGNOSIS — F17200 Nicotine dependence, unspecified, uncomplicated: Secondary | ICD-10-CM | POA: Insufficient documentation

## 2019-10-10 DIAGNOSIS — J45909 Unspecified asthma, uncomplicated: Secondary | ICD-10-CM | POA: Diagnosis not present

## 2019-10-10 DIAGNOSIS — D649 Anemia, unspecified: Secondary | ICD-10-CM | POA: Diagnosis not present

## 2019-10-10 HISTORY — PX: CHOLECYSTECTOMY: SHX55

## 2019-10-10 LAB — POCT PREGNANCY, URINE: Preg Test, Ur: NEGATIVE

## 2019-10-10 SURGERY — LAPAROSCOPIC CHOLECYSTECTOMY WITH INTRAOPERATIVE CHOLANGIOGRAM
Anesthesia: General | Site: Abdomen

## 2019-10-10 MED ORDER — ONDANSETRON HCL 4 MG/2ML IJ SOLN
INTRAMUSCULAR | Status: AC
  Filled 2019-10-10: qty 2

## 2019-10-10 MED ORDER — OXYCODONE HCL 5 MG PO TABS: 5.0000 mg | ORAL_TABLET | Freq: Four times a day (QID) | ORAL | 0 refills | Status: DC | PRN

## 2019-10-10 MED ORDER — BUPIVACAINE HCL (PF) 0.25 % IJ SOLN
INTRAMUSCULAR | Status: DC | PRN
  Administered 2019-10-10: 8 mL

## 2019-10-10 MED ORDER — LACTATED RINGERS IV SOLN
INTRAVENOUS | Status: DC
  Administered 2019-10-10 (×2): via INTRAVENOUS

## 2019-10-10 MED ORDER — FENTANYL CITRATE (PF) 100 MCG/2ML IJ SOLN: 25.0000 ug | INTRAMUSCULAR | Status: DC | PRN

## 2019-10-10 MED ORDER — FENTANYL CITRATE (PF) 250 MCG/5ML IJ SOLN
INTRAMUSCULAR | Status: AC
  Filled 2019-10-10: qty 5

## 2019-10-10 MED ORDER — ROCURONIUM BROMIDE 50 MG/5ML IV SOSY
PREFILLED_SYRINGE | INTRAVENOUS | Status: DC | PRN
  Administered 2019-10-10: 50 mg via INTRAVENOUS

## 2019-10-10 MED ORDER — ACETAMINOPHEN 500 MG PO TABS
ORAL_TABLET | ORAL | Status: AC
  Administered 2019-10-10: 1000 mg via ORAL
  Filled 2019-10-10: qty 2

## 2019-10-10 MED ORDER — SUCCINYLCHOLINE CHLORIDE 200 MG/10ML IV SOSY
PREFILLED_SYRINGE | INTRAVENOUS | Status: DC | PRN
  Administered 2019-10-10: 160 mg via INTRAVENOUS

## 2019-10-10 MED ORDER — SODIUM CHLORIDE 0.9 % IV SOLN
INTRAVENOUS | Status: DC | PRN
  Administered 2019-10-10: 15 mL

## 2019-10-10 MED ORDER — MIDAZOLAM HCL 2 MG/2ML IJ SOLN
INTRAMUSCULAR | Status: DC | PRN
  Administered 2019-10-10: 2 mg via INTRAVENOUS

## 2019-10-10 MED ORDER — MIDAZOLAM HCL 2 MG/2ML IJ SOLN
INTRAMUSCULAR | Status: AC
  Filled 2019-10-10: qty 2

## 2019-10-10 MED ORDER — DEXAMETHASONE SODIUM PHOSPHATE 10 MG/ML IJ SOLN
INTRAMUSCULAR | Status: DC | PRN
  Administered 2019-10-10: 10 mg via INTRAVENOUS

## 2019-10-10 MED ORDER — PROPOFOL 10 MG/ML IV BOLUS
INTRAVENOUS | Status: DC | PRN
  Administered 2019-10-10: 150 mg via INTRAVENOUS

## 2019-10-10 MED ORDER — SUGAMMADEX SODIUM 200 MG/2ML IV SOLN
INTRAVENOUS | Status: DC | PRN
  Administered 2019-10-10: 300 mg via INTRAVENOUS

## 2019-10-10 MED ORDER — ONDANSETRON HCL 4 MG/2ML IJ SOLN: 4.0000 mg | Freq: Once | INTRAMUSCULAR | Status: DC | PRN

## 2019-10-10 MED ORDER — ONDANSETRON HCL 4 MG/2ML IJ SOLN
INTRAMUSCULAR | Status: DC | PRN
  Administered 2019-10-10: 4 mg via INTRAVENOUS

## 2019-10-10 MED ORDER — DEXAMETHASONE SODIUM PHOSPHATE 10 MG/ML IJ SOLN
INTRAMUSCULAR | Status: AC
  Filled 2019-10-10: qty 1

## 2019-10-10 MED ORDER — PROPOFOL 10 MG/ML IV BOLUS
INTRAVENOUS | Status: AC
  Filled 2019-10-10: qty 20

## 2019-10-10 MED ORDER — ROCURONIUM BROMIDE 10 MG/ML (PF) SYRINGE
PREFILLED_SYRINGE | INTRAVENOUS | Status: AC
  Filled 2019-10-10: qty 10

## 2019-10-10 MED ORDER — FENTANYL CITRATE (PF) 100 MCG/2ML IJ SOLN
INTRAMUSCULAR | Status: DC | PRN
  Administered 2019-10-10: 50 ug via INTRAVENOUS
  Administered 2019-10-10: 100 ug via INTRAVENOUS
  Administered 2019-10-10 (×2): 50 ug via INTRAVENOUS

## 2019-10-10 MED ORDER — CIPROFLOXACIN IN D5W 400 MG/200ML IV SOLN
400.0000 mg | INTRAVENOUS | Status: AC
  Administered 2019-10-10: 400 mg via INTRAVENOUS

## 2019-10-10 MED ORDER — LIDOCAINE 2% (20 MG/ML) 5 ML SYRINGE
INTRAMUSCULAR | Status: AC
  Filled 2019-10-10: qty 5

## 2019-10-10 MED ORDER — PHENYLEPHRINE 40 MCG/ML (10ML) SYRINGE FOR IV PUSH (FOR BLOOD PRESSURE SUPPORT)
PREFILLED_SYRINGE | INTRAVENOUS | Status: DC | PRN
  Administered 2019-10-10 (×2): 80 ug via INTRAVENOUS

## 2019-10-10 MED ORDER — 0.9 % SODIUM CHLORIDE (POUR BTL) OPTIME
TOPICAL | Status: DC | PRN
  Administered 2019-10-10: 1000 mL

## 2019-10-10 MED ORDER — OXYCODONE HCL 5 MG PO TABS: 5.0000 mg | ORAL_TABLET | Freq: Once | ORAL | Status: DC | PRN

## 2019-10-10 MED ORDER — ONDANSETRON HCL 4 MG/2ML IJ SOLN
4.0000 mg | Freq: Once | INTRAMUSCULAR | Status: AC | PRN
  Administered 2019-10-10: 4 mg via INTRAVENOUS

## 2019-10-10 MED ORDER — FENTANYL CITRATE (PF) 100 MCG/2ML IJ SOLN
INTRAMUSCULAR | Status: AC
  Administered 2019-10-10: 25 ug via INTRAVENOUS
  Filled 2019-10-10: qty 2

## 2019-10-10 MED ORDER — OXYCODONE HCL 5 MG/5ML PO SOLN: 5.0000 mg | Freq: Once | ORAL | Status: DC | PRN

## 2019-10-10 MED ORDER — CHLORHEXIDINE GLUCONATE CLOTH 2 % EX PADS: 6.0000 | MEDICATED_PAD | Freq: Once | CUTANEOUS | Status: DC

## 2019-10-10 MED ORDER — LIDOCAINE 2% (20 MG/ML) 5 ML SYRINGE
INTRAMUSCULAR | Status: DC | PRN
  Administered 2019-10-10: 80 mg via INTRAVENOUS

## 2019-10-10 MED ORDER — GABAPENTIN 300 MG PO CAPS
ORAL_CAPSULE | ORAL | Status: AC
  Administered 2019-10-10: 300 mg via ORAL
  Filled 2019-10-10: qty 1

## 2019-10-10 MED ORDER — ACETAMINOPHEN 500 MG PO TABS
1000.0000 mg | ORAL_TABLET | ORAL | Status: AC
  Administered 2019-10-10: 13:00:00 1000 mg via ORAL

## 2019-10-10 MED ORDER — CIPROFLOXACIN IN D5W 400 MG/200ML IV SOLN
INTRAVENOUS | Status: AC
  Filled 2019-10-10: qty 200

## 2019-10-10 MED ORDER — SODIUM CHLORIDE 0.9 % IR SOLN
Status: DC | PRN
  Administered 2019-10-10: 1000 mL

## 2019-10-10 MED ORDER — GABAPENTIN 300 MG PO CAPS
300.0000 mg | ORAL_CAPSULE | ORAL | Status: AC
  Administered 2019-10-10: 13:00:00 300 mg via ORAL

## 2019-10-10 MED ORDER — FENTANYL CITRATE (PF) 100 MCG/2ML IJ SOLN
25.0000 ug | INTRAMUSCULAR | Status: DC | PRN
  Administered 2019-10-10: 50 ug via INTRAVENOUS
  Administered 2019-10-10 (×2): 25 ug via INTRAVENOUS

## 2019-10-10 SURGICAL SUPPLY — 48 items
APPLIER CLIP ROT 10 11.4 M/L (STAPLE) ×3
BENZOIN TINCTURE PRP APPL 2/3 (GAUZE/BANDAGES/DRESSINGS) ×3 IMPLANT
BLADE CLIPPER SURG (BLADE) IMPLANT
CANISTER SUCT 3000ML PPV (MISCELLANEOUS) ×3 IMPLANT
CHLORAPREP W/TINT 26 (MISCELLANEOUS) ×3 IMPLANT
CLIP APPLIE ROT 10 11.4 M/L (STAPLE) ×1 IMPLANT
CLOSURE STERI-STRIP 1/2X4 (GAUZE/BANDAGES/DRESSINGS) ×1
CLOSURE WOUND 1/2 X4 (GAUZE/BANDAGES/DRESSINGS) ×1
CLSR STERI-STRIP ANTIMIC 1/2X4 (GAUZE/BANDAGES/DRESSINGS) ×1 IMPLANT
COVER MAYO STAND STRL (DRAPES) ×3 IMPLANT
COVER SURGICAL LIGHT HANDLE (MISCELLANEOUS) ×3 IMPLANT
COVER WAND RF STERILE (DRAPES) ×1 IMPLANT
DRAPE C-ARM 42X120 X-RAY (DRAPES) ×3 IMPLANT
DRSG TEGADERM 2-3/8X2-3/4 SM (GAUZE/BANDAGES/DRESSINGS) ×5 IMPLANT
DRSG TEGADERM 4X4.75 (GAUZE/BANDAGES/DRESSINGS) ×3 IMPLANT
ELECT REM PT RETURN 9FT ADLT (ELECTROSURGICAL) ×3
ELECTRODE REM PT RTRN 9FT ADLT (ELECTROSURGICAL) ×1 IMPLANT
GAUZE SPONGE 2X2 8PLY STRL LF (GAUZE/BANDAGES/DRESSINGS) ×1 IMPLANT
GLOVE BIO SURGEON STRL SZ7 (GLOVE) ×3 IMPLANT
GLOVE BIOGEL PI IND STRL 7.5 (GLOVE) ×1 IMPLANT
GLOVE BIOGEL PI INDICATOR 7.5 (GLOVE) ×2
GOWN STRL REUS W/ TWL LRG LVL3 (GOWN DISPOSABLE) ×3 IMPLANT
GOWN STRL REUS W/TWL LRG LVL3 (GOWN DISPOSABLE) ×6
KIT BASIN OR (CUSTOM PROCEDURE TRAY) ×3 IMPLANT
KIT TURNOVER KIT B (KITS) ×3 IMPLANT
NS IRRIG 1000ML POUR BTL (IV SOLUTION) ×3 IMPLANT
PAD ARMBOARD 7.5X6 YLW CONV (MISCELLANEOUS) ×3 IMPLANT
POUCH RETRIEVAL ECOSAC 10 (ENDOMECHANICALS) IMPLANT
POUCH RETRIEVAL ECOSAC 10MM (ENDOMECHANICALS)
POUCH SPECIMEN RETRIEVAL 10MM (ENDOMECHANICALS) ×2 IMPLANT
SCISSORS LAP 5X35 DISP (ENDOMECHANICALS) ×3 IMPLANT
SET CHOLANGIOGRAPH 5 50 .035 (SET/KITS/TRAYS/PACK) ×3 IMPLANT
SET IRRIG TUBING LAPAROSCOPIC (IRRIGATION / IRRIGATOR) ×3 IMPLANT
SET TUBE SMOKE EVAC HIGH FLOW (TUBING) ×3 IMPLANT
SLEEVE ENDOPATH XCEL 5M (ENDOMECHANICALS) ×3 IMPLANT
SPECIMEN JAR SMALL (MISCELLANEOUS) ×3 IMPLANT
SPONGE GAUZE 2X2 8PLY STER LF (GAUZE/BANDAGES/DRESSINGS) ×1
SPONGE GAUZE 2X2 8PLY STRL LF (GAUZE/BANDAGES/DRESSINGS) ×1 IMPLANT
SPONGE GAUZE 2X2 STER 10/PKG (GAUZE/BANDAGES/DRESSINGS) ×2
STRIP CLOSURE SKIN 1/2X4 (GAUZE/BANDAGES/DRESSINGS) ×2 IMPLANT
SUT MNCRL AB 4-0 PS2 18 (SUTURE) ×3 IMPLANT
TOWEL GREEN STERILE (TOWEL DISPOSABLE) ×3 IMPLANT
TOWEL GREEN STERILE FF (TOWEL DISPOSABLE) ×3 IMPLANT
TRAY LAPAROSCOPIC MC (CUSTOM PROCEDURE TRAY) ×3 IMPLANT
TROCAR XCEL BLUNT TIP 100MML (ENDOMECHANICALS) ×3 IMPLANT
TROCAR XCEL NON-BLD 11X100MML (ENDOMECHANICALS) ×3 IMPLANT
TROCAR XCEL NON-BLD 5MMX100MML (ENDOMECHANICALS) ×3 IMPLANT
WATER STERILE IRR 1000ML POUR (IV SOLUTION) ×3 IMPLANT

## 2019-10-10 NOTE — H&P (Signed)
History of Present Illness  The patient is a 48 year old female who presents with abdominal pain. This is a 48 year old female who underwent laparoscopic repair of hiatal hernia about 10 years ago. Apparently, she had primary repair of an umbilical hernia at the same time. This was performed by Dr. Zella Richer. Interestingly, records are not available in Epic from that surgery. The patient reports a 60 lb weight loss over the last 10 years.  Over the last few months, the patient has had several abdominal complaints. She is having pain around her umbilicus that radiates up towards her chest. This is becoming more severe. The pain is intermittent. She also has increasing heartburn symptoms, exacerbated by eating. She experiences heartburn, nausea, occasional diarrhea, and abdominal bloating. She has pain that radiates from her epigastrium to her RUQ. These symptoms are significantly worse after meals. She has not had any evaluation of any of these symptoms. She does not take any PPI or antacids.  We obtained studies to evaluate her Nissen fundoplication as well as her gallbladder. These showed no surgical problems at this time. She was previously seen by [facility]. She underwent EGD earlier this summer that was reportedly unremarkable. Colonoscopy was also performed which was reportedly normal.  The patient is status post open repair of a recurrent umbilical hernia on [id number]. She had a 1.5 cm defect that was repaired with a small ventral ex mesh. The patient is doing well at her umbilicus. The incision seems to be healed. She is happy with the appearance. There is no residual bulge. There is no residual pain in this area. However, she continues to have intermittent burning pain in her epigastrium that seems to be exacerbated by eating. She takes some when necessary antacids that seem to help. With careful questioning and meticulous history, the patient's symptoms seem to be classic  for gallbladder disease. She has had a negative ultrasound 2 and negative HIDA scan. However her symptoms seem to be increasing in severity.  CLINICAL DATA: RIGHT upper quadrant abdominal pain for 2 months.  EXAM: ULTRASOUND ABDOMEN LIMITED RIGHT UPPER QUADRANT  COMPARISON: None.  FINDINGS: Gallbladder:  No gallstones or wall thickening visualized. No sonographic Murphy sign noted by sonographer.  Common bile duct:  Diameter: 4 mm  Liver:  Liver is diffusely echogenic indicating fatty infiltration. No focal mass or lesion appreciated within the liver. Portal vein is patent on color Doppler imaging with normal direction of blood flow towards the liver.  IMPRESSION: 1. No acute findings. No gallstones seen. No evidence of cholecystitis. 2. Fatty infiltration of the liver.   Electronically Signed By: Franki Cabot M.D. On: 06/02/2019 16:27  CLINICAL DATA: Right upper quadrant pain  EXAM: NUCLEAR MEDICINE HEPATOBILIARY IMAGING WITH GALLBLADDER EF  VIEWS: Anterior, right lateral right upper quadrant  RADIOPHARMACEUTICALS: 5.1 mCi Tc-95m Choletec IV  COMPARISON: None.  FINDINGS: Liver uptake of radiotracer is unremarkable. There is prompt visualization of gallbladder and small bowel, indicating patency of the cystic and common bile ducts. The patient consumed 8 ounces of Ensure Enlive orally with calculation of the computer generated ejection fraction of radiotracer from the gallbladder. The patient did not experience clinical symptoms post Ensure consumption. The computer generated ejection fraction of radiotracer from the gallbladder is normal at 78%, normal greater than 33% using the oral agent.  IMPRESSION: Study within normal limits.   Electronically Signed By: Lowella Grip III M.D. On: 11/25/2018 13:55   Problem List/Past Medical  ABSCESS OF BREAST, RIGHT (N61.1) POSTOP  CHECK (99991111) RECURRENT UMBILICAL  HERNIA (Q000111Q) COLICKY RUQ ABDOMINAL PAIN (R10.11) CHRONIC GERD (K21.9)  Past Surgical History  Foot Surgery Bilateral. Resection of Stomach Spinal Surgery - Neck  Diagnostic Studies History  Colonoscopy never Mammogram within last year Pap Smear 1-5 years ago  Allergies Penicillins Allergies Reconciled  Medication History  Pantoprazole Sodium (40MG  Tablet DR, Oral) Active. Medications Reconciled  Social History  Alcohol use Occasional alcohol use. Caffeine use Tea. Tobacco use Current every day smoker.  Family History Diabetes Mellitus Father, Mother. Arthritis Mother. Breast Cancer Sister. Heart Disease Father. Heart disease in female family member before age 28 Hypertension Father.  Pregnancy / Birth History  Age at menarche 48 years. Gravida 3 Maternal age 69-20 Para 3 Regular periods  Other Problems  Anxiety Disorder Arthritis Back Pain Bladder Problems Chest pain Depression Gastroesophageal Reflux Disease High blood pressure    Vitals ([name] CMA; 08/29/2019 2:16 PM) 08/29/2019 2:16 PM Weight: 212.25 lb Height: 64in Body Surface Area: 2.01 m Body Mass Index: 36.43 kg/m  Temp.: 97.41F(Temporal)  Pulse: 98 (Regular)  BP: 164/88 (Sitting, Left Arm, Standard)        Physical Exam   The physical exam findings are as follows: Note:WDWN in NAD Eyes: Pupils equal, round; sclera anicteric HENT: Oral mucosa moist; good dentition Neck: No masses palpated, no thyromegaly Lungs: CTA bilaterally; normal respiratory effort CV: Regular rate and rhythm; no murmurs; extremities well-perfused with no edema Abd: +bowel sounds, soft, healed umbilical incision without recurrent hernia. Mild epigastric/ RUQ tenderness Skin: Warm, dry; no sign of jaundice Psychiatric - alert and oriented x 4; calm mood and affect    Assessment & Plan   COLICKY RUQ ABDOMINAL PAIN  (R10.11)  Current Plans Schedule for Surgery - Laparoscopic cholecystectomy with intraoperative cholangiogram. The surgical procedure has been discussed with the patient. Potential risks, benefits, alternative treatments, and expected outcomes have been explained. All of the patient's questions at this time have been answered. The likelihood of reaching the patient's treatment goal is good. The patient understand the proposed surgical procedure and wishes to proceed. Note:I spent some time discussing the situation with the patient. Although her ultrasound and HIDA scan are negative for gallbladder disease, her symptoms are classic. She has had further workup including EGD and colonoscopy that were negative. At this point, I can offer elective laparoscopic cholecystectomy with the understanding that there is a small chance that this will not relieve her symptoms. The patient has done a lot of research on her own and she is convinced that her symptoms are from her gallbladder as well. GI does not have anything additional to offer in her management.  We will proceed with laparoscopic cholecystectomy with cholangiogram.  Imogene Burn. Georgette Dover, MD, [facility]  General/ Trauma Surgery   10/10/2019 2:03 PM

## 2019-10-10 NOTE — Op Note (Signed)
Laparoscopic Cholecystectomy with IOC Procedure Note  Indications: The patient is a 48 year old female who presents with abdominal pain. This is a 48 year old female who underwent laparoscopic repair of hiatal hernia about 10 years ago. Apparently, she had primary repair of an umbilical hernia at the same time. This was performed by Dr. Zella Richer. Interestingly, records are not available in Epic from that surgery. The patient reports a 60 lb weight loss over the last 10 years.  Over the last few months, the patient has had several abdominal complaints. She is having pain around her umbilicus that radiates up towards her chest. This is becoming more severe. The pain is intermittent. She also has increasing heartburn symptoms, exacerbated by eating. She experiences heartburn, nausea, occasional diarrhea, and abdominal bloating. She has pain that radiates from her epigastrium to her RUQ. These symptoms are significantly worse after meals. She has not had any evaluation of any of these symptoms. She does not take any PPI or antacids.  We obtained studies to evaluate her Nissen fundoplication as well as her gallbladder. These showed no surgical problems at this time. She was previously seen by [facility]. She underwent EGD earlier this summer that was reportedly unremarkable. Colonoscopy was also performed which was reportedly normal.  The patient is status post open repair of a recurrent umbilical hernia on [id number]. She had a 1.5 cm defect that was repaired with a small ventral ex mesh. The patient is doing well at her umbilicus. The incision seems to be healed. She is happy with the appearance. There is no residual bulge. There is no residual pain in this area. However, she continues to have intermittent burning pain in her epigastrium that seems to be exacerbated by eating. She takes some when necessary antacids that seem to help. With careful questioning and meticulous history, the  patient's symptoms seem to be classic for gallbladder disease. She has had a negative ultrasound 2 and negative HIDA scan. However her symptoms seem to be increasing in severity.   Pre-operative Diagnosis: Chronic cholecystitis  Post-operative Diagnosis: Same  Surgeon: Maia Petties   Assistants: none  Anesthesia: General endotracheal anesthesia  ASA Class: 2  Procedure Details  The patient was seen again in the Holding Room. The risks, benefits, complications, treatment options, and expected outcomes were discussed with the patient. The possibilities of reaction to medication, pulmonary aspiration, perforation of viscus, bleeding, recurrent infection, finding a normal gallbladder, the need for additional procedures, failure to diagnose a condition, the possible need to convert to an open procedure, and creating a complication requiring transfusion or operation were discussed with the patient. The likelihood of improving the patient's symptoms with return to their baseline status is good.  The patient and/or family concurred with the proposed plan, giving informed consent. The site of surgery properly noted. The patient was taken to Operating Room, identified as YATZIRY VILE and the procedure verified as Laparoscopic Cholecystectomy with Intraoperative Cholangiogram. A Time Out was held and the above information confirmed.  Prior to the induction of general anesthesia, antibiotic prophylaxis was administered. General endotracheal anesthesia was then administered and tolerated well. After the induction, the abdomen was prepped with Chloraprep and draped in the sterile fashion. The patient was positioned in the supine position.  Local anesthetic agent was injected into the skin below the left costal margin.  We used a 5 mm Optiview trocar to cannulate the peritoneal cavity.  Pneumoperitoneum was then created with CO2 and tolerated well without any  adverse changes in the patient's vital  signs.   We examined the umbilicus.  The hernia mesh seems to be in place and there is no sign of recurrence.  I made a transverse incision above the umbilicus and the hernia mesh. We dissected down to the abdominal fascia with blunt dissection.  The fascia was incised vertically and we entered the peritoneal cavity bluntly.  A pursestring suture of 0-Vicryl was placed around the fascial opening.  The Hasson cannula was inserted and secured with the stay suture.An 11-mm port was placed in the subxiphoid position to replace the initial Optiview port.  Two 5-mm ports were placed in the right upper quadrant. All skin incisions were infiltrated with a local anesthetic agent before making the incision and placing the trocars.   We positioned the patient in reverse Trendelenburg, tilted slightly to the patient's left.  The gallbladder was identified, the fundus grasped and retracted cephalad. Adhesions were lysed bluntly and with the electrocautery where indicated, taking care not to injure any adjacent organs or viscus. The infundibulum was grasped and retracted laterally, exposing the peritoneum overlying the triangle of Calot. This was then divided and exposed in a blunt fashion. A critical view of the cystic duct and cystic artery was obtained.  The cystic duct was clearly identified and bluntly dissected circumferentially. The cystic duct was ligated with a clip distally.   An incision was made in the cystic duct and the [facility] cholangiogram catheter introduced. The catheter was secured using a clip. A cholangiogram was then obtained which showed good visualization of the distal and proximal biliary tree with no sign of filling defects or obstruction.  Contrast flowed easily into the duodenum. The catheter was then removed.   The cystic duct was then ligated with clips and divided. The cystic artery was identified, dissected free, ligated with clips and divided as well.   The gallbladder was dissected from the  liver bed in retrograde fashion with the electrocautery. The gallbladder was removed and placed in an Endocatch sac. The liver bed was irrigated and inspected. Hemostasis was achieved with the electrocautery. Copious irrigation was utilized and was repeatedly aspirated until clear.  The gallbladder and Endocatch sac were then removed through the umbilical port site.  The pursestring suture was used to close the umbilical fascia.    We again inspected the right upper quadrant for hemostasis.  Pneumoperitoneum was released as we removed the trocars.  4-0 Monocryl was used to close the skin.   Benzoin, steri-strips, and clean dressings were applied. The patient was then extubated and brought to the recovery room in stable condition. Instrument, sponge, and needle counts were correct at closure and at the conclusion of the case.   Findings: Cholecystitis without Cholelithiasis  Estimated Blood Loss: less than 50 mL         Drains: none         Specimens: Gallbladder           Complications: None; patient tolerated the procedure well.         Disposition: PACU - hemodynamically stable.         Condition: stable  Jasmin Stephenson. Georgette Dover, MD, [facility]  General/ Trauma Surgery   10/10/2019 4:05 PM

## 2019-10-10 NOTE — Transfer of Care (Signed)
Immediate Anesthesia Transfer of Care Note  Patient: Jasmin Stephenson  Procedure(s) Performed: LAPAROSCOPIC CHOLECYSTECTOMY WITH INTRAOPERATIVE CHOLANGIOGRAM (N/A Abdomen)  Patient Location: PACU  Anesthesia Type:General  Level of Consciousness: drowsy  Airway & Oxygen Therapy: Patient Spontanous Breathing and Patient connected to nasal cannula oxygen  Post-op Assessment: Report given to RN, Post -op Vital signs reviewed and stable and Patient moving all extremities  Post vital signs: Reviewed and stable  Last Vitals:  Vitals Value Taken Time  BP 154/113 10/10/19 1622  Temp 36.7 C 10/10/19 1622  Pulse 73 10/10/19 1622  Resp 15 10/10/19 1625  SpO2    Vitals shown include unvalidated device data.  Last Pain:  Vitals:   10/10/19 1622  TempSrc:   PainSc: (P) Asleep         Complications: No apparent anesthesia complications

## 2019-10-10 NOTE — Discharge Instructions (Signed)
[facility] ______[facility] °LAPAROSCOPIC SURGERY: POST OP INSTRUCTIONS °Always review your discharge instruction sheet given to you by the facility where your surgery was performed. °IF YOU HAVE DISABILITY OR FAMILY LEAVE FORMS, YOU MUST BRING THEM TO THE OFFICE FOR PROCESSING.   °DO NOT GIVE THEM TO YOUR DOCTOR. ° °1. A prescription for pain medication may be given to you upon discharge.  Take your pain medication as prescribed, if needed.  If narcotic pain medicine is not needed, then you may take acetaminophen (Tylenol) or ibuprofen (Advil) as needed. °2. Take your usually prescribed medications unless otherwise directed. °3. If you need a refill on your pain medication, please contact your pharmacy.  They will contact our office to request authorization. Prescriptions will not be filled after 5pm or on week-ends. °4. You should follow a light diet the first few days after arrival home, such as soup and crackers, etc.  Be sure to include lots of fluids daily. °5. Most patients will experience some swelling and bruising in the area of the incisions.  Ice packs will help.  Swelling and bruising can take several days to resolve.  °6. It is common to experience some constipation if taking pain medication after surgery.  Increasing fluid intake and taking a stool softener (such as Colace) will usually help or prevent this problem from occurring.  A mild laxative (Milk of Magnesia or Miralax) should be taken according to package instructions if there are no bowel movements after 48 hours. °7. Unless discharge instructions indicate otherwise, you may remove your bandages 24-48 hours after surgery, and you may shower at that time.  You may have steri-strips (small skin tapes) in place directly over the incision.  These strips should be left on the skin for 7-10 days.  If your surgeon used skin glue on the incision, you may shower in 24 hours.  The glue will flake off over the next 2-3 weeks.  Any sutures or  staples will be removed at the office during your follow-up visit. °8. ACTIVITIES:  You may resume regular (light) daily activities beginning the next day--such as daily self-care, walking, climbing stairs--gradually increasing activities as tolerated.  You may have sexual intercourse when it is comfortable.  Refrain from any heavy lifting or straining until approved by your doctor. °a. You may drive when you are no longer taking prescription pain medication, you can comfortably wear a seatbelt, and you can safely maneuver your car and apply brakes. °b. RETURN TO WORK:  __________________________________________________________ °9. You should see your doctor in the office for a follow-up appointment approximately 2-3 weeks after your surgery.  Make sure that you call for this appointment within a day or two after you arrive home to insure a convenient appointment time. °10. OTHER INSTRUCTIONS: __________________________________________________________________________________________________________________________ __________________________________________________________________________________________________________________________ °WHEN TO CALL YOUR DOCTOR: °1. Fever over 101.0 °2. Inability to urinate °3. Continued bleeding from incision. °4. Increased pain, redness, or drainage from the incision. °5. Increasing abdominal pain ° °The clinic staff is available to answer your questions during regular business hours.  Please don’t hesitate to call and ask to speak to one of the nurses for clinical concerns.  If you have a medical emergency, go to the nearest emergency room or call 911.  A surgeon from [facility] is always on call at the hospital. °[address], [address], [place], [place]  [address] ? [address] [address], [place], [place]   [address] °[phone] ? [phone] ? FAX [phone] °Web site:   [email or web address] °

## 2019-10-10 NOTE — Anesthesia Procedure Notes (Signed)
Procedure Name: Intubation Date/Time: 10/10/2019 2:57 PM Performed by: Mariaceleste Herrera T, CRNA Pre-anesthesia Checklist: Patient identified, Emergency Drugs available, Suction available and Patient being monitored Patient Re-evaluated:Patient Re-evaluated prior to induction Oxygen Delivery Method: Circle system utilized Preoxygenation: Pre-oxygenation with 100% oxygen Induction Type: IV induction and Rapid sequence Ventilation: Mask ventilation without difficulty Laryngoscope Size: Miller and 2 Grade View: Grade I Tube type: Oral Tube size: 7.0 mm Number of attempts: 1 Airway Equipment and Method: Patient positioned with wedge pillow and Stylet Placement Confirmation: ETT inserted through vocal cords under direct vision,  positive ETCO2 and breath sounds checked- equal and bilateral Secured at: 21 cm Tube secured with: Tape Dental Injury: Teeth and Oropharynx as per pre-operative assessment

## 2019-10-10 NOTE — Anesthesia Postprocedure Evaluation (Signed)
Anesthesia Post Note  Patient: TYLENE STAUNTON  Procedure(s) Performed: LAPAROSCOPIC CHOLECYSTECTOMY WITH INTRAOPERATIVE CHOLANGIOGRAM (N/A Abdomen)     Patient location during evaluation: PACU Anesthesia Type: General Level of consciousness: awake and alert Pain management: pain level controlled Vital Signs Assessment: post-procedure vital signs reviewed and stable Respiratory status: spontaneous breathing, nonlabored ventilation, respiratory function stable and patient connected to nasal cannula oxygen Cardiovascular status: blood pressure returned to baseline and stable Postop Assessment: no apparent nausea or vomiting Anesthetic complications: no    Last Vitals:  Vitals:   10/10/19 1705 10/10/19 1712  BP: (!) 161/93 (!) 145/90  Pulse: 69 69  Resp: 15 15  Temp: (!) 36.4 C   SpO2: 97% 97%    Last Pain:  Vitals:   10/10/19 1642  TempSrc:   PainSc: 9                  Ataya Murdy COKER

## 2019-10-11 ENCOUNTER — Encounter (HOSPITAL_COMMUNITY): Payer: Self-pay | Admitting: Surgery

## 2019-10-11 LAB — SURGICAL PATHOLOGY

## 2019-10-13 ENCOUNTER — Ambulatory Visit (INDEPENDENT_AMBULATORY_CARE_PROVIDER_SITE_OTHER): Payer: Medicaid Other | Admitting: Podiatry

## 2019-10-13 DIAGNOSIS — Z5329 Procedure and treatment not carried out because of patient's decision for other reasons: Secondary | ICD-10-CM

## 2019-10-13 NOTE — Progress Notes (Signed)
No show for appt. 

## 2019-10-13 NOTE — Progress Notes (Signed)
Subjective:  Patient ID: Jasmin Stephenson, female    DOB: 1971/09/25,  MRN: [id number]  Chief Complaint  Patient presents with  . Post-op Problem    Pt states same injury as previous visit; however previous visit pt left before she could be seen by the physician. Pt states interdigital 5th/4th toe pain on right foot, and had some drainage for about 2 weeks after the injury which occurred 1 month ago. Pt had hit her toes on a chest.    48 y.o. female presents with the above complaint.  States the injury occurred about a month ago.   Review of Systems: Negative except as noted in the HPI. Denies N/V/F/Ch.  Past Medical History:  Diagnosis Date  . Anemia   . Anxiety   . Arthritis    and tendonitis  . Asthma   . Breast calcification, right   . Cervical herniated disc   . Depression   . GERD (gastroesophageal reflux disease)   . Myocardial infarction ([place])    " Mild"  . Vitamin D deficiency     Current Outpatient Medications:  .  albuterol (PROVENTIL HFA;VENTOLIN HFA) 108 (90 Base) MCG/ACT inhaler, Inhale 2 puffs into the lungs every 4 (four) hours as needed for wheezing or shortness of breath (cough, shortness of breath or wheezing.)., Disp: 1 Inhaler, Rfl: 11 .  albuterol (PROVENTIL) (2.5 MG/3ML) 0.083% nebulizer solution, Take 3 mLs (2.5 mg total) by nebulization every 6 (six) hours as needed for wheezing or shortness of breath., Disp: 75 mL, Rfl: 12 .  fluticasone (FLONASE) 50 MCG/ACT nasal spray, Place 2 sprays into both nostrils daily. (Patient taking differently: Place 2 sprays into both nostrils daily as needed for allergies or rhinitis. ), Disp: 16 g, Rfl: 6 .  Respiratory Therapy Supplies (NEBULIZER/TUBING/MOUTHPIECE) KIT, 1 each by Does not apply route as needed., Disp: 1 each, Rfl: 0 .  oxyCODONE (OXY IR/ROXICODONE) 5 MG immediate release tablet, Take 1 tablet (5 mg total) by mouth every 6 (six) hours as needed for moderate pain, severe pain or breakthrough pain., Disp: 24  tablet, Rfl: 0  Social History   Tobacco Use  Smoking Status Current Every Day Smoker  . Packs/day: 1.00  . Years: 7.00  . Pack years: 7.00  . Types: Cigarettes  Smokeless Tobacco Never Used    Allergies  Allergen Reactions  . Penicillins Nausea And Vomiting and Rash    Has patient had a PCN reaction causing immediate rash, facial/tongue/throat swelling, SOB or lightheadedness with hypotension: Yes Has patient had a PCN reaction causing severe rash involving mucus membranes or skin necrosis: Yes Has patient had a PCN reaction that required hospitalization No Has patient had a PCN reaction occurring within the last 10 years: No If all of the above answers are "NO", then may proceed with Cephalosporin use.   Objective:  There were no vitals filed for this visit. There is no height or weight on file to calculate BMI. Constitutional Well developed. Well nourished.  Vascular Dorsalis pedis pulses palpable bilaterally. Posterior tibial pulses palpable bilaterally. Capillary refill normal to all digits.  No cyanosis or clubbing noted. Pedal hair growth normal.  Neurologic Normal speech. Oriented to person, place, and time. Epicritic sensation to light touch grossly present bilaterally.  Dermatologic Nails normal Skin interdigital space right fourth with slight laceration without warmth erythema signs of acute infection  Orthopedic: Normal joint ROM without pain or crepitus bilaterally. No visible deformities. No bony tenderness.   Radiographs: None Assessment:  1. Hammer toe of right foot   2. Laceration of lesser toe of right foot without foreign body present or damage to nail, initial encounter    Plan:  Patient was evaluated and treated and all questions answered.  Right fourth interspace laceration -Patient suffered an injury which led to slight laceration of the syndactylization site.  Discussed with the patient dressing the area daily with Betadine to keep the area  dry and clean.  I think the area will heal uneventfully without issue.  Return in about 1 month (around 10/16/2019).

## 2019-10-21 ENCOUNTER — Other Ambulatory Visit: Payer: Self-pay

## 2019-10-21 ENCOUNTER — Ambulatory Visit: Payer: Medicaid Other | Admitting: Podiatry

## 2019-10-21 DIAGNOSIS — M2041 Other hammer toe(s) (acquired), right foot: Secondary | ICD-10-CM | POA: Diagnosis not present

## 2019-10-21 DIAGNOSIS — S91114A Laceration without foreign body of right lesser toe(s) without damage to nail, initial encounter: Secondary | ICD-10-CM

## 2019-10-21 DIAGNOSIS — M779 Enthesopathy, unspecified: Secondary | ICD-10-CM

## 2019-10-21 DIAGNOSIS — M79671 Pain in right foot: Secondary | ICD-10-CM

## 2019-10-21 NOTE — Progress Notes (Signed)
Subjective:  Patient ID: Jasmin Stephenson, female    DOB: December 20, 1970,  MRN: [id number]  Chief Complaint  Patient presents with  . laseration    F/U Rt 4th interspace laceratrion Pt. states," it's worse, it hurts really bad as last time; lots of burning; 8/10 occasional burning pain." -w/ swelling -pt dneies redness/driange -pt states it's turning black Tx: neoposrin -pt denies N/V/?FHC     48 y.o. female presents with the above complaint.  States the area is not doing any better getting worse.  Would like to discuss other options.  Review of Systems: Negative except as noted in the HPI. Denies N/V/F/Ch.  Past Medical History:  Diagnosis Date  . Anemia   . Anxiety   . Arthritis    and tendonitis  . Asthma   . Breast calcification, right   . Cervical herniated disc   . Depression   . GERD (gastroesophageal reflux disease)   . Myocardial infarction ([place])    " Mild"  . Vitamin D deficiency     Current Outpatient Medications:  .  albuterol (PROVENTIL HFA;VENTOLIN HFA) 108 (90 Base) MCG/ACT inhaler, Inhale 2 puffs into the lungs every 4 (four) hours as needed for wheezing or shortness of breath (cough, shortness of breath or wheezing.)., Disp: 1 Inhaler, Rfl: 11 .  albuterol (PROVENTIL) (2.5 MG/3ML) 0.083% nebulizer solution, Take 3 mLs (2.5 mg total) by nebulization every 6 (six) hours as needed for wheezing or shortness of breath., Disp: 75 mL, Rfl: 12 .  fluticasone (FLONASE) 50 MCG/ACT nasal spray, Place 2 sprays into both nostrils daily. (Patient taking differently: Place 2 sprays into both nostrils daily as needed for allergies or rhinitis. ), Disp: 16 g, Rfl: 6 .  oxyCODONE (OXY IR/ROXICODONE) 5 MG immediate release tablet, Take 1 tablet (5 mg total) by mouth every 6 (six) hours as needed for moderate pain, severe pain or breakthrough pain., Disp: 24 tablet, Rfl: 0 .  Respiratory Therapy Supplies (NEBULIZER/TUBING/MOUTHPIECE) KIT, 1 each by Does not apply route as needed., Disp: 1  each, Rfl: 0  Social History   Tobacco Use  Smoking Status Current Every Day Smoker  . Packs/day: 1.00  . Years: 7.00  . Pack years: 7.00  . Types: Cigarettes  Smokeless Tobacco Never Used    Allergies  Allergen Reactions  . Penicillins Nausea And Vomiting and Rash    Has patient had a PCN reaction causing immediate rash, facial/tongue/throat swelling, SOB or lightheadedness with hypotension: Yes Has patient had a PCN reaction causing severe rash involving mucus membranes or skin necrosis: Yes Has patient had a PCN reaction that required hospitalization No Has patient had a PCN reaction occurring within the last 10 years: No If all of the above answers are "NO", then may proceed with Cephalosporin use.   Objective:  There were no vitals filed for this visit. There is no height or weight on file to calculate BMI. Constitutional Well developed. Well nourished.  Vascular Dorsalis pedis pulses palpable bilaterally. Posterior tibial pulses palpable bilaterally. Capillary refill normal to all digits.  No cyanosis or clubbing noted. Pedal hair growth normal.  Neurologic Normal speech. Oriented to person, place, and time. Epicritic sensation to light touch grossly present bilaterally.  Dermatologic Nails normal Skin interdigital space right fourth with slight laceration without warmth erythema signs of acute infection  Orthopedic: Normal joint ROM without pain or crepitus bilaterally. No visible deformities. No bony tenderness.   Radiographs: None Assessment:   1. Hammer toe of right foot  2. Laceration of lesser toe of right foot without foreign body present or damage to nail, initial encounter   3. Capsulitis   4. Right foot pain    Plan:  Patient was evaluated and treated and all questions answered.  Right fourth interspace laceration -Discussed with patient that due to consistent pain and laceration at the area the previous palpation would benefit from repair of the  syndactylization.  Patient agreed and would like to proceed with surgery.  Risk benefits and alternatives discussed no guarantees given.  We will plan with syndactylization surgery -Patient has failed all conservative therapy and wishes to proceed with surgical intervention. All risks, benefits, and alternatives discussed with patient. No guarantees given. Consent reviewed and signed by patient. -Planned procedures: Right fourth interspace repair syndactylization No follow-ups on file.  

## 2019-10-24 ENCOUNTER — Telehealth: Payer: Self-pay | Admitting: *Deleted

## 2019-10-24 NOTE — Telephone Encounter (Signed)
"  I'm a patient of [facility].  I was told to give you a phone call to set me a day for a surgery appointment.  If you could, please give me a call back.  I'd greatly appreciate it."

## 2019-10-31 NOTE — Telephone Encounter (Signed)
"  I called you about a week ago and you never called me back."  I'm sorry, I thought I spoke to you.  "No, they told me to call you about my surgery."  I have you scheduled.  "You have me scheduled for December 2?"  Yes, I do.  "Well what time am I supposed to be there?  I don't know none of that stuff."  Someone from the surgical center will give you a call a day or two prior to your surgery date and will give you your arrival time.  The reason they wait so late is because they may have cancellations and don't want to repeatedly move patients around.  They also like to do children and diabetic patients first.  "I see.  Thank you."

## 2019-11-06 DIAGNOSIS — L659 Nonscarring hair loss, unspecified: Secondary | ICD-10-CM | POA: Diagnosis not present

## 2019-11-06 DIAGNOSIS — L658 Other specified nonscarring hair loss: Secondary | ICD-10-CM | POA: Diagnosis not present

## 2019-11-06 DIAGNOSIS — L299 Pruritus, unspecified: Secondary | ICD-10-CM | POA: Diagnosis not present

## 2019-11-06 DIAGNOSIS — L089 Local infection of the skin and subcutaneous tissue, unspecified: Secondary | ICD-10-CM | POA: Diagnosis not present

## 2019-11-15 ENCOUNTER — Encounter: Payer: Self-pay | Admitting: Podiatry

## 2019-11-15 ENCOUNTER — Other Ambulatory Visit: Payer: Self-pay | Admitting: Podiatry

## 2019-11-15 DIAGNOSIS — M25571 Pain in right ankle and joints of right foot: Secondary | ICD-10-CM | POA: Diagnosis not present

## 2019-11-15 DIAGNOSIS — D492 Neoplasm of unspecified behavior of bone, soft tissue, and skin: Secondary | ICD-10-CM

## 2019-11-15 DIAGNOSIS — L852 Keratosis punctata (palmaris et plantaris): Secondary | ICD-10-CM

## 2019-11-15 DIAGNOSIS — M216X2 Other acquired deformities of left foot: Secondary | ICD-10-CM | POA: Diagnosis not present

## 2019-11-15 DIAGNOSIS — M2041 Other hammer toe(s) (acquired), right foot: Secondary | ICD-10-CM | POA: Diagnosis not present

## 2019-11-15 DIAGNOSIS — Q7031 Webbed toes, right foot: Secondary | ICD-10-CM | POA: Diagnosis not present

## 2019-11-15 MED ORDER — CLINDAMYCIN HCL 150 MG PO CAPS: 150.0000 mg | ORAL_CAPSULE | Freq: Two times a day (BID) | ORAL | 0 refills | Status: DC

## 2019-11-15 MED ORDER — OXYCODONE-ACETAMINOPHEN 5-325 MG PO TABS: 1.0000 | ORAL_TABLET | ORAL | 0 refills | Status: DC | PRN

## 2019-11-15 NOTE — Progress Notes (Signed)
Rx sent to pharmacy for outpatient surgery. °

## 2019-11-16 ENCOUNTER — Telehealth: Payer: Self-pay

## 2019-11-16 NOTE — Telephone Encounter (Signed)
POST OP CALL-    1) General condition stated by the patient:   2) Is the pt having pain?   3) Pain score:   4) Has the pt taken Rx'd pain medication, regularly or PRN?   5) Is the pain medication giving relief?  6) Any fever, chills, nausea, or vomiting, shortness of breath or tightness in calf?  7) Is the bandage clean, dry and intact?  8) Is there excessive tightness, bleeding or drainage coming through the bandage?  9) Did you understand all of the post op instruction sheet given?  10) Any questions or concerns regarding post op care/recovery?    Confirmed POV appointment with patient    Pt did not answer. Left voicemail stating if the pt had any questions or concerns to give the office a call, otherwise we would see the pt at their next scheduled post op appointment.

## 2019-11-24 ENCOUNTER — Other Ambulatory Visit: Payer: Self-pay

## 2019-11-24 ENCOUNTER — Ambulatory Visit (INDEPENDENT_AMBULATORY_CARE_PROVIDER_SITE_OTHER): Payer: Medicaid Other | Admitting: Podiatry

## 2019-11-24 DIAGNOSIS — Z9889 Other specified postprocedural states: Secondary | ICD-10-CM

## 2019-11-24 DIAGNOSIS — M2041 Other hammer toe(s) (acquired), right foot: Secondary | ICD-10-CM

## 2019-12-01 ENCOUNTER — Encounter: Payer: Medicaid Other | Admitting: Podiatry

## 2019-12-14 ENCOUNTER — Encounter: Payer: Medicaid Other | Admitting: Podiatry

## 2019-12-17 NOTE — Progress Notes (Signed)
Subjective:  Patient ID: Jasmin Stephenson, female    DOB: [birth year],  MRN: [id number]  Chief Complaint  Patient presents with  . Follow-up    Surgical f/u - R foot, interdigital (4-5). Pt stated, "I feel so much better. Pain = 2-3/10".     DOS: 11/15/2019 Procedure: Webbing procedure right fourth fifth toes  49 y.o. female returns for post-op check.  Feels much better only having a little bit of pain controlled well with medication  Review of Systems: Negative except as noted in the HPI. Denies N/V/F/Ch.  Past Medical History:  Diagnosis Date  . Anemia   . Anxiety   . Arthritis    and tendonitis  . Asthma   . Breast calcification, right   . Cervical herniated disc   . Depression   . GERD (gastroesophageal reflux disease)   . Myocardial infarction ([place])    " Mild"  . Vitamin D deficiency     Current Outpatient Medications:  .  albuterol (PROVENTIL HFA;VENTOLIN HFA) 108 (90 Base) MCG/ACT inhaler, Inhale 2 puffs into the lungs every 4 (four) hours as needed for wheezing or shortness of breath (cough, shortness of breath or wheezing.)., Disp: 1 Inhaler, Rfl: 11 .  albuterol (PROVENTIL) (2.5 MG/3ML) 0.083% nebulizer solution, Take 3 mLs (2.5 mg total) by nebulization every 6 (six) hours as needed for wheezing or shortness of breath., Disp: 75 mL, Rfl: 12 .  clindamycin (CLEOCIN) 150 MG capsule, Take 1 capsule (150 mg total) by mouth 2 (two) times daily., Disp: 14 capsule, Rfl: 0 .  Fluocinolone Acetonide Body 0.01 % OIL, Apply to scalp 2-3 times per week for itch., Disp: , Rfl:  .  fluticasone (FLONASE) 50 MCG/ACT nasal spray, Place 2 sprays into both nostrils daily. (Patient taking differently: Place 2 sprays into both nostrils daily as needed for allergies or rhinitis. ), Disp: 16 g, Rfl: 6 .  oxyCODONE (OXY IR/ROXICODONE) 5 MG immediate release tablet, Take 1 tablet (5 mg total) by mouth every 6 (six) hours as needed for moderate pain, severe pain or breakthrough pain., Disp: 24  tablet, Rfl: 0 .  oxyCODONE-acetaminophen (PERCOCET) 5-325 MG tablet, Take 1 tablet by mouth every 4 (four) hours as needed for severe pain., Disp: 20 tablet, Rfl: 0 .  Respiratory Therapy Supplies (NEBULIZER/TUBING/MOUTHPIECE) KIT, 1 each by Does not apply route as needed., Disp: 1 each, Rfl: 0  Social History   Tobacco Use  Smoking Status Current Every Day Smoker  . Packs/day: 1.00  . Years: 7.00  . Pack years: 7.00  . Types: Cigarettes  Smokeless Tobacco Never Used    Allergies  Allergen Reactions  . Penicillins Nausea And Vomiting and Rash    Has patient had a PCN reaction causing immediate rash, facial/tongue/throat swelling, SOB or lightheadedness with hypotension: Yes Has patient had a PCN reaction causing severe rash involving mucus membranes or skin necrosis: Yes Has patient had a PCN reaction that required hospitalization No Has patient had a PCN reaction occurring within the last 10 years: No If all of the above answers are "NO", then may proceed with Cephalosporin use.   Objective:  There were no vitals filed for this visit. There is no height or weight on file to calculate BMI. Constitutional Well developed. Well nourished.  Vascular Foot warm and well perfused. Capillary refill normal to all digits.   Neurologic Normal speech. Oriented to person, place, and time. Epicritic sensation to light touch grossly present bilaterally.  Dermatologic Skin healing well without signs  of infection. Skin edges well coapted without signs of infection.  Orthopedic: Tenderness to palpation noted about the surgical site.   Radiographs: none Assessment:   1. Post-operative state   2. Hammer toe of right foot    Plan:  Patient was evaluated and treated and all questions answered.  S/p foot surgery right -Progressing as expected post-operatively. -XR: none -WB Status: WBAT in surgical shoe -Sutures: Intact, absorbable. -Medications: None refilled today -Foot redressed.   No follow-ups on file.

## 2019-12-21 ENCOUNTER — Other Ambulatory Visit: Payer: Self-pay

## 2019-12-21 ENCOUNTER — Ambulatory Visit (INDEPENDENT_AMBULATORY_CARE_PROVIDER_SITE_OTHER): Payer: Medicaid Other | Admitting: Podiatry

## 2019-12-21 DIAGNOSIS — Z9889 Other specified postprocedural states: Secondary | ICD-10-CM

## 2019-12-21 DIAGNOSIS — M2041 Other hammer toe(s) (acquired), right foot: Secondary | ICD-10-CM

## 2019-12-21 NOTE — Progress Notes (Signed)
  Subjective:  Patient ID: Jasmin Stephenson, female    DOB: 1971/10/25,  MRN: [id number]  Chief Complaint  Patient presents with  . Routine Post Op    POV#2 DOS 11/15/2019 WEBBING PROCEDURE RT    DOS: 11/15/2019 Procedure: Right foot webbing procedure  49 y.o. female presents with the above complaint. History confirmed with patient. Doing much better but has a lot of dryness and cracking to the foot.  Objective:  Physical Exam: no tenderness at the surgical site, no edema noted and calf supple, nontender. Incision: healing well, no significant drainage  Assessment:   1. Post-operative state   2. Hammer toe of right foot     Plan:  Patient was evaluated and treated and all questions answered.  Post-operative State -Ok to start showering at this time. Advised they cannot soak. -WBAT in Surgical shoe -Transition to normal shoegear in 2 weeks.  -Sutures absorbable, no removal.  Return in about 1 month (around 01/21/2020).

## 2020-01-03 ENCOUNTER — Ambulatory Visit: Payer: Medicaid Other | Admitting: Family Medicine

## 2020-01-12 ENCOUNTER — Ambulatory Visit: Payer: Medicaid Other | Admitting: Family Medicine

## 2020-01-18 ENCOUNTER — Other Ambulatory Visit: Payer: Self-pay

## 2020-01-18 ENCOUNTER — Ambulatory Visit (INDEPENDENT_AMBULATORY_CARE_PROVIDER_SITE_OTHER): Payer: Medicaid Other | Admitting: Podiatry

## 2020-01-18 DIAGNOSIS — D492 Neoplasm of unspecified behavior of bone, soft tissue, and skin: Secondary | ICD-10-CM

## 2020-01-18 DIAGNOSIS — Q828 Other specified congenital malformations of skin: Secondary | ICD-10-CM

## 2020-01-18 DIAGNOSIS — L989 Disorder of the skin and subcutaneous tissue, unspecified: Secondary | ICD-10-CM

## 2020-01-18 NOTE — Patient Instructions (Signed)
Pre-Operative Instructions  Congratulations, you have decided to take an important step towards improving your quality of life.  You can be assured that the doctors and staff at [facility] will be with you every step of the way.  Here are some important things you should know:  1. Plan to be at the surgery center/hospital at least 1 (one) hour prior to your scheduled time, unless otherwise directed by the surgical center/hospital staff.  You must have a responsible adult accompany you, remain during the surgery and drive you home.  Make sure you have directions to the surgical center/hospital to ensure you arrive on time. 2. If you are having surgery at [facility] or [facility] hospitals, you will need a copy of your medical history and physical form from your family physician within one month prior to the date of surgery. We will give you a form for your primary physician to complete.  3. We make every effort to accommodate the date you request for surgery.  However, there are times where surgery dates or times have to be moved.  We will contact you as soon as possible if a change in schedule is required.   4. No aspirin/ibuprofen for one week before surgery.  If you are on aspirin, any non-steroidal anti-inflammatory medications (Mobic, Aleve, Ibuprofen) should not be taken seven (7) days prior to your surgery.  You make take Tylenol for pain prior to surgery.  5. Medications - If you are taking daily heart and blood pressure medications, seizure, reflux, allergy, asthma, anxiety, pain or diabetes medications, make sure you notify the surgery center/hospital before the day of surgery so they can tell you which medications you should take or avoid the day of surgery. 6. No food or drink after midnight the night before surgery unless directed otherwise by surgical center/hospital staff. 7. No alcoholic beverages 24-hours prior to surgery.  No smoking 24-hours prior or 24-hours after  surgery. 8. Wear loose pants or shorts. They should be loose enough to fit over bandages, boots, and casts. 9. Don't wear slip-on shoes. Sneakers are preferred. 10. Bring your boot with you to the surgery center/hospital.  Also bring crutches or a walker if your physician has prescribed it for you.  If you do not have this equipment, it will be provided for you after surgery. 11. If you have not been contacted by the surgery center/hospital by the day before your surgery, call to confirm the date and time of your surgery. 12. Leave-time from work may vary depending on the type of surgery you have.  Appropriate arrangements should be made prior to surgery with your employer. 13. Prescriptions will be provided immediately following surgery by your doctor.  Fill these as soon as possible after surgery and take the medication as directed. Pain medications will not be refilled on weekends and must be approved by the doctor. 14. Remove nail polish on the operative foot and avoid getting pedicures prior to surgery. 15. Wash the night before surgery.  The night before surgery wash the foot and leg well with water and the antibacterial soap provided. Be sure to pay special attention to beneath the toenails and in between the toes.  Wash for at least three (3) minutes. Rinse thoroughly with water and dry well with a towel.  Perform this wash unless told not to do so by your physician.  Enclosed: 1 Ice pack (please put in freezer the night before surgery)   1 Hibiclens skin cleaner     Pre-op instructions  If you have any questions regarding the instructions, please do not hesitate to call our office.  [place]: [address], [place], [place] [address] -- [phone]  [place]: [address] [place], [place] [address] -- [phone]  [place]: [address], [place], [place] [address] -- [phone]   Website: [email or web address] 

## 2020-02-05 DIAGNOSIS — L089 Local infection of the skin and subcutaneous tissue, unspecified: Secondary | ICD-10-CM | POA: Diagnosis not present

## 2020-02-05 DIAGNOSIS — L669 Cicatricial alopecia, unspecified: Secondary | ICD-10-CM | POA: Diagnosis not present

## 2020-02-05 DIAGNOSIS — L658 Other specified nonscarring hair loss: Secondary | ICD-10-CM | POA: Diagnosis not present

## 2020-02-07 ENCOUNTER — Other Ambulatory Visit: Payer: Self-pay | Admitting: Podiatry

## 2020-02-07 ENCOUNTER — Encounter: Payer: Self-pay | Admitting: Podiatry

## 2020-02-07 DIAGNOSIS — M25572 Pain in left ankle and joints of left foot: Secondary | ICD-10-CM | POA: Diagnosis not present

## 2020-02-07 DIAGNOSIS — B079 Viral wart, unspecified: Secondary | ICD-10-CM | POA: Diagnosis not present

## 2020-02-07 DIAGNOSIS — D492 Neoplasm of unspecified behavior of bone, soft tissue, and skin: Secondary | ICD-10-CM | POA: Diagnosis not present

## 2020-02-07 MED ORDER — CLINDAMYCIN HCL 150 MG PO CAPS: 150.0000 mg | ORAL_CAPSULE | Freq: Two times a day (BID) | ORAL | 0 refills | Status: DC

## 2020-02-07 MED ORDER — OXYCODONE-ACETAMINOPHEN 5-325 MG PO TABS: 1.0000 | ORAL_TABLET | ORAL | 0 refills | Status: DC | PRN

## 2020-02-07 NOTE — Progress Notes (Signed)
Rx sent to pharmacy for outpatient surgery. °

## 2020-02-08 ENCOUNTER — Telehealth: Payer: Self-pay | Admitting: Family Medicine

## 2020-02-08 NOTE — Telephone Encounter (Signed)
Pt called and reminded of appointment 

## 2020-02-09 ENCOUNTER — Ambulatory Visit: Payer: Medicaid Other | Admitting: Family Medicine

## 2020-02-13 DIAGNOSIS — R140 Abdominal distension (gaseous): Secondary | ICD-10-CM | POA: Diagnosis not present

## 2020-02-13 DIAGNOSIS — R1310 Dysphagia, unspecified: Secondary | ICD-10-CM | POA: Diagnosis not present

## 2020-02-13 DIAGNOSIS — K219 Gastro-esophageal reflux disease without esophagitis: Secondary | ICD-10-CM | POA: Diagnosis not present

## 2020-02-13 DIAGNOSIS — R1011 Right upper quadrant pain: Secondary | ICD-10-CM | POA: Diagnosis not present

## 2020-02-15 ENCOUNTER — Ambulatory Visit (INDEPENDENT_AMBULATORY_CARE_PROVIDER_SITE_OTHER): Payer: Medicaid Other | Admitting: Podiatry

## 2020-02-15 ENCOUNTER — Other Ambulatory Visit: Payer: Self-pay

## 2020-02-15 VITALS — Temp 98.4°F

## 2020-02-15 DIAGNOSIS — Q828 Other specified congenital malformations of skin: Secondary | ICD-10-CM

## 2020-02-15 DIAGNOSIS — Z9889 Other specified postprocedural states: Secondary | ICD-10-CM

## 2020-02-15 NOTE — Progress Notes (Signed)
  Subjective:  Patient ID: Jasmin Stephenson, female    DOB: 03/17/1971,  MRN: [id number]  Chief Complaint  Patient presents with  . Routine Post Op    DOS 12.02.2020 WEBBING PROCEDURE RT. Pt states healing well, denies any pain or concerns.   DOS: 11/15/2019 Procedure: Right foot webbing procedure  49 y.o. female presents with the above complaint. History confirmed with patient. Doing very well no pain in her toes from the surgery pleased with results.  Complains of left foot pain in the from skin lesions  Objective:  Physical Exam: no tenderness at the surgical site, no edema noted and calf supple, nontender. Incision: well healed  Punctate keratoses bilateral feet with pain to palpation  Assessment:   1. Porokeratosis   2. Benign skin lesion     Plan:  Patient was evaluated and treated and all questions answered.  Post-operative State -Doing well in normal shoegear without issue  -Discussed removal of the lesions. Plan for removal from the lesion from the non-weightbearing area of the distal/medial 1st MPJ and left heel. Discussed risks of recurrence. -Patient has failed all conservative therapy and wishes to proceed with surgical intervention. All risks, benefits, and alternatives discussed with patient. No guarantees given. Consent reviewed and signed by patient. -Planned procedures: left heel removal of lesion, left distalmedial 1st MPJ removal of lesion    No follow-ups on file.

## 2020-02-22 ENCOUNTER — Other Ambulatory Visit: Payer: Self-pay | Admitting: Gastroenterology

## 2020-02-22 ENCOUNTER — Encounter: Payer: Medicaid Other | Admitting: Podiatry

## 2020-02-22 DIAGNOSIS — K219 Gastro-esophageal reflux disease without esophagitis: Secondary | ICD-10-CM

## 2020-02-22 DIAGNOSIS — R1011 Right upper quadrant pain: Secondary | ICD-10-CM

## 2020-02-22 DIAGNOSIS — R140 Abdominal distension (gaseous): Secondary | ICD-10-CM

## 2020-02-23 ENCOUNTER — Other Ambulatory Visit: Payer: Self-pay

## 2020-02-23 ENCOUNTER — Ambulatory Visit (INDEPENDENT_AMBULATORY_CARE_PROVIDER_SITE_OTHER): Payer: Medicaid Other | Admitting: Podiatry

## 2020-02-23 VITALS — Temp 98.7°F

## 2020-02-23 DIAGNOSIS — L989 Disorder of the skin and subcutaneous tissue, unspecified: Secondary | ICD-10-CM

## 2020-02-23 DIAGNOSIS — Q828 Other specified congenital malformations of skin: Secondary | ICD-10-CM

## 2020-02-23 NOTE — Progress Notes (Signed)
  Subjective:  Patient ID: Jasmin Stephenson, female    DOB: Feb 17, 1971,  MRN: [id number]  Chief Complaint  Patient presents with  . Routine Post Op    POV#2 DOS 2.24.2021 EXCISION OF BENIGN SKIN LESION X2 LEFT FOOT. Pt states healing well, no concerns. "I stopped wearing my surgical shoe because I have to climb 3 flights of stairs".    DOS: 02/07/20 Procedure: removal of lesions left foot/heel   49 y.o. female presents with the above complaint. History confirmed with patient. Doing well. Wearing normal shoegear  Objective:  Physical Exam: tenderness at the surgical site, local edema noted and calf supple, nontender. Incision: healing well, no significant drainage, no significant erythema  Assessment:   1. Porokeratosis   2. Benign skin lesion     Plan:  Patient was evaluated and treated and all questions answered.  Post-operative State -Sutures removed -Ok to start showering at this time. Advised they cannot soak. -Dressing applied consisting of silvadene -WBAT in Surgical shoe  Return in about 3 weeks (around 03/15/2020) for post-op check.

## 2020-02-23 NOTE — Progress Notes (Signed)
  Subjective:  Patient ID: Jasmin Stephenson, female    DOB: 04-Dec-1971,  MRN: [id number]  Chief Complaint  Patient presents with  . Post-op Follow-up    DOS: 02/07/2020. Skin lesions removed - L foot. Pt stated, "Pain is 3-4/10 most of the time, but that's an improvement. I take oxycodone when I need it".    DOS: 02/07/20 Procedure: Removal of skin lesions left  49 y.o. female presents with the above complaint. History confirmed with patient.   Objective:  Physical Exam: tenderness at the surgical site, local edema noted and calf supple, nontender. Incision: healing well, no significant drainage, no dehiscence, no significant erythema     Assessment:   1. Porokeratosis   2. Post-operative state     Plan:  Patient was evaluated and treated and all questions answered.  Post-operative State -Dressing applied consisting of sterile gauze, kerlix and ACE bandage -WBAT in CAM boot  No follow-ups on file.

## 2020-02-27 ENCOUNTER — Telehealth: Payer: Self-pay | Admitting: Podiatry

## 2020-02-27 NOTE — Telephone Encounter (Signed)
Left voicemail that I'm putting her medical records up front to pick up at her convenience. Told her to call us if she has any questions.

## 2020-03-04 ENCOUNTER — Ambulatory Visit
Admission: RE | Admit: 2020-03-04 | Discharge: 2020-03-04 | Disposition: A | Discharge status: Home / Self Care | Payer: Medicaid Other | Source: Ambulatory Visit | Attending: Gastroenterology | Admitting: Gastroenterology

## 2020-03-04 DIAGNOSIS — K449 Diaphragmatic hernia without obstruction or gangrene: Secondary | ICD-10-CM | POA: Diagnosis not present

## 2020-03-04 DIAGNOSIS — K219 Gastro-esophageal reflux disease without esophagitis: Secondary | ICD-10-CM | POA: Diagnosis not present

## 2020-03-04 DIAGNOSIS — R1011 Right upper quadrant pain: Secondary | ICD-10-CM

## 2020-03-04 DIAGNOSIS — R140 Abdominal distension (gaseous): Secondary | ICD-10-CM

## 2020-03-07 ENCOUNTER — Encounter: Payer: Medicaid Other | Admitting: Podiatry

## 2020-03-18 DIAGNOSIS — Z23 Encounter for immunization: Secondary | ICD-10-CM | POA: Diagnosis not present

## 2020-03-28 ENCOUNTER — Ambulatory Visit (INDEPENDENT_AMBULATORY_CARE_PROVIDER_SITE_OTHER): Payer: Medicaid Other | Admitting: Podiatry

## 2020-03-28 ENCOUNTER — Other Ambulatory Visit: Payer: Self-pay

## 2020-03-28 ENCOUNTER — Telehealth: Payer: Self-pay | Admitting: Podiatry

## 2020-03-28 VITALS — Temp 97.9°F

## 2020-03-28 DIAGNOSIS — Q828 Other specified congenital malformations of skin: Secondary | ICD-10-CM

## 2020-03-28 DIAGNOSIS — L989 Disorder of the skin and subcutaneous tissue, unspecified: Secondary | ICD-10-CM

## 2020-03-28 NOTE — Telephone Encounter (Signed)
Patient has medicaid and it does not pay for Laser for a wart. Patient wanted to know what should she do now.

## 2020-03-28 NOTE — Progress Notes (Signed)
  Subjective:  Patient ID: Jasmin Stephenson, female    DOB: 04/13/1971,  MRN: [id number]  Chief Complaint  Patient presents with  . Routine Post Op    POV#3 DOS 02/07/2020 EXCISION OF BENIGN SKIN LESION X2 LEFT FOOT. Pt states the surgical site is painful. Denies fever/chills/nausea/vomiting. Denies drainage.    DOS: 02/07/20 Procedure: removal of lesions left foot/heel   49 y.o. female presents with the above complaint. History confirmed with patient. Doing well. Wearing normal shoegear  Objective:  Physical Exam: tenderness at the surgical site, local edema noted and calf supple, nontender. Punctate keratosis at the left heel heel  Assessment:   1. Porokeratosis   2. Benign skin lesion     Plan:  Patient was evaluated and treated and all questions answered.  Post-operative State -She still having pain at the heel lesion and she has multiple other punctate keratoses.  Her biopsies for these lesions did come back as verruca so it may be beneficial to trial laser therapy.  We will have patient follow-up for laser therapy.  No follow-ups on file.

## 2020-03-29 NOTE — Telephone Encounter (Signed)
Jocelyn Lamer this shouldn't be an issue correct because it is stil [address] correct?

## 2020-04-15 DIAGNOSIS — Z23 Encounter for immunization: Secondary | ICD-10-CM | POA: Diagnosis not present

## 2020-04-19 ENCOUNTER — Other Ambulatory Visit: Payer: Medicaid Other

## 2020-05-17 ENCOUNTER — Ambulatory Visit (INDEPENDENT_AMBULATORY_CARE_PROVIDER_SITE_OTHER): Payer: Medicaid Other | Admitting: Podiatry

## 2020-05-17 ENCOUNTER — Other Ambulatory Visit: Payer: Self-pay

## 2020-05-17 ENCOUNTER — Telehealth: Payer: Self-pay | Admitting: *Deleted

## 2020-05-17 DIAGNOSIS — B079 Viral wart, unspecified: Secondary | ICD-10-CM

## 2020-05-17 DIAGNOSIS — D492 Neoplasm of unspecified behavior of bone, soft tissue, and skin: Secondary | ICD-10-CM

## 2020-05-17 DIAGNOSIS — I739 Peripheral vascular disease, unspecified: Secondary | ICD-10-CM

## 2020-05-17 NOTE — Telephone Encounter (Signed)
[employer] - Medicaid requires clinicals for review prior to pre-cert of [id number], Case: [id number]. Faxed clinicals and orders to [place].

## 2020-05-17 NOTE — Progress Notes (Signed)
  Subjective:  Patient ID: Jasmin Stephenson, female    DOB: 1971-10-19,  MRN: [id number]  Chief Complaint  Patient presents with  . Plantar Warts    Beginning laser therapy today. Pt has no new concerns but states her warts are painful.   49 y.o. female presents with the above complaint. History confirmed with patient.  Complains of right thigh claudication after about 30 minutes of walking. Active smoker.  Objective:  Physical Exam: warm, good capillary refill, no trophic changes or ulcerative lesions, normal DP and PT pulses and normal sensory exam. Multiple punctate painful lesions bilateral foot  Assessment:   1. Verruca   2. Intermittent claudication ([place])      Plan:  Patient was evaluated and treated and all questions answered.  Verruca -Destroyed as below  Procedure: Destruction of Lesion Location: bilateral foot Anesthesia: none Instrumentation: 15 blade, YAG laser Technique: Debridement of lesion , followed by several passes of Yag laser to destroy lesion.  Claudication Right Thigh -Order vascular studies  No follow-ups on file.

## 2020-05-17 NOTE — Telephone Encounter (Signed)
-----   Message from Evelina Bucy, DPM sent at 05/17/2020  1:55 PM EDT ----- Can we order arterial studies for claudication right thigh

## 2020-05-20 NOTE — Telephone Encounter (Signed)
[employer] - MEDICAID AUTHORIZATION:  [id number] FOR ABI WITH TBI [phone], EFFECTIVE: 05/17/2020, END: 11/13/2020. Faxed to [facility] with orders.

## 2020-05-31 ENCOUNTER — Ambulatory Visit (HOSPITAL_COMMUNITY)
Admission: RE | Admit: 2020-05-31 | Discharge: 2020-05-31 | Disposition: A | Discharge status: Home / Self Care | Payer: Medicaid Other | Source: Ambulatory Visit | Attending: Podiatry | Admitting: Podiatry

## 2020-05-31 ENCOUNTER — Other Ambulatory Visit: Payer: Self-pay

## 2020-05-31 DIAGNOSIS — I739 Peripheral vascular disease, unspecified: Secondary | ICD-10-CM | POA: Diagnosis not present

## 2020-06-06 ENCOUNTER — Telehealth: Payer: Self-pay | Admitting: Podiatry

## 2020-06-06 NOTE — Telephone Encounter (Signed)
Noted  

## 2020-06-06 NOTE — Telephone Encounter (Signed)
Called patient to go over results. Patient did not answer. Second attempt.

## 2020-06-10 DIAGNOSIS — F4312 Post-traumatic stress disorder, chronic: Secondary | ICD-10-CM | POA: Diagnosis not present

## 2020-06-10 NOTE — Telephone Encounter (Signed)
Patient calling back returning doctors calls regarding lab results.   Please give patient a call.

## 2020-06-10 NOTE — Telephone Encounter (Signed)
Spoke to patient

## 2020-06-14 ENCOUNTER — Other Ambulatory Visit: Payer: Medicaid Other

## 2020-06-28 ENCOUNTER — Ambulatory Visit: Payer: Medicaid Other | Admitting: *Deleted

## 2020-06-28 ENCOUNTER — Other Ambulatory Visit: Payer: Self-pay

## 2020-06-28 ENCOUNTER — Telehealth: Payer: Self-pay | Admitting: *Deleted

## 2020-06-28 ENCOUNTER — Telehealth: Payer: Self-pay | Admitting: Podiatry

## 2020-06-28 DIAGNOSIS — B079 Viral wart, unspecified: Secondary | ICD-10-CM

## 2020-06-28 DIAGNOSIS — D492 Neoplasm of unspecified behavior of bone, soft tissue, and skin: Secondary | ICD-10-CM | POA: Diagnosis not present

## 2020-06-28 DIAGNOSIS — I739 Peripheral vascular disease, unspecified: Secondary | ICD-10-CM

## 2020-06-28 NOTE — Telephone Encounter (Signed)
Pt has been referred to [facility] but after speaking with them was told we needed to update her referral to state that it is a STAT referral. Pt has possible blockage and needs the testing done asap.

## 2020-06-28 NOTE — Telephone Encounter (Signed)
Faxed required form, clinicals and demographics to [facility]. 

## 2020-06-28 NOTE — Progress Notes (Signed)
Patient presents today for laser treatment for plantar warts on the bilateral foot. There are multiple lesions.  Dr. March Rummage patient.  All other systems are negative.  Lesions were debrided superficially. Laser therapy was administered to the bilateral foot. The patient tolerated the treatment well. All safety precautions were in place.   Dr. March Rummage did evaluate and trim some of the areas today. He would like to see her in 6 weeks to discuss her bunions. No follow ups with me at this time.

## 2020-07-01 NOTE — Telephone Encounter (Signed)
Changed referral in Epic to Urgent and refaxed entire referral packet previously faxed.

## 2020-07-01 NOTE — Addendum Note (Signed)
Addended by: Harriett Sine D on: 07/01/2020 01:09 PM   Modules accepted: Orders

## 2020-07-09 ENCOUNTER — Other Ambulatory Visit: Payer: Self-pay

## 2020-07-09 DIAGNOSIS — I739 Peripheral vascular disease, unspecified: Secondary | ICD-10-CM

## 2020-07-15 ENCOUNTER — Ambulatory Visit (HOSPITAL_COMMUNITY)
Admission: RE | Admit: 2020-07-15 | Discharge: 2020-07-15 | Disposition: A | Discharge status: Home / Self Care | Payer: Medicaid Other | Source: Ambulatory Visit | Attending: Vascular Surgery | Admitting: Vascular Surgery

## 2020-07-15 ENCOUNTER — Encounter: Payer: Self-pay | Admitting: Surgery

## 2020-07-15 ENCOUNTER — Other Ambulatory Visit: Payer: Self-pay

## 2020-07-15 ENCOUNTER — Ambulatory Visit (INDEPENDENT_AMBULATORY_CARE_PROVIDER_SITE_OTHER): Payer: Medicaid Other | Admitting: Surgery

## 2020-07-15 VITALS — BP 137/92 | HR 69 | Temp 97.8°F | Resp 20 | Ht 65.0 in | Wt 200.0 lb

## 2020-07-15 DIAGNOSIS — I739 Peripheral vascular disease, unspecified: Secondary | ICD-10-CM | POA: Diagnosis not present

## 2020-07-15 DIAGNOSIS — I6521 Occlusion and stenosis of right carotid artery: Secondary | ICD-10-CM

## 2020-07-15 MED ORDER — ROSUVASTATIN CALCIUM 10 MG PO TABS
10.0000 mg | ORAL_TABLET | Freq: Every day | ORAL | 10 refills | Status: DC
Start: 2020-07-15 — End: 2023-05-31

## 2020-07-15 MED ORDER — CILOSTAZOL 100 MG PO TABS: 100.0000 mg | ORAL_TABLET | Freq: Two times a day (BID) | ORAL | 11 refills | Status: DC

## 2020-07-15 MED ORDER — ASPIRIN EC 81 MG PO TBEC: 81.0000 mg | DELAYED_RELEASE_TABLET | Freq: Every day | ORAL | 2 refills | Status: DC

## 2020-07-15 NOTE — Progress Notes (Signed)
[facility] [place]  Patient name: Jasmin Stephenson MRN: [id number] DOB: April 07, 1971 Sex: female   REQUESTING PROVIDER:    Dr. March Rummage   REASON FOR CONSULT:    Right leg claudication  HISTORY OF PRESENT ILLNESS:   Jasmin Stephenson is a 49 y.o. female, who is referred for evaluation of lower extremity vascular disease.  The patient states for the past several months that she gets pain and cramping in her right thigh when walking up the 3 flights of stairs she has to walk up to get to her house.  This goes away with rest.  She does not have any open wounds.  She does not have rest pain.  She tells me that she does need a procedure done on her foot in the near future.  She does have a history of vascular disease in her family.  Her father had a heart attack at 63.  The patient is a smoker.  She has a history of asthma.  PAST MEDICAL HISTORY    Past Medical History:  Diagnosis Date  . Allergy   . Anemia   . Anxiety   . Arthritis    and tendonitis  . Asthma   . Breast calcification, right   . Cervical herniated disc   . Depression   . GERD (gastroesophageal reflux disease)   . Myocardial infarction ([place])    " Mild"  . Vitamin D deficiency      FAMILY HISTORY   Family History  Problem Relation Age of Onset  . Diabetes Father   . Breast cancer Sister 47  . Alcohol abuse Neg Hx   . Arthritis Neg Hx   . Asthma Neg Hx   . Birth defects Neg Hx   . Cancer Neg Hx   . COPD Neg Hx   . Depression Neg Hx   . Drug abuse Neg Hx   . Early death Neg Hx   . Hearing loss Neg Hx   . Heart disease Neg Hx   . Hyperlipidemia Neg Hx   . Hypertension Neg Hx   . Kidney disease Neg Hx   . Learning disabilities Neg Hx   . Mental illness Neg Hx   . Mental retardation Neg Hx   . Miscarriages / Stillbirths Neg Hx   . Stroke Neg Hx   . Vision loss Neg Hx     SOCIAL HISTORY:   Social History   Socioeconomic History  . Marital  status: Single    Spouse name: Not on file  . Number of children: Not on file  . Years of education: Not on file  . Highest education level: Not on file  Occupational History  . Not on file  Tobacco Use  . Smoking status: Current Every Day Smoker    Packs/day: 1.00    Years: 7.00    Pack years: 7.00    Types: Cigarettes  . Smokeless tobacco: Never Used  Vaping Use  . Vaping Use: Never used  Substance and Sexual Activity  . Alcohol use: No  . Drug use: Yes    Types: Marijuana    Comment: 1 evere other week  . Sexual activity: Yes    Birth control/protection: Surgical    Comment: last intercourse 27th Sep 2016  Other Topics Concern  . Not on file  Social History Narrative  . Not on file   Social Determinants of Health   Financial Resource Strain:   . Difficulty of Paying Living  Expenses:   Food Insecurity:   . Worried About Charity fundraiser in the Last Year:   . Arboriculturist in the Last Year:   Transportation Needs:   . Film/video editor (Medical):   Marland Kitchen Lack of Transportation (Non-Medical):   Physical Activity:   . Days of Exercise per Week:   . Minutes of Exercise per Session:   Stress:   . Feeling of Stress :   Social Connections:   . Frequency of Communication with Friends and Family:   . Frequency of Social Gatherings with Friends and Family:   . Attends Religious Services:   . Active Member of Clubs or Organizations:   . Attends Archivist Meetings:   Marland Kitchen Marital Status:   Intimate Partner Violence:   . Fear of Current or Ex-Partner:   . Emotionally Abused:   Marland Kitchen Physically Abused:   . Sexually Abused:     ALLERGIES:    Allergies  Allergen Reactions  . Penicillins Nausea And Vomiting and Rash    Has patient had a PCN reaction causing immediate rash, facial/tongue/throat swelling, SOB or lightheadedness with hypotension: Yes Has patient had a PCN reaction causing severe rash involving mucus membranes or skin necrosis: Yes Has patient  had a PCN reaction that required hospitalization No Has patient had a PCN reaction occurring within the last 10 years: No If all of the above answers are "NO", then may proceed with Cephalosporin use.    CURRENT MEDICATIONS:    Current Outpatient Medications  Medication Sig Dispense Refill  . albuterol (PROVENTIL HFA;VENTOLIN HFA) 108 (90 Base) MCG/ACT inhaler Inhale 2 puffs into the lungs every 4 (four) hours as needed for wheezing or shortness of breath (cough, shortness of breath or wheezing.). 1 Inhaler 11  . albuterol (PROVENTIL) (2.5 MG/3ML) 0.083% nebulizer solution Take 3 mLs (2.5 mg total) by nebulization every 6 (six) hours as needed for wheezing or shortness of breath. 75 mL 12  . ibuprofen (ADVIL) 800 MG tablet Take 800 mg by mouth every 6 (six) hours as needed.    Marland Kitchen aspirin EC 81 MG tablet Take 1 tablet (81 mg total) by mouth daily. 150 tablet 2  . cilostazol (PLETAL) 100 MG tablet Take 1 tablet (100 mg total) by mouth 2 (two) times daily before a meal. 60 tablet 11  . clobetasol ointment (TEMOVATE) 0.05 % Apply to the affected areas of the scalp 3-4 times weekly (Patient not taking: Reported on 07/15/2020)    . Fluocinolone Acetonide Body 0.01 % OIL Apply to scalp 2-3 times per week for itch. (Patient not taking: Reported on 07/15/2020)    . fluticasone (FLONASE) 50 MCG/ACT nasal spray Place 2 sprays into both nostrils daily. (Patient not taking: Reported on 07/15/2020) 16 g 6  . rosuvastatin (CRESTOR) 10 MG tablet Take 1 tablet (10 mg total) by mouth daily. 30 tablet 10   No current facility-administered medications for this visit.    REVIEW OF SYSTEMS:   [X]  denotes positive finding, [ ]  denotes negative finding Cardiac  Comments:  Chest pain or chest pressure:    Shortness of breath upon exertion:    Short of breath when lying flat:    Irregular heart rhythm:        Vascular    Pain in calf, thigh, or hip brought on by ambulation: x   Pain in feet at night that wakes you  up from your sleep:     Blood clot in your veins:  Leg swelling:         Pulmonary    Oxygen at home:    Productive cough:     Wheezing:         Neurologic    Sudden weakness in arms or legs:     Sudden numbness in arms or legs:     Sudden onset of difficulty speaking or slurred speech:    Temporary loss of vision in one eye:     Problems with dizziness:         Gastrointestinal    Blood in stool:      Vomited blood:         Genitourinary    Burning when urinating:     Blood in urine:        Psychiatric    Major depression:         Hematologic    Bleeding problems:    Problems with blood clotting too easily:        Skin    Rashes or ulcers:        Constitutional    Fever or chills:     PHYSICAL EXAM:   Vitals:   07/15/20 1055  BP: (!) 137/92  Pulse: 69  Resp: 20  Temp: 97.8 F (36.6 C)  SpO2: 94%  Weight: 200 lb (90.7 kg)  Height: 5\' 5"  (1.651 m)    GENERAL: The patient is a well-nourished female, in no acute distress. The vital signs are documented above. CARDIAC: There is a regular rate and rhythm.  VASCULAR: Palpable left dorsalis pedis pulse, nonpalpable right PULMONARY: Nonlabored respirations ABDOMEN: Soft and non-tender with normal pitched bowel sounds.  MUSCULOSKELETAL: There are no major deformities or cyanosis. NEUROLOGIC: No focal weakness or paresthesias are detected. SKIN: There are no ulcers or rashes noted. PSYCHIATRIC: The patient has a normal affect.  STUDIES:   I have reviewed the following: +-------+-----------+-----------+------------+------------+  ABI/TBIToday's ABIToday's TBIPrevious ABIPrevious TBI  +-------+-----------+-----------+------------+------------+  Right 0.76    0.69                  +-------+-----------+-----------+------------+------------+  Left  1.11    1.24                  +-------+-----------+-----------+------------+------------+  Right Toe:   98 Left toe:  177  Duplex: Right: Patent right lower extremity arterial system. 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  ASSESSMENT and PLAN   Right leg claudication: I suspect the patient has a right iliac lesion.  Before proceeding with intervention, she needs to be optimized medically.  I started her on Crestor 10 mg today.  I also got her to start taking an 81 mg aspirin.  Cilostazol 100 mg twice daily was prescribed as well.  I encouraged her to begin the process of smoking cessation.  We also discussed an exercise program.  She will follow-up with me in 3 months.  Based on her symptoms at that time, I will make a decision as to whether or not she needs to proceed with angiography and iliac intervention.  Based off her ABIs and toe pressures today, should she require surgery on her right foot, she should be able to proceed as her right toe pressure is 98.   Leia Alf, MD, FACS [facility] [phone] Pager [phone]

## 2020-07-18 ENCOUNTER — Encounter (HOSPITAL_COMMUNITY): Payer: Medicaid Other

## 2020-07-18 ENCOUNTER — Encounter: Payer: Medicaid Other | Admitting: Vascular Surgery

## 2020-07-18 DIAGNOSIS — F4312 Post-traumatic stress disorder, chronic: Secondary | ICD-10-CM | POA: Diagnosis not present

## 2020-08-06 IMAGING — RF DG UGI W/ HIGH DENSITY W/O KUB
9 of 10 series · 14 of 24 positions shown · non-contrast
Comparison: 11/14/2018 upper GI.

CLINICAL DATA: History Nissen fundoplication with chronic right
upper quadrant abdominal pain and bloating and gastroesophageal
reflux disease.

EXAM:
ESOPHOGRAM / BARIUM SWALLOW / BARIUM TABLET STUDY
TECHNIQUE: Combined double contrast and single contrast examination performed
using effervescent crystals, thick barium liquid, and thin barium
liquid. The patient was observed with fluoroscopy swallowing a 13 mm
barium sulphate tablet.
FLUOROSCOPY TIME:  Fluoroscopy Time:  3 minutes 0 second
Radiation Exposure Index (if provided by the fluoroscopic device):
349 mGy
Number of Acquired Spot Images: 14

[Series 1: one shot · 0.14mm/px · 1 of 1 slices shown (1 of 4)]
[im 1/1]
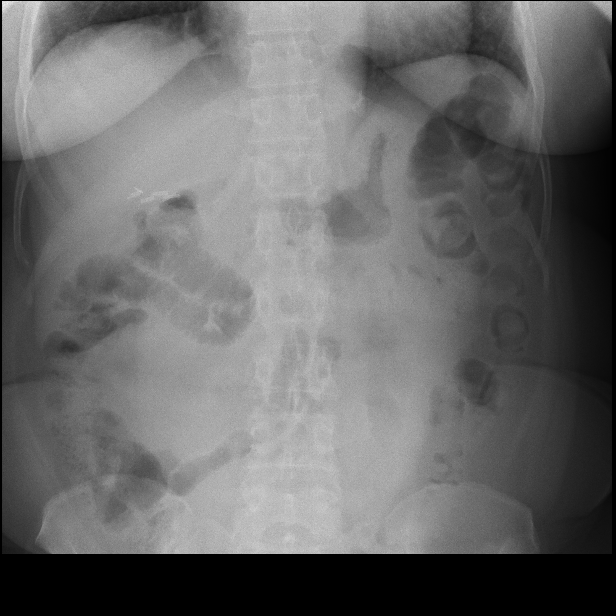

[Series 2: sequence · 1 of 19 frames shown (1 of 5)]
[frame 11/19]
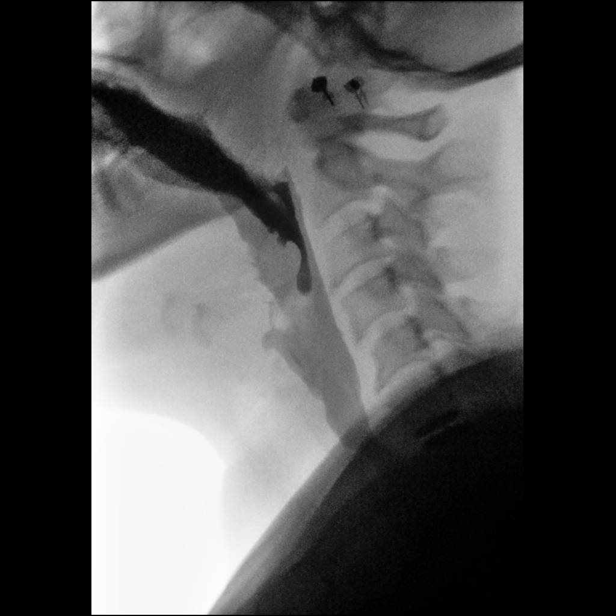

[Series 4: sequence · 2 of 15 frames shown (2 of 5)]
[frame 1/15]
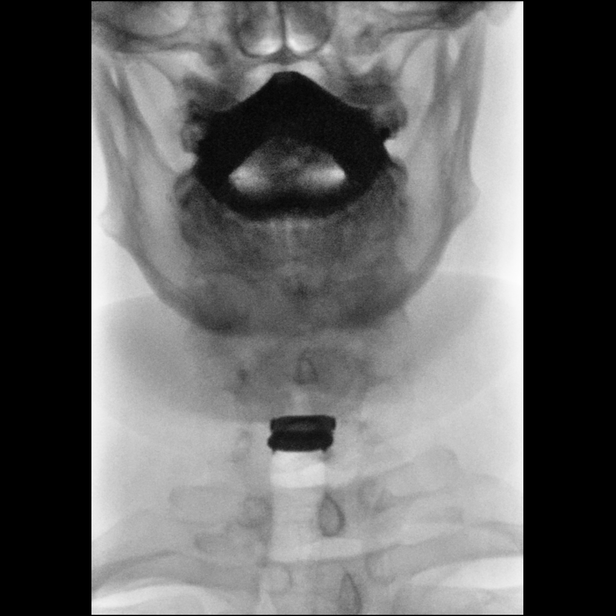
[frame 13/15]
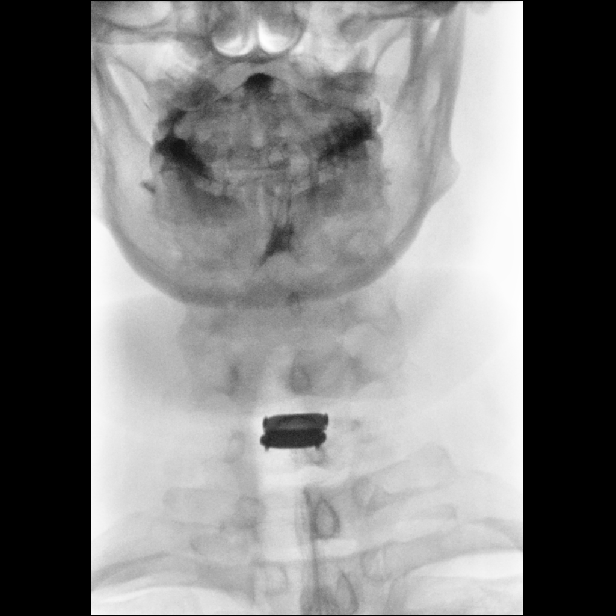

[Series 5: one shot · 0.16mm/px · 4 of 11 slices shown (2 of 4)]
[im 2/11]
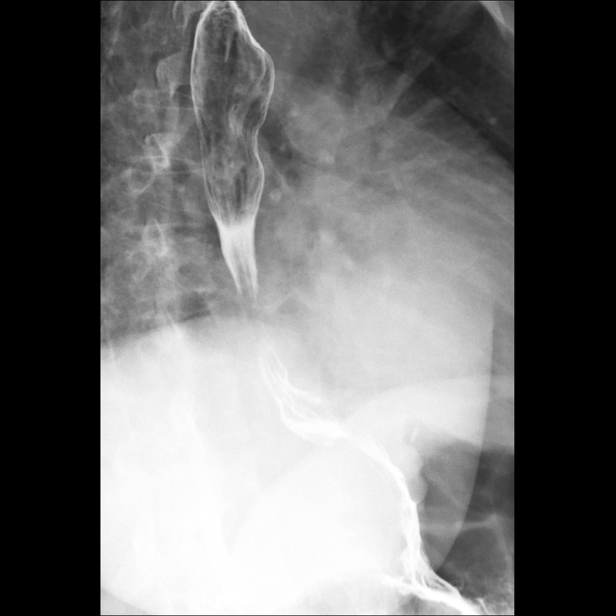
[im 5/11]
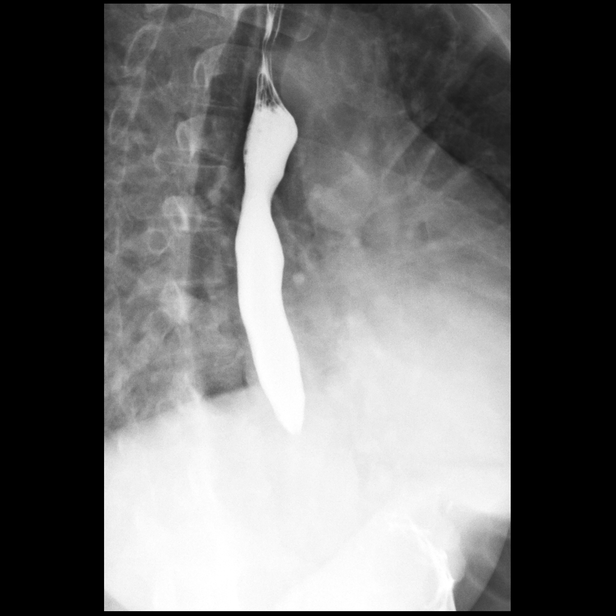
[im 8/11]
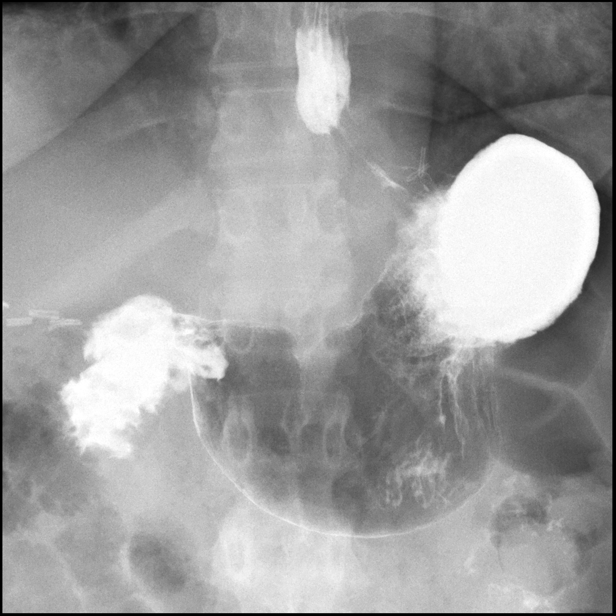
[im 10/11]
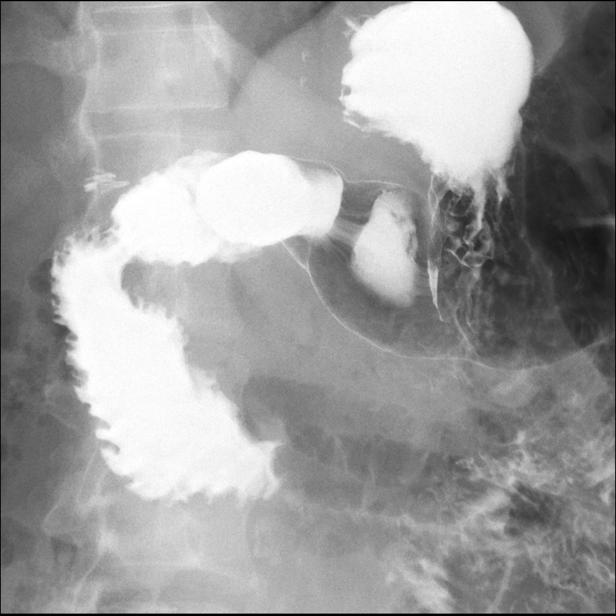

[Series 6: sequence · 1 of 34 frames shown (3 of 5)]
[frame 18/34]
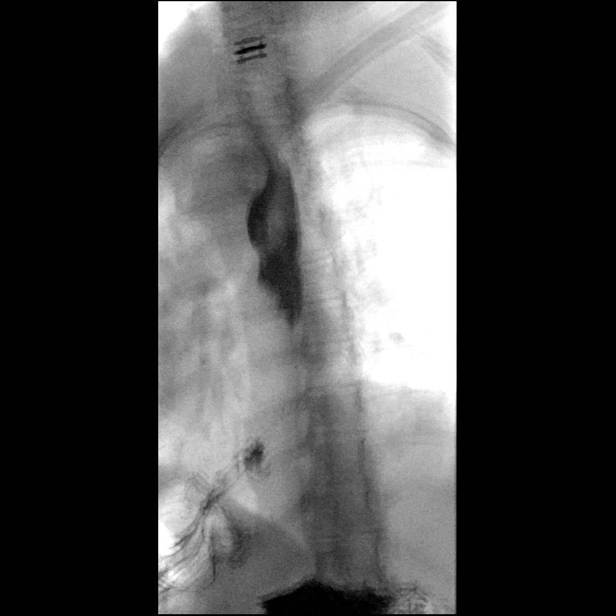

[Series 7: sequence · 2 of 104 frames shown (4 of 5)]
[frame 16/104]
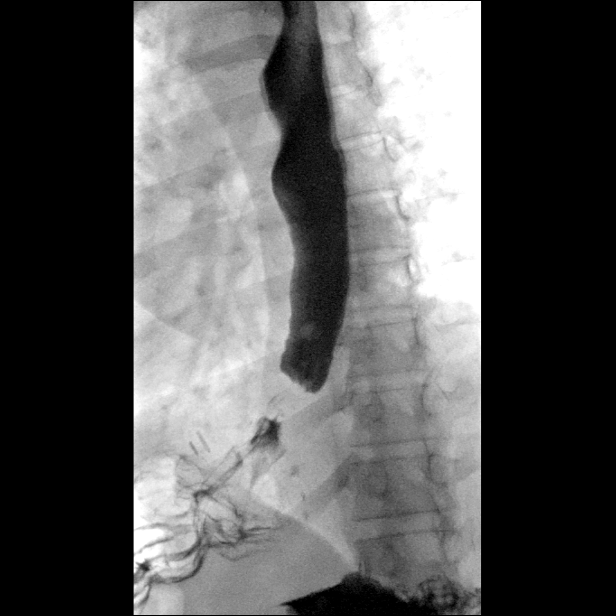
[frame 89/104]
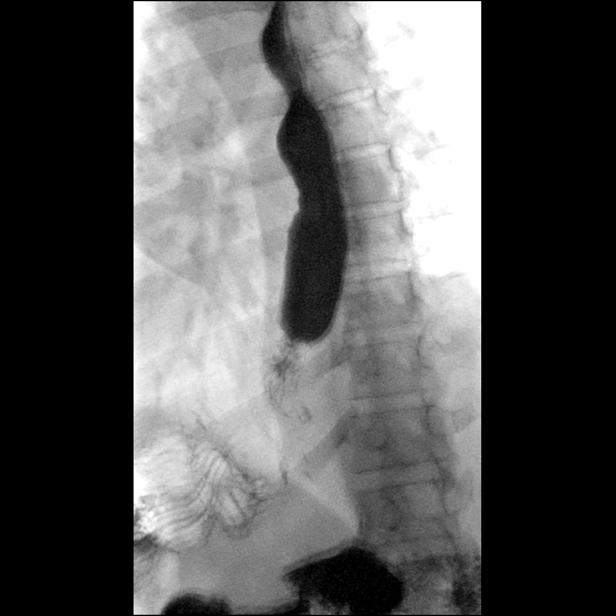

[Series 8: one shot · 0.15mm/px · 1 of 3 slices shown (3 of 4)]
[im 2/3]
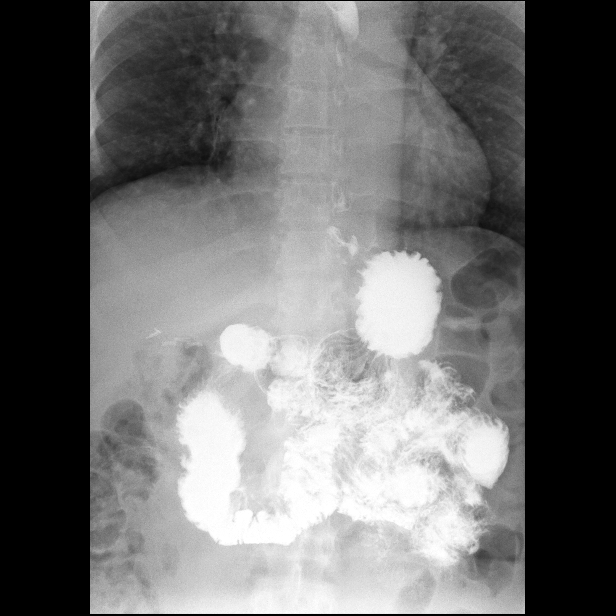

[Series 9: sequence · 1 of 63 frames shown (5 of 5)]
[frame 20/63]
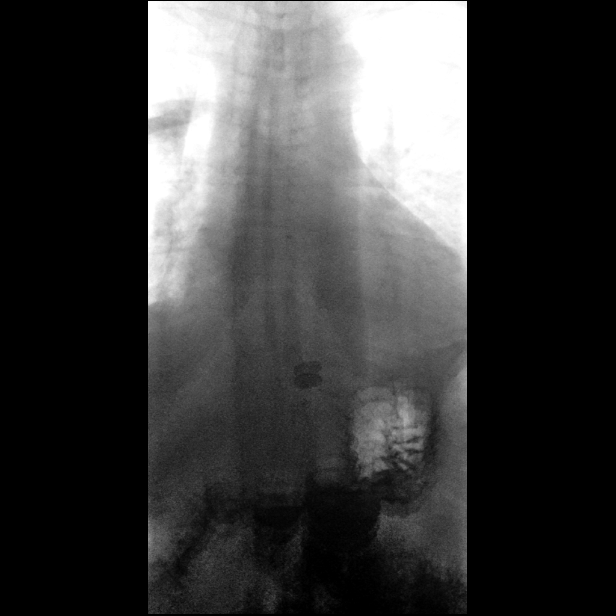

[Series 10: one shot · 1 of 1 slices shown (4 of 4)]
[im 1/1]
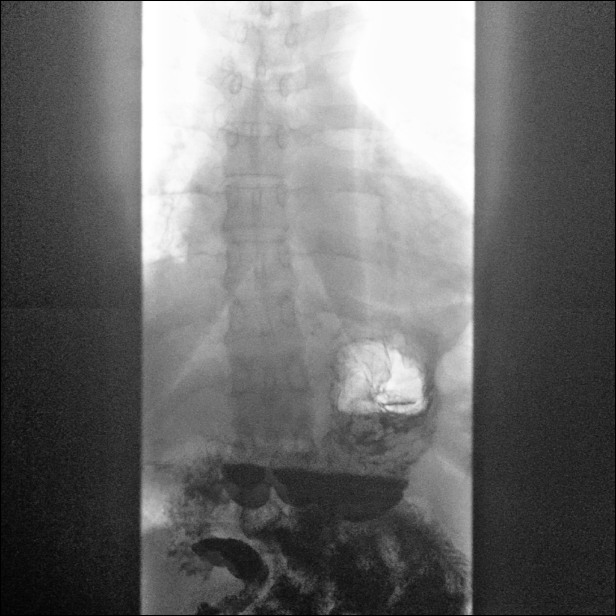

[14 of 24 positions shown; findings below may reference images not displayed]

FINDINGS: Scout radiograph demonstrates no disproportionately dilated small
bowel loops. Mild colonic stool. No evidence of pneumatosis or
pneumoperitoneum. Surgical clips noted in the medial left upper
quadrant from prior fundoplication and in the right upper quadrant
from prior cholecystectomy. No radiopaque nephrolithiasis. Clear
lung bases.

Normal oral and pharyngeal phases of swallowing, with no laryngeal
penetration or tracheobronchial aspiration. No significant barium
retention in the pharynx. No evidence of pharyngeal mass, stricture
or diverticulum. No evidence of cricopharyngeus muscle dysfunction.

Small sliding hiatal hernia. Smooth filling defect in the gastric
cardia from intact appearing fundoplication. No gastroesophageal
reflux elicited despite provocative maneuvers including water siphon
test and Valsalva maneuver. Normal esophageal motility. Mild
granularity of the thoracic esophageal mucosa suggesting mild reflux
esophagitis. Normal esophageal distensibility, with no evidence of
esophageal mass, stricture or ulcer. Barium tablet traversed the
esophagus into the stomach without significant delay. No gastric
fold thickening, filling defects, ulcers or significant erosions.
Normal gastric emptying. Normal duodenal bulb and C sweep, with no
duodenal fold thickening, filling defects, strictures or ulcers.
Normal duodenal jejunal junction to the left of the spine.
Visualized proximal jejunal loops are normal caliber and without
fold thickening.
IMPRESSION: 1. Intact appearing fundoplication.
2. Small sliding hiatal hernia. No gastroesophageal reflux elicited.
3. Evidence of mild reflux esophagitis. No evidence of esophageal
mass or stricture.
4. Normal esophageal motility.

## 2020-08-16 ENCOUNTER — Ambulatory Visit: Payer: Medicaid Other | Admitting: Podiatry

## 2020-08-27 DIAGNOSIS — G8929 Other chronic pain: Secondary | ICD-10-CM | POA: Diagnosis not present

## 2020-08-27 DIAGNOSIS — R1033 Periumbilical pain: Secondary | ICD-10-CM | POA: Diagnosis not present

## 2020-10-14 ENCOUNTER — Ambulatory Visit (INDEPENDENT_AMBULATORY_CARE_PROVIDER_SITE_OTHER): Payer: Medicaid Other | Admitting: Surgery

## 2020-10-14 ENCOUNTER — Other Ambulatory Visit: Payer: Self-pay

## 2020-10-14 ENCOUNTER — Encounter: Payer: Self-pay | Admitting: Surgery

## 2020-10-14 VITALS — BP 117/80 | HR 80 | Temp 98.4°F | Resp 20 | Ht 65.0 in | Wt 201.0 lb

## 2020-10-14 DIAGNOSIS — I70211 Atherosclerosis of native arteries of extremities with intermittent claudication, right leg: Secondary | ICD-10-CM

## 2020-10-14 NOTE — Progress Notes (Signed)
[facility]  Patient name: Jasmin Stephenson MRN: [id number] DOB: [birth year] Sex: female   REASON FOR VISIT:    Follow up  HISOTRY OF PRESENT ILLNESS:   Jasmin Stephenson is a 49 y.o. female, who is referred for evaluation of lower extremity vascular disease.  The patient states for the past several months that she gets pain and cramping in her right thigh when walking up the 3 flights of stairs she has to walk up to get to her house.  This goes away with rest.  She does not have any open wounds.  She does not have rest pain.  She tells me that she does need a procedure done on her foot in the near future.  She does have a history of vascular disease in her family.  Her father had a heart attack at 29.  At her last visit, I started her on 81 mg of aspirin, 10 mg of Crestor.  I encouraged her to start an exercise program and stop smoking.  She also started taking cilostazol.  She feels that she has had some improvement in her walking ability.  The patient is a smoker.  She has a history of asthma.   PAST MEDICAL HISTORY:   Past Medical History:  Diagnosis Date  . Allergy   . Anemia   . Anxiety   . Arthritis    and tendonitis  . Asthma   . Breast calcification, right   . Cervical herniated disc   . Depression   . GERD (gastroesophageal reflux disease)   . Myocardial infarction ([place])    " Mild"  . Vitamin D deficiency      FAMILY HISTORY:   Family History  Problem Relation Age of Onset  . Diabetes Father   . Breast cancer Sister 45  . Alcohol abuse Neg Hx   . Arthritis Neg Hx   . Asthma Neg Hx   . Birth defects Neg Hx   . Cancer Neg Hx   . COPD Neg Hx   . Depression Neg Hx   . Drug abuse Neg Hx   . Early death Neg Hx   . Hearing loss Neg Hx   . Heart disease Neg Hx   . Hyperlipidemia Neg Hx   . Hypertension Neg Hx   . Kidney disease Neg Hx   . Learning disabilities Neg Hx   . Mental illness Neg Hx    . Mental retardation Neg Hx   . Miscarriages / Stillbirths Neg Hx   . Stroke Neg Hx   . Vision loss Neg Hx     SOCIAL HISTORY:   Social History   Tobacco Use  . Smoking status: Current Every Day Smoker    Packs/day: 1.00    Years: 7.00    Pack years: 7.00    Types: Cigarettes  . Smokeless tobacco: Never Used  Substance Use Topics  . Alcohol use: No     ALLERGIES:   Allergies  Allergen Reactions  . Penicillins Nausea And Vomiting and Rash    Has patient had a PCN reaction causing immediate rash, facial/tongue/throat swelling, SOB or lightheadedness with hypotension: Yes Has patient had a PCN reaction causing severe rash involving mucus membranes or skin necrosis: Yes Has patient had a PCN reaction that required hospitalization No Has patient had a PCN reaction occurring within the last 10 years: No If all of the above answers are "NO", then may proceed with Cephalosporin use.  CURRENT MEDICATIONS:   Current Outpatient Medications  Medication Sig Dispense Refill  . albuterol (PROVENTIL HFA;VENTOLIN HFA) 108 (90 Base) MCG/ACT inhaler Inhale 2 puffs into the lungs every 4 (four) hours as needed for wheezing or shortness of breath (cough, shortness of breath or wheezing.). 1 Inhaler 11  . albuterol (PROVENTIL) (2.5 MG/3ML) 0.083% nebulizer solution Take 3 mLs (2.5 mg total) by nebulization every 6 (six) hours as needed for wheezing or shortness of breath. 75 mL 12  . aspirin EC 81 MG tablet Take 1 tablet (81 mg total) by mouth daily. 150 tablet 2  . cilostazol (PLETAL) 100 MG tablet Take 1 tablet (100 mg total) by mouth 2 (two) times daily before a meal. 60 tablet 11  . ibuprofen (ADVIL) 800 MG tablet Take 800 mg by mouth every 6 (six) hours as needed.    . rosuvastatin (CRESTOR) 10 MG tablet Take 1 tablet (10 mg total) by mouth daily. 30 tablet 10  . clobetasol ointment (TEMOVATE) 0.05 % Apply to the affected areas of the scalp 3-4 times weekly (Patient not taking:  Reported on 07/15/2020)    . Fluocinolone Acetonide Body 0.01 % OIL Apply to scalp 2-3 times per week for itch. (Patient not taking: Reported on 07/15/2020)    . fluticasone (FLONASE) 50 MCG/ACT nasal spray Place 2 sprays into both nostrils daily. (Patient not taking: Reported on 07/15/2020) 16 g 6   No current facility-administered medications for this visit.    REVIEW OF SYSTEMS:   [X]  denotes positive finding, [ ]  denotes negative finding Cardiac  Comments:  Chest pain or chest pressure:    Shortness of breath upon exertion:    Short of breath when lying flat:    Irregular heart rhythm:        Vascular    Pain in calf, thigh, or hip brought on by ambulation: x   Pain in feet at night that wakes you up from your sleep:     Blood clot in your veins:    Leg swelling:         Pulmonary    Oxygen at home:    Productive cough:     Wheezing:         Neurologic    Sudden weakness in arms or legs:     Sudden numbness in arms or legs:     Sudden onset of difficulty speaking or slurred speech:    Temporary loss of vision in one eye:     Problems with dizziness:         Gastrointestinal    Blood in stool:     Vomited blood:         Genitourinary    Burning when urinating:     Blood in urine:        Psychiatric    Major depression:         Hematologic    Bleeding problems:    Problems with blood clotting too easily:        Skin    Rashes or ulcers:        Constitutional    Fever or chills:      PHYSICAL EXAM:   Vitals:   10/14/20 0932  BP: 117/80  Pulse: 80  Resp: 20  Temp: 98.4 F (36.9 C)  SpO2: 95%  Weight: 201 lb (91.2 kg)  Height: 5\' 5"  (1.651 m)    GENERAL: The patient is a well-nourished female, in no acute distress. The vital signs are  documented above. CARDIAC: There is a regular rate and rhythm.  VASCULAR: Nonpalpable pulse on the right PULMONARY: Non-labored respirations MUSCULOSKELETAL: There are no major deformities or cyanosis. NEUROLOGIC: No  focal weakness or paresthesias are detected. SKIN: There are no ulcers or rashes noted. PSYCHIATRIC: The patient has a normal affect.  STUDIES:   None  MEDICAL ISSUES:   Right leg claudication: The patient is being optimally managed medically.  She does not have any open wounds.  I have encouraged her to continue trying to stop smoking and to improve her walking distance.  I do not feel that she requires intervention at this time.  She knows to contact me if she develops a nonhealing wound.  Otherwise I will see her back in 1 year with repeat ABI duplex of the right leg.    Leia Alf, MD, FACS [facility] [phone] Pager [phone]

## 2020-10-22 ENCOUNTER — Other Ambulatory Visit: Payer: Self-pay

## 2020-10-22 ENCOUNTER — Ambulatory Visit (INDEPENDENT_AMBULATORY_CARE_PROVIDER_SITE_OTHER): Payer: Medicaid Other

## 2020-10-22 ENCOUNTER — Ambulatory Visit (INDEPENDENT_AMBULATORY_CARE_PROVIDER_SITE_OTHER): Payer: Medicaid Other | Admitting: Podiatry

## 2020-10-22 ENCOUNTER — Other Ambulatory Visit: Payer: Self-pay | Admitting: Podiatry

## 2020-10-22 DIAGNOSIS — S90121A Contusion of right lesser toe(s) without damage to nail, initial encounter: Secondary | ICD-10-CM

## 2020-10-22 DIAGNOSIS — Q828 Other specified congenital malformations of skin: Secondary | ICD-10-CM

## 2020-10-22 DIAGNOSIS — I739 Peripheral vascular disease, unspecified: Secondary | ICD-10-CM | POA: Diagnosis not present

## 2020-10-22 DIAGNOSIS — M79671 Pain in right foot: Secondary | ICD-10-CM

## 2020-10-22 NOTE — Progress Notes (Signed)
  Subjective:  Patient ID: DONYA HITCH, female    DOB: 05-Oct-1971,  MRN: [id number]  Chief Complaint  Patient presents with  . Toe Injury    Right 5th toe injury 1 1/2 months duration "My grandson stomped on it"   49 y.o. female presents with the above complaint. History confirmed with patient.  Objective:  Physical Exam: warm, good capillary refill, no trophic changes or ulcerative lesions, normal DP and PT pulses and normal sensory exam. Right 5th toe POP without obvious laceration of the syndactylized space. Multiple punctate painful lesions bilateral foot  Assessment:   1. Contusion of fifth toe of right foot, initial encounter   2. Intermittent claudication ([place])   3. Porokeratosis      Plan:  Patient was evaluated and treated and all questions answered.  Contusion right 5th toe -No obvious laceration but it will take time to heal, buddy taped today, should pain persist will have to consider repeat syndactylization.  Claudication Right Thigh -Improving per patient  No follow-ups on file.

## 2020-11-13 DIAGNOSIS — N920 Excessive and frequent menstruation with regular cycle: Secondary | ICD-10-CM

## 2020-11-13 DIAGNOSIS — N946 Dysmenorrhea, unspecified: Secondary | ICD-10-CM

## 2020-11-13 HISTORY — DX: Dysmenorrhea, unspecified: N94.6

## 2020-11-13 HISTORY — DX: Excessive and frequent menstruation with regular cycle: N92.0

## 2020-11-19 ENCOUNTER — Encounter: Payer: Self-pay | Admitting: Podiatry

## 2020-11-19 ENCOUNTER — Other Ambulatory Visit: Payer: Self-pay

## 2020-11-19 ENCOUNTER — Ambulatory Visit (INDEPENDENT_AMBULATORY_CARE_PROVIDER_SITE_OTHER): Payer: Medicaid Other | Admitting: Podiatry

## 2020-11-19 DIAGNOSIS — Q7031 Webbed toes, right foot: Secondary | ICD-10-CM | POA: Diagnosis not present

## 2020-11-19 DIAGNOSIS — Z720 Tobacco use: Secondary | ICD-10-CM | POA: Diagnosis not present

## 2020-11-19 DIAGNOSIS — M2041 Other hammer toe(s) (acquired), right foot: Secondary | ICD-10-CM

## 2020-11-19 DIAGNOSIS — M79674 Pain in right toe(s): Secondary | ICD-10-CM

## 2020-11-19 NOTE — Progress Notes (Signed)
  Subjective:  Patient ID: Jasmin Stephenson, female    DOB: [birth year],  MRN: [id number]  Chief Complaint  Patient presents with  . Toe Injury    Pt states has reduction in swelling but still experiencing some pain. Pt also states she has burning/tingling sensation in the area and wants to know if it is neuropathy.   49 y.o. female presents with the above complaint. History confirmed with patient.  Objective:  Physical Exam: warm, good capillary refill, no trophic changes or ulcerative lesions, normal DP and PT pulses and normal sensory exam. Right 5th toe POP with possible small laceration of the syndactylized space. Multiple punctate painful lesions bilateral foot  Assessment:   1. Hammer toe of right foot   2. Pain in toe of right foot   3. Tobacco use   4. Webbing of toes, right    Plan:  Patient was evaluated and treated and all questions answered.  Contusion right 5th toe -It appears that patient has torn part of the syndactylization. Will repeat and try to further web her toes together. -Patient has failed all conservative therapy and wishes to proceed with surgical intervention. All risks, benefits, and alternatives discussed with patient. No guarantees given. Consent reviewed and signed by patient. -Planned procedures: right foot repair of syndactylization -Identified risk factors: Tobacco use -DME dispensed for post-op use: none   Claudication Right Thigh -Improving per patient  No follow-ups on file.

## 2020-12-04 ENCOUNTER — Encounter: Payer: Self-pay | Admitting: Family Medicine

## 2020-12-04 ENCOUNTER — Ambulatory Visit (INDEPENDENT_AMBULATORY_CARE_PROVIDER_SITE_OTHER): Payer: Medicaid Other | Admitting: Family Medicine

## 2020-12-04 ENCOUNTER — Other Ambulatory Visit: Payer: Self-pay

## 2020-12-04 VITALS — BP 125/86 | HR 84 | Temp 97.9°F | Resp 18 | Ht 65.0 in | Wt 197.6 lb

## 2020-12-04 DIAGNOSIS — J302 Other seasonal allergic rhinitis: Secondary | ICD-10-CM | POA: Diagnosis not present

## 2020-12-04 DIAGNOSIS — Z09 Encounter for follow-up examination after completed treatment for conditions other than malignant neoplasm: Secondary | ICD-10-CM | POA: Diagnosis not present

## 2020-12-04 DIAGNOSIS — L299 Pruritus, unspecified: Secondary | ICD-10-CM

## 2020-12-04 DIAGNOSIS — J45909 Unspecified asthma, uncomplicated: Secondary | ICD-10-CM | POA: Diagnosis not present

## 2020-12-04 DIAGNOSIS — L659 Nonscarring hair loss, unspecified: Secondary | ICD-10-CM | POA: Diagnosis not present

## 2020-12-04 DIAGNOSIS — Z23 Encounter for immunization: Secondary | ICD-10-CM

## 2020-12-04 DIAGNOSIS — R1084 Generalized abdominal pain: Secondary | ICD-10-CM | POA: Diagnosis not present

## 2020-12-04 MED ORDER — ALBUTEROL SULFATE (2.5 MG/3ML) 0.083% IN NEBU: 2.5000 mg | INHALATION_SOLUTION | Freq: Four times a day (QID) | RESPIRATORY_TRACT | 12 refills | Status: DC | PRN

## 2020-12-04 MED ORDER — ALBUTEROL SULFATE HFA 108 (90 BASE) MCG/ACT IN AERS: 2.0000 | INHALATION_SPRAY | RESPIRATORY_TRACT | 11 refills | Status: DC | PRN

## 2020-12-04 MED ORDER — IBUPROFEN 800 MG PO TABS: 800.0000 mg | ORAL_TABLET | Freq: Four times a day (QID) | ORAL | 6 refills | Status: DC | PRN

## 2020-12-04 NOTE — Progress Notes (Signed)
Patient [facility]   Established Patient Office Visit  Subjective:  Patient ID: Jasmin Stephenson, female    DOB: 07/18/1971  Age: 49 y.o. MRN: [id number]  CC:  Chief Complaint  Patient presents with  . Follow-up    HPI Jasmin Stephenson is a 49 year old female who presents for Follow Up today.     Patient Active Problem List   Diagnosis Date Noted  . Tinea corporis 01/25/2019  . Cervical radiculopathy 09/21/2018  . History of fusion of cervical spine 09/21/2018  . Numbness and tingling 09/21/2018  . Neck pain 09/02/2018  . 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 Hair loss 01/16/2016  . Pain in joint, ankle and foot 01/16/2016    Current Status: Since her last office visit, she is doing well with no complaints.  She has c/o skin problems and alopecia. She is currently following up with Dermatologist, Dr. Ike Bene in [facility]. She continues to have chronic abdominal pain, which she follows up with GI. She is scheduled for foot surgery in 12/2020.  She denies fevers, chills, fatigue, recent infections, weight loss, and night sweats. She has not had any headaches, visual changes, dizziness, and falls. No chest pain, heart palpitations, cough and shortness of breath reported. Denies GI problems such as nausea, vomiting, diarrhea, and constipation. She has no reports of blood in stools, dysuria and hematuria. No depression or anxiety reported today. She is taking all medications as prescribed.   Past Medical History:  Diagnosis Date  . Allergy   . Anemia   . Anxiety   . Arthritis    and tendonitis  . Asthma   . Breast calcification, right   . Cervical herniated disc   . Depression   . GERD (gastroesophageal reflux disease)   . Myocardial infarction ([place])    " Mild"  . Vitamin D deficiency     Past Surgical History:  Procedure Laterality Date  . ABDOMINAL SURGERY    . APPENDECTOMY    . BLADDER  SURGERY    . CERVICAL DISC ARTHROPLASTY N/A 09/21/2016   Procedure: CERVICAL SIX- CERVICAL SEVEN ARTHROPLASTY;  Surgeon: Kevan Ny Ditty, MD;  Location: [facility];  Service: Neurosurgery;  Laterality: N/A;  C6-7 Arthroplasty  . CHOLECYSTECTOMY N/A 10/10/2019   Procedure: LAPAROSCOPIC CHOLECYSTECTOMY WITH INTRAOPERATIVE CHOLANGIOGRAM;  Surgeon: Donnie Mesa, MD;  Location: [facility];  Service: General;  Laterality: N/A;  . FOOT SURGERY Left    corrections on foot  . IRRIGATION AND DEBRIDEMENT ABSCESS Right 09/10/2014   Procedure: IRRIGATION AND DEBRIDEMENT RIGHT BREAST  ABSCESS;  Surgeon: Gayland Curry, MD;  Location: [facility];  Service: General;  Laterality: Right;  . WISDOM TOOTH EXTRACTION      Family History  Problem Relation Age of Onset  . Diabetes Father   . Breast cancer Sister 46  . Alcohol abuse Neg Hx   . Arthritis Neg Hx   . Asthma Neg Hx   . Birth defects Neg Hx   . Cancer Neg Hx   . COPD Neg Hx   . Depression Neg Hx   . Drug abuse Neg Hx   . Early death Neg Hx   . Hearing loss Neg Hx   . Heart disease Neg Hx   . Hyperlipidemia Neg Hx   . Hypertension Neg Hx   . Kidney disease Neg Hx   . Learning disabilities Neg Hx   . Mental illness Neg  Hx   . Mental retardation Neg Hx   . Miscarriages / Stillbirths Neg Hx   . Stroke Neg Hx   . Vision loss Neg Hx     Social History   Socioeconomic History  . Marital status: Single    Spouse name: Not on file  . Number of children: Not on file  . Years of education: Not on file  . Highest education level: Not on file  Occupational History  . Not on file  Tobacco Use  . Smoking status: Current Every Day Smoker    Packs/day: 1.00    Years: 7.00    Pack years: 7.00    Types: Cigarettes  . Smokeless tobacco: Never Used  Vaping Use  . Vaping Use: Never used  Substance and Sexual Activity  . Alcohol use: No  . Drug use: Yes    Types: Marijuana    Comment: 1 evere other week  . Sexual activity: Yes    Birth  control/protection: Surgical    Comment: last intercourse 27th Sep 2016  Other Topics Concern  . Not on file  Social History Narrative  . Not on file   Social Determinants of Health   Financial Resource Strain: Not on file  Food Insecurity: Not on file  Transportation Needs: Not on file  Physical Activity: Not on file  Stress: Not on file  Social Connections: Not on file  Intimate Partner Violence: Not on file    Outpatient Medications Prior to Visit  Medication Sig Dispense Refill  . aspirin EC 81 MG tablet Take 1 tablet (81 mg total) by mouth daily. 150 tablet 2  . cilostazol (PLETAL) 100 MG tablet Take 1 tablet (100 mg total) by mouth 2 (two) times daily before a meal. 60 tablet 11  . clobetasol ointment (TEMOVATE) 0.05 % Apply to the affected areas of the scalp 3-4 times weekly    . diclofenac Sodium (VOLTAREN) 1 % GEL Apply 1 application topically 4 (four) times daily.    . Fluocinolone Acetonide Body 0.01 % OIL Apply to scalp 2-3 times per week for itch.    . fluticasone (FLONASE) 50 MCG/ACT nasal spray Place 2 sprays into both nostrils daily. 16 g 6  . rosuvastatin (CRESTOR) 10 MG tablet Take 1 tablet (10 mg total) by mouth daily. 30 tablet 10  . albuterol (PROVENTIL HFA;VENTOLIN HFA) 108 (90 Base) MCG/ACT inhaler Inhale 2 puffs into the lungs every 4 (four) hours as needed for wheezing or shortness of breath (cough, shortness of breath or wheezing.). 1 Inhaler 11  . albuterol (PROVENTIL) (2.5 MG/3ML) 0.083% nebulizer solution Take 3 mLs (2.5 mg total) by nebulization every 6 (six) hours as needed for wheezing or shortness of breath. 75 mL 12  . ibuprofen (ADVIL) 800 MG tablet Take 800 mg by mouth every 6 (six) hours as needed.    Marland Kitchen CALCIUM ASCORBATE PO Take 10 mg by mouth daily. (Patient not taking: Reported on 12/04/2020)     No facility-administered medications prior to visit.    Allergies  Allergen Reactions  . Penicillins Nausea And Vomiting and Rash    Has patient  had a PCN reaction causing immediate rash, facial/tongue/throat swelling, SOB or lightheadedness with hypotension: Yes Has patient had a PCN reaction causing severe rash involving mucus membranes or skin necrosis: Yes Has patient had a PCN reaction that required hospitalization No Has patient had a PCN reaction occurring within the last 10 years: No If all of the above answers are "NO", then  may proceed with Cephalosporin use.    ROS Review of Systems  Constitutional: Negative.   HENT: Negative.   Eyes: Negative.   Respiratory: Negative.   Cardiovascular: Negative.   Gastrointestinal: Negative.   Endocrine: Negative.   Genitourinary: Negative.   Musculoskeletal: Positive for arthralgias (generalized joint pain).       Chronic foot pain  Skin: Negative.   Allergic/Immunologic: Negative.   Neurological: Positive for dizziness (occasional ) and headaches (occasional ).  Hematological: Negative.   Psychiatric/Behavioral: Negative.       Objective:    Physical Exam Vitals and nursing note reviewed.  Constitutional:      Appearance: Normal appearance.  HENT:     Head: Normocephalic and atraumatic.     Nose: Nose normal.     Mouth/Throat:     Mouth: Mucous membranes are moist.     Pharynx: Oropharynx is clear.  Cardiovascular:     Rate and Rhythm: Normal rate and regular rhythm.     Pulses: Normal pulses.     Heart sounds: Normal heart sounds.  Pulmonary:     Effort: Pulmonary effort is normal.     Breath sounds: Normal breath sounds.  Abdominal:     General: Bowel sounds are normal.     Palpations: Abdomen is soft.  Musculoskeletal:        General: Normal range of motion.     Cervical back: Normal range of motion and neck supple.  Skin:    General: Skin is warm and dry.  Neurological:     General: No focal deficit present.     Mental Status: She is alert and oriented to person, place, and time.  Psychiatric:        Mood and Affect: Mood normal.        Behavior:  Behavior normal.        Thought Content: Thought content normal.        Judgment: Judgment normal.     BP 125/86 (BP Location: Right Arm, Patient Position: Sitting, Cuff Size: Large)   Pulse 84   Temp 97.9 F (36.6 C) (Temporal)   Resp 18   Ht 5\' 5"  (1.651 m)   Wt 197 lb 9.6 oz (89.6 kg)   SpO2 100%   BMI 32.88 kg/m  Wt Readings from Last 3 Encounters:  12/04/20 197 lb 9.6 oz (89.6 kg)  10/14/20 201 lb (91.2 kg)  07/15/20 200 lb (90.7 kg)     Health Maintenance Due  Topic Date Due  . Hepatitis C Screening  Never done  . COVID-19 Vaccine (1) Never done  . COLONOSCOPY (Pts 45-3yrs Insurance coverage will need to be confirmed)  Never done  . PAP SMEAR-Modifier  11/17/2020    There are no preventive care reminders to display for this patient.  Lab Results  Component Value Date   TSH 2.740 03/06/2019   Lab Results  Component Value Date   WBC 8.1 12/04/2020   HGB 7.2 (L) 12/04/2020   HCT 27.6 (L) 12/04/2020   MCV 65 (L) 12/04/2020   PLT 533 (H) 12/04/2020   Lab Results  Component Value Date   NA 140 10/03/2019   K 3.5 10/03/2019   CO2 24 10/03/2019   GLUCOSE 98 10/03/2019   BUN <5 (L) 10/03/2019   CREATININE 0.74 10/03/2019   BILITOT 0.2 03/06/2019   ALKPHOS 113 03/06/2019   AST 14 03/06/2019   ALT 8 03/06/2019   PROT 6.7 03/06/2019   ALBUMIN 4.3 03/06/2019   CALCIUM  8.8 (L) 10/03/2019   ANIONGAP 7 10/03/2019   Lab Results  Component Value Date   CHOL 133 12/04/2020   Lab Results  Component Value Date   HDL 34 (L) 12/04/2020   Lab Results  Component Value Date   LDLCALC 78 12/04/2020   Lab Results  Component Value Date   TRIG 113 12/04/2020   Lab Results  Component Value Date   CHOLHDL 3.9 12/04/2020   Lab Results  Component Value Date   HGBA1C 5.8 (H) 12/04/2020      Assessment & Plan:   1. Flu vaccine need - Flu Vaccine QUAD 6+ mos PF IM (Fluarix Quad PF)  2. Mild asthma without complication, unspecified whether  persistent Stable today. No signs or symptoms of respiratory distress noted or reported.  - albuterol (PROVENTIL) (2.5 MG/3ML) 0.083% nebulizer solution; Take 3 mLs (2.5 mg total) by nebulization every 6 (six) hours as needed for wheezing or shortness of breath.  Dispense: 75 mL; Refill: 12 - albuterol (VENTOLIN HFA) 108 (90 Base) MCG/ACT inhaler; Inhale 2 puffs into the lungs every 4 (four) hours as needed for wheezing or shortness of breath (cough, shortness of breath or wheezing.).  Dispense: 1 each; Refill: 11 - CBC with Differential - Comprehensive metabolic panel; Future - Lipid Panel - Hemoglobin A1c - Vitamin B12 - Vitamin D, 25-hydroxy  3. Seasonal allergies - albuterol (PROVENTIL) (2.5 MG/3ML) 0.083% nebulizer solution; Take 3 mLs (2.5 mg total) by nebulization every 6 (six) hours as needed for wheezing or shortness of breath.  Dispense: 75 mL; Refill: 12  4. Generalized abdominal pain - ibuprofen (ADVIL) 800 MG tablet; Take 1 tablet (800 mg total) by mouth every 6 (six) hours as needed.  Dispense: 60 tablet; Refill: 6  5. Alopecia She will continue to follow up with Dermatology as needed.   6. Itching  7. Follow up She will follow up in 6 months.   Meds ordered this encounter  Medications  . albuterol (PROVENTIL) (2.5 MG/3ML) 0.083% nebulizer solution    Sig: Take 3 mLs (2.5 mg total) by nebulization every 6 (six) hours as needed for wheezing or shortness of breath.    Dispense:  75 mL    Refill:  12  . albuterol (VENTOLIN HFA) 108 (90 Base) MCG/ACT inhaler    Sig: Inhale 2 puffs into the lungs every 4 (four) hours as needed for wheezing or shortness of breath (cough, shortness of breath or wheezing.).    Dispense:  1 each    Refill:  11  . ibuprofen (ADVIL) 800 MG tablet    Sig: Take 1 tablet (800 mg total) by mouth every 6 (six) hours as needed.    Dispense:  60 tablet    Refill:  6    Orders Placed This Encounter  Procedures  . Flu Vaccine QUAD 6+ mos PF IM  (Fluarix Quad PF)  . CBC with Differential  . Comprehensive metabolic panel  . Lipid Panel  . Hemoglobin A1c  . Vitamin B12  . Vitamin D, 25-hydroxy    Referral Orders  No referral(s) requested today    Kathe Becton, MSN, ANE, FNP-BC [facility] [place], [place] [address] [phone] fax    Problem List Items Addressed This Visit   None   Visit Diagnoses    Flu vaccine need    -  Primary   Relevant Orders   Flu Vaccine QUAD 6+ mos  PF IM (Fluarix Quad PF) (Completed)   Mild asthma without complication, unspecified whether persistent       Relevant Medications   albuterol (PROVENTIL) (2.5 MG/3ML) 0.083% nebulizer solution   albuterol (VENTOLIN HFA) 108 (90 Base) MCG/ACT inhaler   Other Relevant Orders   CBC with Differential (Completed)   Comprehensive metabolic panel   Lipid Panel (Completed)   Hemoglobin A1c (Completed)   Vitamin B12 (Completed)   Vitamin D, 25-hydroxy (Completed)   Seasonal allergies       Relevant Medications   albuterol (PROVENTIL) (2.5 MG/3ML) 0.083% nebulizer solution   Generalized abdominal pain       Relevant Medications   ibuprofen (ADVIL) 800 MG tablet   Alopecia       Itching       Follow up          Meds ordered this encounter  Medications  . albuterol (PROVENTIL) (2.5 MG/3ML) 0.083% nebulizer solution    Sig: Take 3 mLs (2.5 mg total) by nebulization every 6 (six) hours as needed for wheezing or shortness of breath.    Dispense:  75 mL    Refill:  12  . albuterol (VENTOLIN HFA) 108 (90 Base) MCG/ACT inhaler    Sig: Inhale 2 puffs into the lungs every 4 (four) hours as needed for wheezing or shortness of breath (cough, shortness of breath or wheezing.).    Dispense:  1 each    Refill:  11  . ibuprofen (ADVIL) 800 MG tablet    Sig: Take 1 tablet (800 mg total) by mouth every 6 (six) hours as needed.     Dispense:  60 tablet    Refill:  6    Follow-up: Return in about 6 months (around 06/04/2021).    [name], FNP

## 2020-12-04 NOTE — Progress Notes (Signed)
Needs surgical clearance for surgery in Jan at [facility], specialist previously referred to not close to home, needs new referral   -Discuss special soap for skin, dry/flaky skin  -Discuss stomach issues   -flu vaccine

## 2020-12-05 LAB — VITAMIN D 25 HYDROXY (VIT D DEFICIENCY, FRACTURES): Vit D, 25-Hydroxy: 8.7 ng/mL — ABNORMAL LOW (ref 30.0–100.0)

## 2020-12-05 LAB — CBC WITH DIFFERENTIAL/PLATELET
Basophils Absolute: 0.1 10*3/uL (ref 0.0–0.2)
Basos: 1 %
EOS (ABSOLUTE): 0.2 10*3/uL (ref 0.0–0.4)
Eos: 2 %
Hematocrit: 27.6 % — ABNORMAL LOW (ref 34.0–46.6)
Hemoglobin: 7.2 g/dL — ABNORMAL LOW (ref 11.1–15.9)
Immature Grans (Abs): 0 10*3/uL (ref 0.0–0.1)
Immature Granulocytes: 0 %
Lymphocytes Absolute: 2.7 10*3/uL (ref 0.7–3.1)
Lymphs: 34 %
MCH: 17.1 pg — ABNORMAL LOW (ref 26.6–33.0)
MCHC: 26.1 g/dL — ABNORMAL LOW (ref 31.5–35.7)
MCV: 65 fL — ABNORMAL LOW (ref 79–97)
Monocytes Absolute: 0.7 10*3/uL (ref 0.1–0.9)
Monocytes: 9 %
Neutrophils Absolute: 4.4 10*3/uL (ref 1.4–7.0)
Neutrophils: 54 %
Platelets: 533 10*3/uL — ABNORMAL HIGH (ref 150–450)
RBC: 4.22 x10E6/uL (ref 3.77–5.28)
RDW: 18.6 % — ABNORMAL HIGH (ref 11.7–15.4)
WBC: 8.1 10*3/uL (ref 3.4–10.8)

## 2020-12-05 LAB — HEMOGLOBIN A1C
Est. average glucose Bld gHb Est-mCnc: 120 mg/dL
Hgb A1c MFr Bld: 5.8 % — ABNORMAL HIGH (ref 4.8–5.6)

## 2020-12-05 LAB — LIPID PANEL
Chol/HDL Ratio: 3.9 ratio (ref 0.0–4.4)
Cholesterol, Total: 133 mg/dL (ref 100–199)
HDL: 34 mg/dL — ABNORMAL LOW (ref >39)
LDL Chol Calc (NIH): 78 mg/dL (ref 0–99)
Triglycerides: 113 mg/dL (ref 0–149)
VLDL Cholesterol Cal: 21 mg/dL (ref 5–40)

## 2020-12-05 LAB — VITAMIN B12: Vitamin B-12: 410 pg/mL (ref 232–1245)

## 2020-12-10 ENCOUNTER — Encounter: Payer: Self-pay | Admitting: Family Medicine

## 2020-12-11 ENCOUNTER — Telehealth: Payer: Self-pay | Admitting: Family Medicine

## 2020-12-11 NOTE — Telephone Encounter (Signed)
Recent labs reveal Hgb decreased at 7.6. Message left on patient's voicemail for patient to contact office for further assessment and possible treatment.

## 2020-12-12 ENCOUNTER — Telehealth: Payer: Self-pay | Admitting: Family Medicine

## 2020-12-13 ENCOUNTER — Other Ambulatory Visit: Payer: Self-pay | Admitting: Family Medicine

## 2020-12-13 ENCOUNTER — Telehealth (INDEPENDENT_AMBULATORY_CARE_PROVIDER_SITE_OTHER): Payer: Medicaid Other | Admitting: Family Medicine

## 2020-12-13 ENCOUNTER — Encounter: Payer: Self-pay | Admitting: Family Medicine

## 2020-12-13 DIAGNOSIS — Z09 Encounter for follow-up examination after completed treatment for conditions other than malignant neoplasm: Secondary | ICD-10-CM | POA: Diagnosis not present

## 2020-12-13 DIAGNOSIS — N920 Excessive and frequent menstruation with regular cycle: Secondary | ICD-10-CM

## 2020-12-13 DIAGNOSIS — J45909 Unspecified asthma, uncomplicated: Secondary | ICD-10-CM | POA: Diagnosis not present

## 2020-12-13 DIAGNOSIS — N946 Dysmenorrhea, unspecified: Secondary | ICD-10-CM

## 2020-12-13 DIAGNOSIS — E559 Vitamin D deficiency, unspecified: Secondary | ICD-10-CM

## 2020-12-13 DIAGNOSIS — D649 Anemia, unspecified: Secondary | ICD-10-CM

## 2020-12-13 MED ORDER — VITAMIN D (ERGOCALCIFEROL) 1.25 MG (50000 UNIT) PO CAPS: 50000.0000 [IU] | ORAL_CAPSULE | ORAL | 6 refills | Status: DC

## 2020-12-13 MED ORDER — FERROUS SULFATE 324 MG PO TBEC: 324.0000 mg | DELAYED_RELEASE_TABLET | Freq: Two times a day (BID) | ORAL | 1 refills | Status: DC

## 2020-12-13 NOTE — Telephone Encounter (Signed)
Scheduled patient for Televisit today.

## 2020-12-17 ENCOUNTER — Other Ambulatory Visit: Payer: Medicaid Other

## 2020-12-17 ENCOUNTER — Other Ambulatory Visit: Payer: Self-pay

## 2020-12-17 DIAGNOSIS — J45909 Unspecified asthma, uncomplicated: Secondary | ICD-10-CM | POA: Diagnosis not present

## 2020-12-17 DIAGNOSIS — D649 Anemia, unspecified: Secondary | ICD-10-CM | POA: Diagnosis not present

## 2020-12-18 ENCOUNTER — Other Ambulatory Visit: Payer: Self-pay | Admitting: Family Medicine

## 2020-12-18 ENCOUNTER — Encounter: Payer: Self-pay | Admitting: Family Medicine

## 2020-12-18 DIAGNOSIS — N946 Dysmenorrhea, unspecified: Secondary | ICD-10-CM

## 2020-12-18 DIAGNOSIS — D649 Anemia, unspecified: Secondary | ICD-10-CM

## 2020-12-18 DIAGNOSIS — N920 Excessive and frequent menstruation with regular cycle: Secondary | ICD-10-CM

## 2020-12-18 LAB — COMPREHENSIVE METABOLIC PANEL
ALT: 8 IU/L (ref 0–32)
AST: 12 IU/L (ref 0–40)
Albumin/Globulin Ratio: 1.5 (ref 1.2–2.2)
Albumin: 4.1 g/dL (ref 3.8–4.8)
Alkaline Phosphatase: 119 IU/L (ref 44–121)
BUN/Creatinine Ratio: 3 — ABNORMAL LOW (ref 9–23)
BUN: 3 mg/dL — ABNORMAL LOW (ref 6–24)
Bilirubin Total: 0.2 mg/dL (ref 0.0–1.2)
CO2: 22 mmol/L (ref 20–29)
Calcium: 8.8 mg/dL (ref 8.7–10.2)
Chloride: 105 mmol/L (ref 96–106)
Creatinine, Ser: 1.02 mg/dL — ABNORMAL HIGH (ref 0.57–1.00)
GFR calc Af Amer: 75 mL/min/{1.73_m2} (ref >59)
GFR calc non Af Amer: 65 mL/min/{1.73_m2} (ref >59)
Globulin, Total: 2.7 g/dL (ref 1.5–4.5)
Glucose: 98 mg/dL (ref 65–99)
Potassium: 3.6 mmol/L (ref 3.5–5.2)
Sodium: 138 mmol/L (ref 134–144)
Total Protein: 6.8 g/dL (ref 6.0–8.5)

## 2020-12-18 LAB — CBC WITH DIFFERENTIAL/PLATELET
Basophils Absolute: 0 10*3/uL (ref 0.0–0.2)
Basos: 0 %
EOS (ABSOLUTE): 0.2 10*3/uL (ref 0.0–0.4)
Eos: 2 %
Hematocrit: 27.1 % — ABNORMAL LOW (ref 34.0–46.6)
Hemoglobin: 7.2 g/dL — ABNORMAL LOW (ref 11.1–15.9)
Immature Grans (Abs): 0 10*3/uL (ref 0.0–0.1)
Immature Granulocytes: 0 %
Lymphocytes Absolute: 3.2 10*3/uL — ABNORMAL HIGH (ref 0.7–3.1)
Lymphs: 33 %
MCH: 17.4 pg — ABNORMAL LOW (ref 26.6–33.0)
MCHC: 26.6 g/dL — ABNORMAL LOW (ref 31.5–35.7)
MCV: 66 fL — ABNORMAL LOW (ref 79–97)
Monocytes Absolute: 0.7 10*3/uL (ref 0.1–0.9)
Monocytes: 8 %
Neutrophils Absolute: 5.5 10*3/uL (ref 1.4–7.0)
Neutrophils: 57 %
Platelets: 455 10*3/uL — ABNORMAL HIGH (ref 150–450)
RBC: 4.14 x10E6/uL (ref 3.77–5.28)
RDW: 18.8 % — ABNORMAL HIGH (ref 11.7–15.4)
WBC: 9.7 10*3/uL (ref 3.4–10.8)

## 2020-12-18 LAB — IRON AND TIBC
Iron Saturation: 25 % (ref 15–55)
Iron: 114 ug/dL (ref 27–159)
Total Iron Binding Capacity: 454 ug/dL — ABNORMAL HIGH (ref 250–450)
UIBC: 340 ug/dL (ref 131–425)

## 2020-12-20 ENCOUNTER — Ambulatory Visit (HOSPITAL_COMMUNITY)
Admission: RE | Admit: 2020-12-20 | Discharge: 2020-12-20 | Disposition: A | Discharge status: Home / Self Care | Payer: Medicaid Other | Source: Ambulatory Visit | Attending: Internal Medicine | Admitting: Internal Medicine

## 2020-12-20 ENCOUNTER — Other Ambulatory Visit: Payer: Self-pay

## 2020-12-20 ENCOUNTER — Telehealth: Payer: Self-pay | Admitting: Family Medicine

## 2020-12-20 DIAGNOSIS — N920 Excessive and frequent menstruation with regular cycle: Secondary | ICD-10-CM

## 2020-12-20 DIAGNOSIS — N946 Dysmenorrhea, unspecified: Secondary | ICD-10-CM | POA: Insufficient documentation

## 2020-12-20 DIAGNOSIS — D649 Anemia, unspecified: Secondary | ICD-10-CM | POA: Insufficient documentation

## 2020-12-20 LAB — PREPARE RBC (CROSSMATCH)

## 2020-12-20 LAB — HEMOGLOBIN AND HEMATOCRIT, BLOOD
HCT: 30.6 % — ABNORMAL LOW (ref 36.0–46.0)
Hemoglobin: 8.4 g/dL — ABNORMAL LOW (ref 12.0–15.0)

## 2020-12-20 MED ORDER — SODIUM CHLORIDE (PF) 0.9 % IJ SOLN: 10.0000 mL | Freq: Once | INTRAMUSCULAR | Status: DC

## 2020-12-20 MED ORDER — SODIUM CHLORIDE 0.9% IV SOLUTION: Freq: Once | INTRAVENOUS | Status: AC

## 2020-12-20 NOTE — Progress Notes (Signed)
PATIENT CARE CENTER NOTE  Diagnosis: Anemia    Provider: Kathe Becton, FNP   Procedure: 1 unit PRBC   Note: Patient received 1 unit of blood via PIV. Tolerated well. Vital signs remained stable. Post H and H drawn per order. Discharge instructions given. Patient alert, oriented and ambulatory at discharge.

## 2020-12-20 NOTE — Discharge Instructions (Signed)

## 2020-12-20 NOTE — Telephone Encounter (Signed)
Patient scheduled for foot surgery on 01/08/2021. Faxed all required information back to [facility]

## 2020-12-21 LAB — TYPE AND SCREEN
ABO/RH(D): A POS
Antibody Screen: NEGATIVE
Unit division: 0

## 2020-12-21 LAB — BPAM RBC
Blood Product Expiration Date: 202201242359
ISSUE DATE / TIME: 202201071008
Unit Type and Rh: 6200

## 2020-12-26 ENCOUNTER — Telehealth: Payer: Self-pay

## 2020-12-26 ENCOUNTER — Encounter: Payer: Self-pay | Admitting: Family Medicine

## 2020-12-26 NOTE — Progress Notes (Signed)
Virtual Visit via Telephone Note  I connected with Jasmin Stephenson on 12/26/20 at 11:40 AM EST by telephone and verified that I am speaking with the correct person using two identifiers.  Location: Patient: Home Provider: Office   I discussed the limitations, risks, security and privacy concerns of performing an evaluation and management service by telephone and the availability of in person appointments. I also discussed with the patient that there may be a patient responsible charge related to this service. The patient expressed understanding and agreed to proceed.   History of Present Illness:  Patient Active Problem List   Diagnosis Date Noted  . Tinea corporis 01/25/2019  . Cervical radiculopathy 09/21/2018  . History of fusion of cervical spine 09/21/2018  . Numbness and tingling 09/21/2018  . Neck pain 09/02/2018  . 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 Hair loss 01/16/2016  . Pain in joint, ankle and foot 01/16/2016    Current Status: Since her last office visit, she is doing well with no complaints. She continues to have increased bleeding and cramping during menstrual periods. Denies GI problems such as nausea, vomiting, diarrhea, and constipation. She has no reports of blood in stools, dysuria and hematuria. She denies fevers, chills, fatigue, recent infections, weight loss, and night sweats. She has not had any headaches, visual changes, dizziness, and falls. No chest pain, heart palpitations, cough and shortness of breath reported.  No depression or anxiety, and denies suicidal ideations, homicidal ideations, or auditory hallucinations. She is taking all medications as prescribed. She denies pain today.   Observations/Objective:  Telephone Virtual Visit   Assessment and Plan:  1. Mild asthma without complication, unspecified whether persistent No signs or symptoms of respiratory distress noted or reported.   2. Dysmenorrhea  3.  Menorrhagia with regular cycl  4. Follow up She will follow up 05/2021.  No orders of the defined types were placed in this encounter.  No orders of the defined types were placed in this encounter.   Referral Orders  No referral(s) requested today    Kathe Becton, MSN, ANE, FNP-BC [facility]/Internal [place] [address] [place], [place] [address] [phone]- fax       I discussed the assessment and treatment plan with the patient. The patient was provided an opportunity to ask questions and all were answered. The patient agreed with the plan and demonstrated an understanding of the instructions.   The patient was advised to call back or seek an in-person evaluation if the symptoms worsen or if the condition fails to improve as anticipated.  I provided 20 minutes of non-face-to-face time during this encounter.   [name], FNP

## 2020-12-26 NOTE — Telephone Encounter (Signed)
DOS 01/08/2021  WEBBING PROCEDURE RT - [id number]  [place], [id number]  Request Tracking ID [id number] No precertification or post-service clinical review is required for the specific codes you have requested. The patient must stay within network for highest benefit level if it is a plan with out of network benefits. It is important that you verify benefits by going to [email or web address] or calling Customer Service.

## 2021-01-03 ENCOUNTER — Ambulatory Visit: Payer: Medicaid Other | Admitting: Obstetrics & Gynecology

## 2021-01-03 ENCOUNTER — Telehealth (INDEPENDENT_AMBULATORY_CARE_PROVIDER_SITE_OTHER): Payer: Medicaid Other | Admitting: Family Medicine

## 2021-01-03 DIAGNOSIS — F419 Anxiety disorder, unspecified: Secondary | ICD-10-CM

## 2021-01-03 DIAGNOSIS — M79672 Pain in left foot: Secondary | ICD-10-CM

## 2021-01-03 DIAGNOSIS — D649 Anemia, unspecified: Secondary | ICD-10-CM | POA: Diagnosis not present

## 2021-01-03 DIAGNOSIS — L299 Pruritus, unspecified: Secondary | ICD-10-CM | POA: Diagnosis not present

## 2021-01-03 DIAGNOSIS — Z09 Encounter for follow-up examination after completed treatment for conditions other than malignant neoplasm: Secondary | ICD-10-CM | POA: Diagnosis not present

## 2021-01-03 DIAGNOSIS — G8929 Other chronic pain: Secondary | ICD-10-CM

## 2021-01-03 MED ORDER — TRIAMCINOLONE ACETONIDE 0.1 % EX CREA: 1.0000 "application " | TOPICAL_CREAM | Freq: Two times a day (BID) | CUTANEOUS | 1 refills | Status: DC

## 2021-01-03 MED ORDER — DIPHENHYDRAMINE HCL 50 MG PO TABS: 50.0000 mg | ORAL_TABLET | Freq: Every evening | ORAL | 2 refills | Status: DC | PRN

## 2021-01-03 NOTE — H&P (View-Only) (Signed)
Virtual Visit via Telephone Note  I connected with Jasmin Stephenson on 01/03/21 at 11:00 AM EST by telephone and verified that I am speaking with the correct person using two identifiers.  Location: Patient: Home Provider: Office   I discussed the limitations, risks, security and privacy concerns of performing an evaluation and management service by telephone and the availability of in person appointments. I also discussed with the patient that there may be a patient responsible charge related to this service. The patient expressed understanding and agreed to proceed.   History of Present Illness:  Patient Active Problem List   Diagnosis Date Noted  . Tinea corporis 01/25/2019  . Cervical radiculopathy 09/21/2018  . History of fusion of cervical spine 09/21/2018  . Numbness and tingling 09/21/2018  . Neck pain 09/02/2018  . 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 Hair loss 01/16/2016  . Pain in joint, ankle and foot 01/16/2016    Current Status: Since her last office visit, she continues to have c/o chronic left foot pain. She is scheduled for foot surgery on 01/08/2021. She is doing well with no complaints. She denies fevers, chills, fatigue, recent infections, weight loss, and night sweats. She has not had any headaches, visual changes, dizziness, and falls. No chest pain, heart palpitations, cough and shortness of breath reported. Denies GI problems such as nausea, vomiting, diarrhea, and constipation. She has no reports of blood in stools, dysuria and hematuria. No depression or anxiety reported today. She is taking all medications as prescribed.   Observations/Objective:  Telephone Virtual Visit  Assessment and Plan:  1. Chronic foot pain, left She is scheduled for surgery on 01/08/2021.  2. Anemia, unspecified type - CBC with Differential; Future - Comprehensive metabolic panel; Future - Iron and TIBC([facility]); Future  3. Itching -  triamcinolone (KENALOG) 0.1 %; Apply 1 application topically 2 (two) times daily.  Dispense: 30 g; Refill: 1 - diphenhydrAMINE (BENADRYL) 50 MG tablet; Take 1 tablet (50 mg total) by mouth at bedtime as needed for itching.  Dispense: 30 tablet; Refill: 2  4. Anxiety  5. Follow up She will keep scheduled follow up appointment.   Meds ordered this encounter  Medications  . triamcinolone (KENALOG) 0.1 %    Sig: Apply 1 application topically 2 (two) times daily.    Dispense:  30 g    Refill:  1  . diphenhydrAMINE (BENADRYL) 50 MG tablet    Sig: Take 1 tablet (50 mg total) by mouth at bedtime as needed for itching.    Dispense:  30 tablet    Refill:  2    Orders Placed This Encounter  Procedures  . CBC with Differential  . Comprehensive metabolic panel  . Iron and TIBC([facility])    Referral Orders  No referral(s) requested today    Kathe Becton, MSN, ANE, FNP-BC [facility]/Internal [place] [address] [place], [place] [address] [phone]- fax     I discussed the assessment and treatment plan with the patient. The patient was provided an opportunity to ask questions and all were answered. The patient agreed with the plan and demonstrated an understanding of the instructions.   The patient was advised to call back or seek an in-person evaluation if the symptoms worsen or if the condition fails to improve as anticipated.  I provided 20 minutes of non-face-to-face time during this encounter.   [name], FNP

## 2021-01-03 NOTE — Progress Notes (Signed)
Virtual Visit via Telephone Note  I connected with Jasmin Stephenson on 01/03/21 at 11:00 AM EST by telephone and verified that I am speaking with the correct person using two identifiers.  Location: Patient: Home Provider: Office   I discussed the limitations, risks, security and privacy concerns of performing an evaluation and management service by telephone and the availability of in person appointments. I also discussed with the patient that there may be a patient responsible charge related to this service. The patient expressed understanding and agreed to proceed.   History of Present Illness:  Patient Active Problem List   Diagnosis Date Noted  . Tinea corporis 01/25/2019  . Cervical radiculopathy 09/21/2018  . History of fusion of cervical spine 09/21/2018  . Numbness and tingling 09/21/2018  . Neck pain 09/02/2018  . 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 Hair loss 01/16/2016  . Pain in joint, ankle and foot 01/16/2016    Current Status: Since her last office visit, she continues to have c/o chronic left foot pain. She is scheduled for foot surgery on 01/08/2021. She is doing well with no complaints. She denies fevers, chills, fatigue, recent infections, weight loss, and night sweats. She has not had any headaches, visual changes, dizziness, and falls. No chest pain, heart palpitations, cough and shortness of breath reported. Denies GI problems such as nausea, vomiting, diarrhea, and constipation. She has no reports of blood in stools, dysuria and hematuria. No depression or anxiety reported today. She is taking all medications as prescribed.   Observations/Objective:  Telephone Virtual Visit  Assessment and Plan:  1. Chronic foot pain, left She is scheduled for surgery on 01/08/2021.  2. Anemia, unspecified type - CBC with Differential; Future - Comprehensive metabolic panel; Future - Iron and TIBC([facility]); Future  3. Itching -  triamcinolone (KENALOG) 0.1 %; Apply 1 application topically 2 (two) times daily.  Dispense: 30 g; Refill: 1 - diphenhydrAMINE (BENADRYL) 50 MG tablet; Take 1 tablet (50 mg total) by mouth at bedtime as needed for itching.  Dispense: 30 tablet; Refill: 2  4. Anxiety  5. Follow up She will keep scheduled follow up appointment.   Meds ordered this encounter  Medications  . triamcinolone (KENALOG) 0.1 %    Sig: Apply 1 application topically 2 (two) times daily.    Dispense:  30 g    Refill:  1  . diphenhydrAMINE (BENADRYL) 50 MG tablet    Sig: Take 1 tablet (50 mg total) by mouth at bedtime as needed for itching.    Dispense:  30 tablet    Refill:  2    Orders Placed This Encounter  Procedures  . CBC with Differential  . Comprehensive metabolic panel  . Iron and TIBC([facility])    Referral Orders  No referral(s) requested today    Tami Barren, MSN, ANE, FNP-BC [facility] [address]  [place], [place] [address] [phone] fax     I discussed the assessment and treatment plan with the patient. The patient was provided an opportunity to ask questions and all were answered. The patient agreed with the plan and demonstrated an understanding of the instructions.   The patient was advised to call back or seek an in-person evaluation if the symptoms worsen or if the condition fails to improve as anticipated.  I provided 20 minutes of non-face-to-face time during this encounter.   Jewels Langone M Daylee Delahoz, FNP   

## 2021-01-04 ENCOUNTER — Other Ambulatory Visit: Payer: Self-pay | Admitting: Family Medicine

## 2021-01-04 DIAGNOSIS — D649 Anemia, unspecified: Secondary | ICD-10-CM

## 2021-01-06 ENCOUNTER — Other Ambulatory Visit: Payer: Self-pay

## 2021-01-06 ENCOUNTER — Other Ambulatory Visit: Payer: Medicaid Other

## 2021-01-06 ENCOUNTER — Encounter (HOSPITAL_BASED_OUTPATIENT_CLINIC_OR_DEPARTMENT_OTHER): Payer: Self-pay | Admitting: Podiatry

## 2021-01-06 NOTE — Progress Notes (Addendum)
Spoke w/ via phone for pre-op interview---pt Lab needs dos----       ekg           Lab results------cbc with dif, cmet, iron and tibc 01-06-2021 epic COVID test -----01-07-2021 1030- Arrive at -------1000 am 01-07-2021 NPO after MN NO Solid Food.  Clear liquids from MN until---900 am then npo Medications to take morning of surgery -----albuterol nebulizer, albuterol inhaler prn/bring inhaler, rosuvastatin, pletal Diabetic medication -----n/a Patient Special Instructions -----none Pre-Op special Istructions -----none Patient verbalized understanding of instructions that were given at this phone interview. Patient denies shortness of breath, chest pain, fever, cough at this phone interview.  Addendum: H & P received natalie stroud fnp dated 01-03-2021 on chart and in epic

## 2021-01-06 NOTE — Telephone Encounter (Signed)
Please see patient request for refill.

## 2021-01-07 ENCOUNTER — Other Ambulatory Visit: Payer: Medicaid Other

## 2021-01-07 ENCOUNTER — Encounter: Payer: Self-pay | Admitting: Family Medicine

## 2021-01-07 ENCOUNTER — Ambulatory Visit: Payer: Self-pay | Admitting: Podiatry

## 2021-01-07 ENCOUNTER — Other Ambulatory Visit (HOSPITAL_COMMUNITY)
Admission: RE | Admit: 2021-01-07 | Discharge: 2021-01-07 | Disposition: A | Discharge status: Home / Self Care | Payer: Medicaid Other | Source: Ambulatory Visit | Attending: Podiatry | Admitting: Podiatry

## 2021-01-07 ENCOUNTER — Other Ambulatory Visit: Payer: Self-pay

## 2021-01-07 DIAGNOSIS — Z01812 Encounter for preprocedural laboratory examination: Secondary | ICD-10-CM | POA: Insufficient documentation

## 2021-01-07 DIAGNOSIS — M2041 Other hammer toe(s) (acquired), right foot: Secondary | ICD-10-CM

## 2021-01-07 DIAGNOSIS — Z20822 Contact with and (suspected) exposure to covid-19: Secondary | ICD-10-CM | POA: Insufficient documentation

## 2021-01-07 LAB — SARS CORONAVIRUS 2 (TAT 6-24 HRS): SARS Coronavirus 2: NEGATIVE

## 2021-01-08 ENCOUNTER — Other Ambulatory Visit: Payer: Self-pay | Admitting: Family Medicine

## 2021-01-08 ENCOUNTER — Encounter (HOSPITAL_BASED_OUTPATIENT_CLINIC_OR_DEPARTMENT_OTHER)
Admission: RE | Disposition: A | Discharge status: Home / Self Care | Payer: Self-pay | Source: Home / Self Care | Attending: Podiatry

## 2021-01-08 ENCOUNTER — Other Ambulatory Visit: Payer: Self-pay

## 2021-01-08 ENCOUNTER — Ambulatory Visit (HOSPITAL_BASED_OUTPATIENT_CLINIC_OR_DEPARTMENT_OTHER): Payer: Medicaid Other | Admitting: Anesthesiology

## 2021-01-08 ENCOUNTER — Ambulatory Visit (HOSPITAL_BASED_OUTPATIENT_CLINIC_OR_DEPARTMENT_OTHER)
Admission: RE | Admit: 2021-01-08 | Discharge: 2021-01-08 | Disposition: A | Discharge status: Home / Self Care | Payer: Medicaid Other | Attending: Podiatry | Admitting: Podiatry

## 2021-01-08 ENCOUNTER — Encounter (HOSPITAL_BASED_OUTPATIENT_CLINIC_OR_DEPARTMENT_OTHER): Payer: Self-pay | Admitting: Podiatry

## 2021-01-08 DIAGNOSIS — M79672 Pain in left foot: Secondary | ICD-10-CM | POA: Insufficient documentation

## 2021-01-08 DIAGNOSIS — M2061 Acquired deformities of toe(s), unspecified, right foot: Secondary | ICD-10-CM | POA: Diagnosis not present

## 2021-01-08 DIAGNOSIS — L299 Pruritus, unspecified: Secondary | ICD-10-CM | POA: Insufficient documentation

## 2021-01-08 DIAGNOSIS — D649 Anemia, unspecified: Secondary | ICD-10-CM | POA: Diagnosis not present

## 2021-01-08 DIAGNOSIS — F419 Anxiety disorder, unspecified: Secondary | ICD-10-CM | POA: Diagnosis not present

## 2021-01-08 DIAGNOSIS — K219 Gastro-esophageal reflux disease without esophagitis: Secondary | ICD-10-CM | POA: Diagnosis not present

## 2021-01-08 DIAGNOSIS — E559 Vitamin D deficiency, unspecified: Secondary | ICD-10-CM | POA: Diagnosis not present

## 2021-01-08 DIAGNOSIS — J45909 Unspecified asthma, uncomplicated: Secondary | ICD-10-CM | POA: Diagnosis not present

## 2021-01-08 DIAGNOSIS — G8929 Other chronic pain: Secondary | ICD-10-CM | POA: Insufficient documentation

## 2021-01-08 DIAGNOSIS — Q7031 Webbed toes, right foot: Secondary | ICD-10-CM | POA: Diagnosis not present

## 2021-01-08 DIAGNOSIS — M2041 Other hammer toe(s) (acquired), right foot: Secondary | ICD-10-CM

## 2021-01-08 HISTORY — DX: Family history of other specified conditions: Z84.89

## 2021-01-08 HISTORY — DX: Fracture of neck, unspecified, initial encounter: S12.9XXA

## 2021-01-08 HISTORY — PX: SYNDACTYLIZATION: SHX6627

## 2021-01-08 LAB — POCT I-STAT, CHEM 8
BUN: <3 mg/dL — ABNORMAL LOW (ref 6–20)
Calcium, Ion: 1.25 mmol/L (ref 1.15–1.40)
Chloride: 103 mmol/L (ref 98–111)
Creatinine, Ser: 0.8 mg/dL (ref 0.44–1.00)
Glucose, Bld: 112 mg/dL — ABNORMAL HIGH (ref 70–99)
HCT: 35 % — ABNORMAL LOW (ref 36.0–46.0)
Hemoglobin: 11.9 g/dL — ABNORMAL LOW (ref 12.0–15.0)
Potassium: 3.5 mmol/L (ref 3.5–5.1)
Sodium: 140 mmol/L (ref 135–145)
TCO2: 23 mmol/L (ref 22–32)

## 2021-01-08 LAB — POCT PREGNANCY, URINE: Preg Test, Ur: NEGATIVE

## 2021-01-08 SURGERY — SYNDACTYLIZATION
Anesthesia: Monitor Anesthesia Care | Site: Foot | Laterality: Right

## 2021-01-08 MED ORDER — CLINDAMYCIN HCL 150 MG PO CAPS: 150.0000 mg | ORAL_CAPSULE | Freq: Two times a day (BID) | ORAL | 0 refills | Status: DC

## 2021-01-08 MED ORDER — CLINDAMYCIN PHOSPHATE 900 MG/50ML IV SOLN
INTRAVENOUS | Status: AC
  Filled 2021-01-08: qty 50

## 2021-01-08 MED ORDER — SCOPOLAMINE 1 MG/3DAYS TD PT72
1.0000 | MEDICATED_PATCH | TRANSDERMAL | Status: DC
  Administered 2021-01-08: 1.5 mg via TRANSDERMAL

## 2021-01-08 MED ORDER — FENTANYL CITRATE (PF) 100 MCG/2ML IJ SOLN
INTRAMUSCULAR | Status: DC | PRN
  Administered 2021-01-08: 50 ug via INTRAVENOUS
  Administered 2021-01-08: 25 ug via INTRAVENOUS

## 2021-01-08 MED ORDER — LACTATED RINGERS IV SOLN: INTRAVENOUS | Status: DC

## 2021-01-08 MED ORDER — ONDANSETRON HCL 4 MG PO TABS: 4.0000 mg | ORAL_TABLET | Freq: Three times a day (TID) | ORAL | 0 refills | Status: DC | PRN

## 2021-01-08 MED ORDER — ACETAMINOPHEN 500 MG PO TABS
ORAL_TABLET | ORAL | Status: AC
  Filled 2021-01-08: qty 2

## 2021-01-08 MED ORDER — OXYCODONE-ACETAMINOPHEN 5-325 MG PO TABS: 1.0000 | ORAL_TABLET | ORAL | 0 refills | Status: DC | PRN

## 2021-01-08 MED ORDER — SCOPOLAMINE 1 MG/3DAYS TD PT72
MEDICATED_PATCH | TRANSDERMAL | Status: AC
  Filled 2021-01-08: qty 1

## 2021-01-08 MED ORDER — BUPIVACAINE HCL (PF) 0.5 % IJ SOLN
INTRAMUSCULAR | Status: DC | PRN
  Administered 2021-01-08: 10 mL

## 2021-01-08 MED ORDER — LIDOCAINE HCL (CARDIAC) PF 100 MG/5ML IV SOSY
PREFILLED_SYRINGE | INTRAVENOUS | Status: DC | PRN
  Administered 2021-01-08: 60 mg via INTRAVENOUS

## 2021-01-08 MED ORDER — MIDAZOLAM HCL 5 MG/5ML IJ SOLN
INTRAMUSCULAR | Status: DC | PRN
  Administered 2021-01-08: 2 mg via INTRAVENOUS

## 2021-01-08 MED ORDER — ONDANSETRON HCL 4 MG/2ML IJ SOLN
INTRAMUSCULAR | Status: DC | PRN
  Administered 2021-01-08: 4 mg via INTRAVENOUS

## 2021-01-08 MED ORDER — FENTANYL CITRATE (PF) 100 MCG/2ML IJ SOLN: 25.0000 ug | INTRAMUSCULAR | Status: DC | PRN

## 2021-01-08 MED ORDER — MIDAZOLAM HCL 2 MG/2ML IJ SOLN
INTRAMUSCULAR | Status: AC
  Filled 2021-01-08: qty 2

## 2021-01-08 MED ORDER — PROPOFOL 10 MG/ML IV BOLUS
INTRAVENOUS | Status: DC | PRN
  Administered 2021-01-08 (×4): 10 mg via INTRAVENOUS

## 2021-01-08 MED ORDER — DEXMEDETOMIDINE (PRECEDEX) IN NS 20 MCG/5ML (4 MCG/ML) IV SYRINGE
PREFILLED_SYRINGE | INTRAVENOUS | Status: DC | PRN
  Administered 2021-01-08: 8 ug via INTRAVENOUS
  Administered 2021-01-08: 4 ug via INTRAVENOUS

## 2021-01-08 MED ORDER — DEXAMETHASONE SODIUM PHOSPHATE 10 MG/ML IJ SOLN
INTRAMUSCULAR | Status: DC | PRN
  Administered 2021-01-08: 4 mg via INTRAVENOUS

## 2021-01-08 MED ORDER — LIDOCAINE HCL 1 % IJ SOLN
INTRAMUSCULAR | Status: DC | PRN
  Administered 2021-01-08: 10 mL

## 2021-01-08 MED ORDER — CLINDAMYCIN PHOSPHATE 900 MG/50ML IV SOLN
900.0000 mg | INTRAVENOUS | Status: AC
  Administered 2021-01-08: 900 mg via INTRAVENOUS

## 2021-01-08 MED ORDER — FENTANYL CITRATE (PF) 100 MCG/2ML IJ SOLN
INTRAMUSCULAR | Status: AC
  Filled 2021-01-08: qty 2

## 2021-01-08 MED ORDER — ACETAMINOPHEN 500 MG PO TABS
1000.0000 mg | ORAL_TABLET | Freq: Once | ORAL | Status: AC
  Administered 2021-01-08: 1000 mg via ORAL

## 2021-01-08 SURGICAL SUPPLY — 64 items
ADH SKN CLS APL DERMABOND .7 (GAUZE/BANDAGES/DRESSINGS)
APL PRP STRL LF DISP 70% ISPRP (MISCELLANEOUS) ×1
BANDAGE ESMARK 6X9 LF (GAUZE/BANDAGES/DRESSINGS) ×1 IMPLANT
BLADE HEX COATED 2.75 (ELECTRODE) ×2 IMPLANT
BLADE SURG 15 STRL LF DISP TIS (BLADE) ×3 IMPLANT
BLADE SURG 15 STRL SS (BLADE) ×6
BNDG CMPR 9X6 STRL LF SNTH (GAUZE/BANDAGES/DRESSINGS) ×1
BNDG ELASTIC 3X5.8 VLCR STR LF (GAUZE/BANDAGES/DRESSINGS) ×2 IMPLANT
BNDG ELASTIC 4X5.8 VLCR STR LF (GAUZE/BANDAGES/DRESSINGS) ×2 IMPLANT
BNDG ESMARK 6X9 LF (GAUZE/BANDAGES/DRESSINGS) ×2
BNDG GAUZE ELAST 4 BULKY (GAUZE/BANDAGES/DRESSINGS) ×2 IMPLANT
CHLORAPREP W/TINT 26 (MISCELLANEOUS) ×2 IMPLANT
COVER BACK TABLE 60X90IN (DRAPES) ×2 IMPLANT
COVER WAND RF STERILE (DRAPES) IMPLANT
CUFF TOURN SGL QUICK 18X4 (TOURNIQUET CUFF) ×2 IMPLANT
CUFF TOURN SGL QUICK 24 (TOURNIQUET CUFF)
CUFF TRNQT CYL 24X4X16.5-23 (TOURNIQUET CUFF) IMPLANT
DERMABOND ADVANCED (GAUZE/BANDAGES/DRESSINGS)
DERMABOND ADVANCED .7 DNX12 (GAUZE/BANDAGES/DRESSINGS) IMPLANT
DRAPE 3/4 80X56 (DRAPES) ×2 IMPLANT
DRAPE EXTREMITY T 121X128X90 (DISPOSABLE) ×2 IMPLANT
DRAPE U-SHAPE 47X51 STRL (DRAPES) ×2 IMPLANT
DRSG EMULSION OIL 3X3 NADH (GAUZE/BANDAGES/DRESSINGS) ×2 IMPLANT
DRSG PAD ABDOMINAL 8X10 ST (GAUZE/BANDAGES/DRESSINGS) ×2 IMPLANT
ELECT REM PT RETURN 9FT ADLT (ELECTROSURGICAL)
ELECTRODE REM PT RTRN 9FT ADLT (ELECTROSURGICAL) IMPLANT
GAUZE SPONGE 4X4 12PLY STRL (GAUZE/BANDAGES/DRESSINGS) ×4 IMPLANT
GAUZE SPONGE 4X4 12PLY STRL LF (GAUZE/BANDAGES/DRESSINGS) ×2 IMPLANT
GAUZE XEROFORM 1X8 LF (GAUZE/BANDAGES/DRESSINGS) ×2 IMPLANT
GLOVE BIO SURGEON STRL SZ7.5 (GLOVE) ×2 IMPLANT
GLOVE SRG 8 PF TXTR STRL LF DI (GLOVE) ×1 IMPLANT
GLOVE SURG UNDER POLY LF SZ8 (GLOVE) ×2
GOWN STRL REUS W/ TWL XL LVL3 (GOWN DISPOSABLE) ×1 IMPLANT
GOWN STRL REUS W/TWL XL LVL3 (GOWN DISPOSABLE) ×2
KIT TURNOVER CYSTO (KITS) ×2 IMPLANT
NEEDLE HYPO 25X1 1.5 SAFETY (NEEDLE) ×2 IMPLANT
NS IRRIG 1000ML POUR BTL (IV SOLUTION) ×2 IMPLANT
PACK BASIN DAY SURGERY FS (CUSTOM PROCEDURE TRAY) ×2 IMPLANT
PAD CAST 4YDX4 CTTN HI CHSV (CAST SUPPLIES) IMPLANT
PADDING CAST ABS 4INX4YD NS (CAST SUPPLIES) ×1
PADDING CAST ABS COTTON 4X4 ST (CAST SUPPLIES) ×1 IMPLANT
PADDING CAST COTTON 4X4 STRL (CAST SUPPLIES)
PENCIL SMOKE EVACUATOR (MISCELLANEOUS) IMPLANT
SLEEVE SCD COMPRESS KNEE MED (MISCELLANEOUS) IMPLANT
STOCKINETTE 6  STRL (DRAPES) ×2
STOCKINETTE 6 STRL (DRAPES) ×1 IMPLANT
SUCTION FRAZIER HANDLE 10FR (MISCELLANEOUS) ×2
SUCTION TUBE FRAZIER 10FR DISP (MISCELLANEOUS) ×1 IMPLANT
SUT ETHIBOND 2-0 V-5 NEEDLE (SUTURE) ×2 IMPLANT
SUT ETHILON 3 0 PS 1 (SUTURE) ×2 IMPLANT
SUT MERSILENE 4 0 P 3 (SUTURE) IMPLANT
SUT MON AB 4-0 PS1 27 (SUTURE) ×6 IMPLANT
SUT MON AB 5-0 PS2 18 (SUTURE) ×2 IMPLANT
SUT SILK 3 0 SH 30 (SUTURE) ×2 IMPLANT
SUT VIC AB 0 CT1 36 (SUTURE) IMPLANT
SUT VIC AB 2-0 SH 27 (SUTURE)
SUT VIC AB 2-0 SH 27XBRD (SUTURE) IMPLANT
SUT VIC AB 3-0 FS2 27 (SUTURE) ×2 IMPLANT
SUT VIC AB 4-0 PS2 18 (SUTURE) IMPLANT
SYR BULB EAR ULCER 3OZ GRN STR (SYRINGE) ×2 IMPLANT
SYR CONTROL 10ML LL (SYRINGE) ×4 IMPLANT
TOWEL OR 17X26 10 PK STRL BLUE (TOWEL DISPOSABLE) ×2 IMPLANT
TUBE CONNECTING 12X1/4 (SUCTIONS) ×2 IMPLANT
UNDERPAD 30X36 HEAVY ABSORB (UNDERPADS AND DIAPERS) ×2 IMPLANT

## 2021-01-08 NOTE — Anesthesia Preprocedure Evaluation (Addendum)
Anesthesia Evaluation  Patient identified by MRN, date of birth, ID band Patient awake    Reviewed: Allergy & Precautions, NPO status , Patient's Chart, lab work & pertinent test results  Airway Mallampati: I  TM Distance: >3 FB Neck ROM: Full    Dental  (+) Dental Advisory Given, Chipped,    Pulmonary asthma , Current Smoker and Patient abstained from smoking.,    Pulmonary exam normal breath sounds clear to auscultation       Cardiovascular + Past MI  Normal cardiovascular exam Rhythm:Regular Rate:Normal     Neuro/Psych PSYCHIATRIC DISORDERS Anxiety Depression negative neurological ROS     GI/Hepatic Neg liver ROS, GERD  Controlled,  Endo/Other  negative endocrine ROS  Renal/GU negative Renal ROS  negative genitourinary   Musculoskeletal  (+) Arthritis ,   Abdominal   Peds  Hematology  (+) Blood dyscrasia (Hgb 8.4), anemia ,   Anesthesia Other Findings   Reproductive/Obstetrics                            Anesthesia Physical Anesthesia Plan  ASA: II  Anesthesia Plan: MAC   Post-op Pain Management:    Induction: Intravenous  PONV Risk Score and Plan: Propofol infusion and Treatment may vary due to age or medical condition  Airway Management Planned: Natural Airway  Additional Equipment:   Intra-op Plan:   Post-operative Plan:   Informed Consent: I have reviewed the patients History and Physical, chart, labs and discussed the procedure including the risks, benefits and alternatives for the proposed anesthesia with the patient or authorized representative who has indicated his/her understanding and acceptance.     Dental advisory given  Plan Discussed with: CRNA  Anesthesia Plan Comments:         Anesthesia Quick Evaluation

## 2021-01-08 NOTE — Transfer of Care (Signed)
Immediate Anesthesia Transfer of Care Note  Patient: Jasmin Stephenson  Procedure(s) Performed: SYNDACTYLIZATION (Right Foot)  Patient Location: PACU  Anesthesia Type:MAC  Level of Consciousness: awake  Airway & Oxygen Therapy: Patient Spontanous Breathing and Patient connected to face mask oxygen  Post-op Assessment: Report given to RN and Post -op Vital signs reviewed and stable  Post vital signs: Reviewed and stable  Last Vitals:  Vitals Value Taken Time  BP 100/75 01/08/21 1346  Temp    Pulse    Resp 20 01/08/21 1348  SpO2    Vitals shown include unvalidated device data.  Last Pain:  Vitals:   01/08/21 1028  TempSrc: Oral  PainSc: 0-No pain      Patients Stated Pain Goal: 6 (61/51/83 4373)  Complications: No complications documented.

## 2021-01-08 NOTE — Interval H&P Note (Signed)
History and Physical Interval Note:  01/08/2021 12:38 PM  Jasmin Stephenson  has presented today for surgery, with the diagnosis of DEFORMITY OF TOE.  The various methods of treatment have been discussed with the patient and family. After consideration of risks, benefits and other options for treatment, the patient has consented to  Procedure(s): SYNDACTYLIZATION (Right) as a surgical intervention.  The patient's history has been reviewed, patient examined, no change in status, stable for surgery.  I have reviewed the patient's chart and labs.  Questions were answered to the patient's satisfaction.     Evelina Bucy

## 2021-01-08 NOTE — H&P (Signed)
Anesthesia H&P Update: History and Physical Exam reviewed; patient is OK for planned anesthetic and procedure. ? ?

## 2021-01-08 NOTE — Discharge Instructions (Signed)
  After Surgery Instructions   1) If you are recuperating from surgery anywhere other than home, please be sure to leave [place] the number where you can be reached.  2) Go directly home and rest.  3) Keep the operated foot(feet) elevated six inches above the hip when sitting or lying down. This will help control swelling and pain.  4) Support the elevated foot and leg with pillows. DO NOT PLACE PILLOWS UNDER THE KNEE.  5) DO NOT REMOVE or get your bandages WET, unless you were given different instructions by your doctor to do so. This increases the risk of infection.  6) Wear your surgical shoe or surgical boot at all times when you are up on your feet.  7) A limited amount of pain and swelling may occur. The skin may take on a bruised appearance. DO NOT BE ALARMED, THIS IS NORMAL.  8) For slight pain and swelling, apply an ice pack directly over the bandages for 15 minutes only out of each hour of the day. Continue until seen in the office for your first post op visit. DO NOT APPLY ANY FORM OF HEAT TO THE AREA.  9) Have prescriptions filled immediately and take as directed. You may take prescribed medicine for pain or tylenol. You may have tylenol again at 4:20p  10) Drink lots of liquids, water and juice to stay hydrated.  11) CALL IMMEDIATELY IF:  *Bleeding continues until the following day of surgery  *Pain increases and/or does not respond to medication  *Bandages or cast appears to tight  *If your bandage gets wet  *Trip, fall or stump your surgical foot  *If your temperature goes above 101  *If you have ANY questions at all  If you need to reach the nurse for any reason, please call: [place]: [phone] [place]: [phone] [place]: [phone]  Post Anesthesia Home Care Instructions  Activity: Get plenty of rest for the remainder of the day. A responsible individual must stay with you for 24 hours following the procedure.  For the next 24  hours, DO NOT: -Drive a car -Paediatric nurse -Drink alcoholic beverages -Take any medication unless instructed by your physician -Make any legal decisions or sign important papers.  Meals: Start with liquid foods such as gelatin or soup. Progress to regular foods as tolerated. Avoid greasy, spicy, heavy foods. If nausea and/or vomiting occur, drink only clear liquids until the nausea and/or vomiting subsides. Call your physician if vomiting continues.  Special Instructions/Symptoms: Your throat may feel dry or sore from the anesthesia or the breathing tube placed in your throat during surgery. If this causes discomfort, gargle with warm salt water. The discomfort should disappear within 24 hours.  If you had a scopolamine patch placed behind your ear for the management of post- operative nausea and/or vomiting:  1. The medication in the patch is effective for 72 hours, after which it should be removed.  Wrap patch in a tissue and discard in the trash. Wash hands thoroughly with soap and water. 2. You may remove the patch earlier than 72 hours if you experience unpleasant side effects which may include dry mouth, dizziness or visual disturbances. 3. Avoid touching the patch. Wash your hands with soap and water after contact with the patch.  4. Please remove by Saturday, 01/11/2021

## 2021-01-08 NOTE — Brief Op Note (Signed)
01/08/2021  1:46 PM  PATIENT:  Jasmin Stephenson  50 y.o. female  PRE-OPERATIVE DIAGNOSIS:  DEFORMITY OF TOE  POST-OPERATIVE DIAGNOSIS:  DEFORMITY OF TOE  PROCEDURE:  Procedure(s): SYNDACTYLIZATION (Right)  SURGEON:  Surgeon(s) and Role:    * Evelina Bucy, DPM - Primary  PHYSICIAN ASSISTANT:   ASSISTANTS: none   ANESTHESIA:   local and MAC  EBL:  5 ml   BLOOD ADMINISTERED:none  DRAINS: none   LOCAL MEDICATIONS USED:  MARCAINE    and Amount: 10 marcaine, 10 lidocaine ml  SPECIMEN:  No Specimen  DISPOSITION OF SPECIMEN:  N/A  COUNTS:  YES  TOURNIQUET:   Total Tourniquet Time Documented: Calf (Right) - 15 minutes Total: Calf (Right) - 15 minutes   DICTATION: .Viviann Spare Dictation  PLAN OF CARE: Discharge to home after PACU  PATIENT DISPOSITION:  PACU - hemodynamically stable.   Delay start of Pharmacological VTE agent (>24hrs) due to surgical blood loss or risk of bleeding: not applicable

## 2021-01-09 ENCOUNTER — Encounter (HOSPITAL_BASED_OUTPATIENT_CLINIC_OR_DEPARTMENT_OTHER): Payer: Self-pay | Admitting: Podiatry

## 2021-01-09 NOTE — Anesthesia Postprocedure Evaluation (Signed)
Anesthesia Post Note  Patient: Jasmin Stephenson  Procedure(s) Performed: SYNDACTYLIZATION (Right Foot)     Patient location during evaluation: PACU Anesthesia Type: MAC Level of consciousness: awake and alert Pain management: pain level controlled Vital Signs Assessment: post-procedure vital signs reviewed and stable Respiratory status: spontaneous breathing, nonlabored ventilation, respiratory function stable and patient connected to nasal cannula oxygen Cardiovascular status: stable and blood pressure returned to baseline Postop Assessment: no apparent nausea or vomiting Anesthetic complications: no   No complications documented.  Last Vitals:  Vitals:   01/08/21 1430 01/08/21 1515  BP: 127/84 135/85  Pulse:  62  Resp: 14 18  Temp: 36.7 C 36.7 C  SpO2: 99% 98%    Last Pain:  Vitals:   01/08/21 1530  TempSrc:   PainSc: 0-No pain   Pain Goal: Patients Stated Pain Goal: 6 (01/08/21 1028)                 [name] L Yue Flanigan

## 2021-01-10 ENCOUNTER — Telehealth: Payer: Medicaid Other | Admitting: Family Medicine

## 2021-01-14 ENCOUNTER — Ambulatory Visit (INDEPENDENT_AMBULATORY_CARE_PROVIDER_SITE_OTHER): Payer: Medicaid Other | Admitting: Podiatry

## 2021-01-14 ENCOUNTER — Other Ambulatory Visit: Payer: Self-pay | Admitting: Podiatry

## 2021-01-14 ENCOUNTER — Other Ambulatory Visit: Payer: Self-pay

## 2021-01-14 DIAGNOSIS — Z9889 Other specified postprocedural states: Secondary | ICD-10-CM

## 2021-01-14 DIAGNOSIS — M2041 Other hammer toe(s) (acquired), right foot: Secondary | ICD-10-CM

## 2021-01-14 NOTE — Progress Notes (Signed)
  Subjective:  Patient ID: Jasmin Stephenson, female    DOB: 02/03/1971,  MRN: [id number]  Chief Complaint  Patient presents with  . 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 female presents with the above complaint. History confirmed with patient.   Objective:  Physical Exam: tenderness at the surgical site, local edema noted and calf supple, nontender. Incision: healing well, no significant drainage, no dehiscence, no significant erythema  Assessment:   1. Hammer toe of right foot   2. Post-operative state     Plan:  Patient was evaluated and treated and all questions answered.  Post-operative State -Dressing applied consisting of povidone, sterile gauze, kerlix and ACE bandage -WBAT in Surgical shoe  -Ok to remove dressing and shower in 1 week.  Return in about 2 weeks (around 01/28/2021) for Post-Op (No XRs).

## 2021-01-21 ENCOUNTER — Encounter: Payer: Medicaid Other | Admitting: Podiatry

## 2021-01-23 ENCOUNTER — Other Ambulatory Visit (HOSPITAL_COMMUNITY)
Admission: RE | Admit: 2021-01-23 | Discharge: 2021-01-23 | Disposition: A | Discharge status: Home / Self Care | Payer: Medicaid Other | Source: Ambulatory Visit | Attending: Obstetrics & Gynecology | Admitting: Obstetrics & Gynecology

## 2021-01-23 ENCOUNTER — Ambulatory Visit (INDEPENDENT_AMBULATORY_CARE_PROVIDER_SITE_OTHER): Payer: Medicaid Other | Admitting: Obstetrics and Gynecology

## 2021-01-23 ENCOUNTER — Other Ambulatory Visit: Payer: Self-pay

## 2021-01-23 ENCOUNTER — Encounter: Payer: Self-pay | Admitting: Obstetrics and Gynecology

## 2021-01-23 DIAGNOSIS — Z01419 Encounter for gynecological examination (general) (routine) without abnormal findings: Secondary | ICD-10-CM | POA: Diagnosis not present

## 2021-01-23 DIAGNOSIS — I252 Old myocardial infarction: Secondary | ICD-10-CM | POA: Insufficient documentation

## 2021-01-23 DIAGNOSIS — N920 Excessive and frequent menstruation with regular cycle: Secondary | ICD-10-CM | POA: Insufficient documentation

## 2021-01-23 LAB — POCT URINE PREGNANCY: Preg Test, Ur: NEGATIVE

## 2021-01-23 NOTE — Progress Notes (Signed)
ENDOMETRIAL BIOPSY      Jasmin Stephenson is a 50 y.o. [phone] here for endometrial biopsy.  The indications for endometrial biopsy were reviewed.  Risks of the biopsy including cramping, bleeding, infection, uterine perforation, inadequate specimen and need for additional procedures were discussed. The patient states she understands and agrees to undergo procedure today. Consent was signed. Time out was performed.   Indications: menorrhagia Urine HCG: negative  A bivalve speculum was placed into the vagina and the cervix was easily visualized and was prepped with Betadine x2. A single-toothed tenaculum was placed on the anterior lip of the cervix to stabilize it. The 3 mm pipelle was introduced into the endometrial cavity without difficulty to a depth of 8.5 cm, and a moderate amount of tissue was obtained and sent to pathology. This was repeated for a total of 3 passes. The instruments were removed from the patient's vagina. Minimal bleeding from the cervix at the tenaculum was noted.   The patient tolerated the procedure well. Routine post-procedure instructions were given to the patient.    Will base further management on results of biopsy.  Lynnda Shields, MD Faculty Attending Center for [facility]

## 2021-01-23 NOTE — Patient Instructions (Signed)
Uterine Fibroids  Uterine fibroids, also called leiomyomas, are noncancerous (benign) tumors that can grow in the uterus. They can cause heavy menstrual bleeding and pain. Fibroids may also grow in the fallopian tubes, cervix, or tissues (ligaments) near the uterus. You may have one or many fibroids. Fibroids vary in size, weight, and where they grow in the uterus. Some can become quite large. Most fibroids do not require medical treatment. What are the causes? The cause of this condition is not known. What increases the risk? You are more likely to develop this condition if you:  Are in your 30s or 40s and have not gone through menopause.  Have a family history of this condition.  Are of African American descent.  Started your menstrual period at age 102 or younger.  Have never given birth.  Are overweight or obese. What are the signs or symptoms? Many women do not have any symptoms. Symptoms of this condition may include:  Heavy menstrual bleeding.  Bleeding between menstrual periods.  Pain and pressure in the pelvic area, between your hip bones.  Pain during sex.  Bladder problems, such as needing to urinate right away or more often than usual.  Inability to have children (infertility).  Failure to carry pregnancy to term (miscarriage). How is this diagnosed? This condition may be diagnosed based on:  Your symptoms and medical history.  A physical exam.  A pelvic exam that includes feeling for any tumors.  Imaging tests, such as ultrasound or MRI. How is this treated? Treatment for this condition may include follow-up visits with your health care provider to monitor your fibroids for any changes. Other treatment may include:  Medicines, such as: ? Medicines to relieve pain, including aspirin and NSAIDs, such as ibuprofen or naproxen. ? Hormone therapy. Treatment may be given as a pill or an injection, or it may be inserted into the uterus using an intrauterine  device (IUD).  Surgery that would do one of the following: ? Remove the fibroids (myomectomy). This may be recommended if fibroids affect your fertility and you want to become pregnant. ? Remove the uterus (hysterectomy). ? Block the blood supply to the fibroids (uterine artery embolization). This can cause them to shrink and die. Follow these instructions at home: Medicines  Take over-the-counter and prescription medicines only as told by your health care provider.  Ask your health care provider if you should take iron pills or eat more iron-rich foods, such as dark green, leafy vegetables. Heavy menstrual bleeding can cause low iron levels. Managing pain If directed, apply heat to your back or abdomen to reduce pain. Use the heat source that your health care provider recommends, such as a moist heat pack or a heating pad. To apply heat:  Place a towel between your skin and the heat source.  Leave the heat on for 20-30 minutes.  Remove the heat if your skin turns bright red. This is especially important if you are unable to feel pain, heat, or cold. You may have a greater risk of getting burned.   General instructions  Pay close attention to your menstrual cycle. Tell your health care provider about any changes, such as: ? Heavier bleeding that requires you to change your pads or tampons more than usual. ? A change in the number of days that your menstrual period lasts. ? A change in symptoms that come with your menstrual period, such as back pain or cramps in your abdomen.  Keep all follow-up visits. This is  important, especially if your fibroids need to be monitored for any changes. Contact a health care provider if you:  Have pelvic pain, back pain, or cramps in your abdomen that do not get better with medicine or heat.  Develop new bleeding between menstrual periods.  Have increased bleeding during or between menstrual periods.  Feel more tired or weak than usual.  Feel  light-headed. Get help right away if you:  Faint.  Have pelvic pain that suddenly gets worse.  Have severe vaginal bleeding that soaks a tampon or pad in 30 minutes or less. Summary  Uterine fibroids are noncancerous (benign) tumors that can develop in the uterus.  The exact cause of this condition is not known.  Most fibroids do not require medical treatment unless they affect your ability to have children (fertility).  Contact a health care provider if you have pelvic pain, back pain, or cramps in your abdomen that do not get better with medicines.  Get help right away if you faint, have pelvic pain that suddenly gets worse, or have severe vaginal bleeding. This information is not intended to replace advice given to you by your health care provider. Make sure you discuss any questions you have with your health care provider. Document Revised: 07/02/2020 Document Reviewed: 07/02/2020 Elsevier Patient Education  [place] A mammogram is an X-ray of the breasts. This is done to check for changes that are not normal. This test can look for changes that may be caused by breast cancer or other problems. Mammograms are regularly done on women beginning at age 31. A man may have a mammogram if he has a lump or swelling in his breast. Tell a doctor:  About any allergies you have.  If you have breast implants.  If you have had breast disease, biopsy, or surgery.  If you have a family history of breast cancer.  If you are breastfeeding.  Whether you are pregnant or may be pregnant. What are the risks? Generally, this is a safe procedure. But problems may occur, including:  Being exposed to radiation. Radiation levels are very low with this test.  The need for more tests.  The results were not read properly.  Trouble finding breast cancer in women with dense breasts. What happens before the test?  Have this test done about 1-2 weeks after your menstrual  period. This is often when your breasts are the least tender.  If you are visiting a new doctor or clinic, have any past mammogram images sent to your new doctor's office.  Wash your breasts and under your arms on the day of the test.  Do not use deodorants, perfumes, lotions, or powders on the day of the test.  Take off any jewelry from your neck.  Wear clothes that you can change into and out of easily. What happens during the test?  You will take off your clothes from the waist up. You will put on a gown.  You will stand in front of the X-ray machine.  Each breast will be placed between two plastic or glass plates. The plates will press down on your breast for a few seconds. Try to relax. This does not cause any harm to your breasts. It may not feel comfortable, but it will be very brief.  X-rays will be taken from different angles of each breast. The procedure may vary among doctors and hospitals.   What can I expect after the test?  The mammogram will be read  by a specialist (radiologist).  You may need to do parts of the test again. This depends on the quality of the images.  You may go back to your normal activities.  It is up to you to get the results of your test. Ask how to get your results when they are ready. Summary  A mammogram is an X-ray of the breasts. It looks for changes that may be caused by breast cancer or other problems.  A man may have this test if he has a lump or swelling in his breast.  Before the test, tell your doctor about any breast problems that you have had in the past.  Have this test done about 1-2 weeks after your menstrual period.  Ask when your test results will be ready. Make sure you get your test results. This information is not intended to replace advice given to you by your health care provider. Make sure you discuss any questions you have with your health care provider. Document Revised: 09/30/2020 Document Reviewed:  09/30/2020 Elsevier Patient Education  2021 [place]. Endometrial Biopsy  An endometrial biopsy is a procedure to remove tissue samples from the endometrium, which is the lining of the uterus. The tissue that is removed can then be checked under a microscope for disease. This procedure is used to diagnose conditions such as endometrial cancer, endometrial tuberculosis, polyps, or other inflammatory conditions. This procedure may also be used to investigate uterine bleeding to determine where you are in your menstrual cycle or how your hormone levels are affecting the lining of the uterus. Tell a health care provider about:  Any allergies you have.  All medicines you are taking, including vitamins, herbs, eye drops, creams, and over-the-counter medicines.  Any problems you or family members have had with anesthetic medicines.  Any blood disorders you have.  Any surgeries you have had.  Any medical conditions you have.  Whether you are pregnant or may be pregnant. What are the risks? Generally, this is a safe procedure. However, problems may occur, including:  Bleeding.  Pelvic infection.  Puncture of the wall of the uterus with the biopsy device (rare).  Allergic reactions to medicines. What happens before the procedure?  Keep a record of your menstrual cycles as told by your health care provider. You may need to schedule your procedure for a specific time in your cycle.  You may want to bring a sanitary pad to wear after the procedure.  Plan to have someone take you home from the hospital or clinic.  Ask your health care provider about: ? Changing or stopping your regular medicines. This is especially important if you are taking diabetes medicines, arthritis medicines, or blood thinners. ? Taking medicines such as aspirin and ibuprofen. These medicines can thin your blood. Do not take these medicines unless your health care provider tells you to take them. ? Taking  over-the-counter medicines, vitamins, herbs, and supplements. What happens during the procedure?  You will lie on an exam table with your feet and legs supported as in a pelvic exam.  Your health care provider will insert an instrument (speculum) into your vagina to see your cervix.  Your cervix will be cleansed with an antiseptic solution.  A medicine (local anesthetic) will be used to numb the cervix.  A forceps instrument (tenaculum) will be used to hold your cervix steady for the biopsy.  A thin, rod-like instrument (uterine sound) will be inserted through your cervix to determine the length of your  uterus and the location where the biopsy sample will be removed.  A thin, flexible tube (catheter) will be inserted through your cervix and into the uterus. The catheter will be used to collect the biopsy sample from your endometrial tissue.  The catheter and speculum will then be removed, and the tissue sample will be sent to a lab for examination. The procedure may vary among health care providers and hospitals. What can I expect after procedure?  You will rest in a recovery area until you are ready to go home.  You may have mild cramping and a small amount of vaginal bleeding. This is normal.  You may have a small amount of vaginal bleeding for a few days. This is normal.  It is up to you to get the results of your procedure. Ask your health care provider, or the department that is doing the procedure, when your results will be ready. Follow these instructions at home:  Take over-the-counter and prescription medicines only as told by your health care provider.  Do not douche, use tampons, or have sexual intercourse until your health care provider approves.  Return to your normal activities as told by your health care provider. Ask your health care provider what activities are safe for you.  Follow instructions from your health care provider about any activity restrictions, such  as restrictions on strenuous exercise or heavy lifting.  Keep all follow-up visits. This is important. Contact a health care provider:  You have heavy bleeding, or bleed for longer than 2 days after the procedure.  You have bad smelling discharge from your vagina.  You have a fever or chills.  You have a burning sensation when urinating or you have difficulty urinating.  You have severe pain in your lower abdomen. Get help right away if you:  You have severe cramps in your stomach or back.  You pass large blood clots.  Your bleeding increases.  You become weak or light-headed, or you faint or lose consciousness. Summary  An endometrial biopsy is a procedure to remove tissue samples is taken from the endometrium, which is the lining of the uterus.  The tissue sample that is removed will be checked under a microscope for disease.  This procedure is used to diagnose conditions such as endometrial cancer, endometrial tuberculosis, polyps, or other inflammatory conditions.  After the procedure, it is common to have mild cramping and a small amount of vaginal bleeding for a few days.  Do not douche, use tampons, or have sexual intercourse until your health care provider approves. Ask your health care provider which activities are safe for you. This information is not intended to replace advice given to you by your health care provider. Make sure you discuss any questions you have with your health care provider. Document Revised: 06/24/2020 Document Reviewed: 06/24/2020 Elsevier Patient Education  [place] POST-PROCEDURE INSTRUCTIONS  1. You may take Ibuprofen, Aleve or Tylenol for pain if needed.  Cramping should resolve within in 24 hours.  2. You may have a small amount of spotting.  You should wear a mini pad for the next few days.  3. You may have intercourse after 24 hours.  4. You need to call if you have any pelvic pain, fever, heavy  bleeding or foul smelling vaginal discharge.  5. Shower or bathe as normal  6. We will call you within one week with results or we will discuss   the results at your follow-up appointment if  needed.

## 2021-01-23 NOTE — Progress Notes (Signed)
GYNECOLOGY ANNUAL PREVENTATIVE CARE ENCOUNTER NOTE  History:     Jasmin Stephenson is a 50 y.o. G60P3003 female here for a routine annual gynecologic exam.  Current complaints: menorrhagia.   Denies discharge, pelvic pain, problems with intercourse or other gynecologic concerns.    Pt notes 10 year hx of heavy regular menses usually lasting 7 days.  Menses are heaviest on days 1-3.  She notes cramping and clots with menses.  She does feel fatigued and dizzy occasionally.  Recent hemoglobin was 7.2 and she states she recently receive 1 unit of PRBC   Gynecologic History Patient's last menstrual period was 01/06/2021. Contraception: tubal ligation Last Pap: 04/2013. Results were: normal with negative HPV Last mammogram: 04/2013. Results were: normal bi-rads 2  Obstetric History OB History  Gravida Para Term Preterm AB Living  3 3 3     3   SAB IAB Ectopic Multiple Live Births               # Outcome Date GA Lbr Len/2nd Weight Sex Delivery Anes PTL Lv  3 Term      Vag-Spont     2 Term      Vag-Spont     1 Term      Vag-Spont       Past Medical History:  Diagnosis Date  . Allergy   . Anemia    1 unit  . Anxiety   . Arthritis    and tendonitis  . Asthma   . Breast calcification, right   . Cervical herniated disc 2017  . Depression   . Dysmenorrhea 11/2020  . Family history of adverse reaction to anesthesia   . GERD (gastroesophageal reflux disease)   . Menorrhagia 11/2020  . Myocardial infarction [facility]) yrs ago   " Mild" no cardiologist  . Neck fracture ([place])    cervical neck fracture  . Vitamin D deficiency     Past Surgical History:  Procedure Laterality Date  . ABDOMINAL SURGERY    . APPENDECTOMY  yrs ago  . BLADDER SURGERY    . CERVICAL DISC ARTHROPLASTY N/A 09/21/2016   Procedure: CERVICAL SIX- CERVICAL SEVEN ARTHROPLASTY;  Surgeon: Kevan Ny Ditty, MD;  Location: [facility];  Service: Neurosurgery;  Laterality: N/A;  C6-7 Arthroplasty  . CHOLECYSTECTOMY N/A  10/10/2019   Procedure: LAPAROSCOPIC CHOLECYSTECTOMY WITH INTRAOPERATIVE CHOLANGIOGRAM;  Surgeon: Donnie Mesa, MD;  Location: [facility];  Service: General;  Laterality: N/A;  . FOOT SURGERY Left    corrections on foot  . IRRIGATION AND DEBRIDEMENT ABSCESS Right 09/10/2014   Procedure: IRRIGATION AND DEBRIDEMENT RIGHT BREAST  ABSCESS;  Surgeon: Gayland Curry, MD;  Location: [facility];  Service: General;  Laterality: Right;  . SYNDACTYLIZATION Right 01/08/2021   Procedure: SYNDACTYLIZATION;  Surgeon: Evelina Bucy, DPM;  Location: [facility];  Service: Podiatry;  Laterality: Right;  . WISDOM TOOTH EXTRACTION      Current Outpatient Medications on File Prior to Visit  Medication Sig Dispense Refill  . acetaminophen (TYLENOL) 500 MG tablet Take 500 mg by mouth every 6 (six) hours as needed.    Marland Kitchen albuterol (PROVENTIL) (2.5 MG/3ML) 0.083% nebulizer solution Take 3 mLs (2.5 mg total) by nebulization every 6 (six) hours as needed for wheezing or shortness of breath. 75 mL 12  . albuterol (VENTOLIN HFA) 108 (90 Base) MCG/ACT inhaler Inhale 2 puffs into the lungs every 4 (four) hours as needed for wheezing or shortness of breath (cough, shortness of breath or  wheezing.). 1 each 11  . aspirin EC 81 MG tablet Take 1 tablet (81 mg total) by mouth daily. 150 tablet 2  . CALCIUM ASCORBATE PO Take 10 mg by mouth daily.    . clindamycin (CLEOCIN) 150 MG capsule Take 1 capsule (150 mg total) by mouth 2 (two) times daily. 14 capsule 0  . clobetasol ointment (TEMOVATE) 0.05 % Apply to the affected areas of the scalp 3-4 times weekly    . diclofenac Sodium (VOLTAREN) 1 % GEL Apply 1 application topically daily.    . diphenhydrAMINE (BENADRYL) 50 MG tablet Take 1 tablet (50 mg total) by mouth at bedtime as needed for itching. 30 tablet 2  . ferrous sulfate 324 (65 Fe) MG TBEC TAKE 1 TABLET (324 MG TOTAL) BY MOUTH IN THE MORNING AND AT BEDTIME. 180 tablet 3  . ondansetron (ZOFRAN) 4 MG tablet Take 1  tablet (4 mg total) by mouth every 8 (eight) hours as needed for nausea or vomiting. 20 tablet 0  . oxyCODONE-acetaminophen (PERCOCET) 5-325 MG tablet Take 1 tablet by mouth every 4 (four) hours as needed for severe pain. 20 tablet 0  . rosuvastatin (CRESTOR) 10 MG tablet Take 1 tablet (10 mg total) by mouth daily. 30 tablet 10  . triamcinolone (KENALOG) 0.1 % Apply 1 application topically 2 (two) times daily. 30 g 1  . Vitamin D, Ergocalciferol, (DRISDOL) 1.25 MG (50000 UNIT) CAPS capsule Take 1 capsule (50,000 Units total) by mouth every 7 (seven) days. 5 capsule 6  . cilostazol (PLETAL) 100 MG tablet Take 1 tablet (100 mg total) by mouth 2 (two) times daily before a meal. (Patient not taking: No sig reported) 60 tablet 11  . Fluocinolone Acetonide Body 0.01 % OIL Apply to scalp 2-3 times per week for itch. (Patient not taking: Reported on 01/23/2021)    . fluticasone (FLONASE) 50 MCG/ACT nasal spray Place 2 sprays into both nostrils daily. (Patient not taking: Reported on 01/23/2021) 16 g 6   No current facility-administered medications on file prior to visit.    Allergies  Allergen Reactions  . Penicillins Nausea And Vomiting and Rash    Has patient had a PCN reaction causing immediate rash, facial/tongue/throat swelling, SOB or lightheadedness with hypotension: Yes Has patient had a PCN reaction causing severe rash involving mucus membranes or skin necrosis: Yes Has patient had a PCN reaction that required hospitalization No Has patient had a PCN reaction occurring within the last 10 years: No If all of the above answers are "NO", then may proceed with Cephalosporin use.    Social History:  reports that she has been smoking cigarettes. She has a 15.00 pack-year smoking history. She has never used smokeless tobacco. She reports current alcohol use. She reports previous drug use. Drug: Marijuana.  Family History  Problem Relation Age of Onset  . Diabetes Father   . Breast cancer Sister 59   . Alcohol abuse Neg Hx   . Arthritis Neg Hx   . Asthma Neg Hx   . Birth defects Neg Hx   . Cancer Neg Hx   . COPD Neg Hx   . Depression Neg Hx   . Drug abuse Neg Hx   . Early death Neg Hx   . Hearing loss Neg Hx   . Heart disease Neg Hx   . Hyperlipidemia Neg Hx   . Hypertension Neg Hx   . Kidney disease Neg Hx   . Learning disabilities Neg Hx   . Mental illness Neg  Hx   . Mental retardation Neg Hx   . Miscarriages / Stillbirths Neg Hx   . Stroke Neg Hx   . Vision loss Neg Hx     The following portions of the patient's history were reviewed and updated as appropriate: allergies, current medications, past family history, past medical history, past social history, past surgical history and problem list.  Review of Systems Pertinent items noted in HPI and remainder of comprehensive ROS otherwise negative.  Physical Exam:  BP (!) 137/92   Pulse 76   Ht 5\' 5"  (1.651 m)   Wt 196 lb 11.2 oz (89.2 kg)   LMP 01/06/2021   BMI 32.73 kg/m  CONSTITUTIONAL: Well-developed, well-nourished female in no acute distress.  HENT:  Normocephalic, atraumatic, External right and left ear normal. Oropharynx is clear and moist EYES: Conjunctivae and EOM are normal.  NECK: Normal range of motion, supple, no masses.  Normal thyroid.  SKIN: Skin is warm and dry. No rash noted. Not diaphoretic. No erythema. No pallor. MUSCULOSKELETAL: Normal range of motion. No tenderness.  No cyanosis, clubbing, or edema.  2+ distal pulses. NEUROLOGIC: Alert and oriented to person, place, and time. Normal reflexes, muscle tone coordination.  PSYCHIATRIC: Normal mood and affect. Normal behavior. Normal judgment and thought content. CARDIOVASCULAR: Normal heart rate noted, regular rhythm RESPIRATORY: Clear to auscultation bilaterally. Effort and breath sounds normal, no problems with respiration noted. BREASTS: Symmetric in size. No masses, tenderness, skin changes, nipple drainage, or lymphadenopathy bilaterally.  Performed in the presence of a chaperone. ABDOMEN: Soft, no distention noted.  No tenderness, rebound or guarding.  PELVIC: Normal appearing external genitalia and urethral meatus; normal appearing vaginal mucosa and cervix.  No abnormal discharge noted.  Pap smear obtained. GC/C obtained.  Normal uterine size, no other palpable masses, no uterine or adnexal tenderness.  Performed in the presence of a chaperone.   Assessment and Plan:    1. Menorrhagia with regular cycle Will check cbc to eval anemia status, she recently was transfused 1 unit of blood Endometrial biopsy taken, see separate note Schedule pelvic US to eval uterus for fibroids or anomalies.  Treatment tiering:  Lysteda                               Progesterone IUD/ ablation                               Hysterectomy Pt not candidate for OCP due to persistent tobacco use  - POCT urine pregnancy - Surgical pathology( [place]/ [facility]) - US PELVIC COMPLETE WITH TRANSVAGINAL; Future - CBC  2. Women's annual routine gynecological examination Repeat in 1 year. - Cytology - PAP( [place]) - MM Digital Screening; Future - Cervicovaginal ancillary only( [place])  3. History of MI (myocardial infarction) Due to history of MI 18 years ago will check with cards to see if pt is candidate for lysteda (TXA) therapy.  If surgery is pursued, she will likely need cardiac clearance anyway - Ambulatory referral to Cardiology  Will follow up results of pap smear and manage accordingly. Mammogram scheduled Routine preventative health maintenance measures emphasized. Please refer to After Visit Summary for other counseling recommendations.      Lynnda Shields, MD, [facility], [place]

## 2021-01-23 NOTE — Progress Notes (Signed)
New Patient in the office, reports that her cycles have been extremely heavy in the last 10 years. Pt states that cycle lasts for 7 days and she has to wear tampons and pads, changing every 15 minutes.  LMP 01/06/21 Last pap 11/17/17 Last mammogram 2019

## 2021-01-24 LAB — CERVICOVAGINAL ANCILLARY ONLY
Chlamydia: NEGATIVE
Comment: NEGATIVE
Comment: NORMAL
Neisseria Gonorrhea: NEGATIVE

## 2021-01-24 LAB — CBC
Hematocrit: 35.9 % (ref 34.0–46.6)
Hemoglobin: 10.2 g/dL — ABNORMAL LOW (ref 11.1–15.9)
MCH: 20.9 pg — ABNORMAL LOW (ref 26.6–33.0)
MCHC: 28.4 g/dL — ABNORMAL LOW (ref 31.5–35.7)
MCV: 73 fL — ABNORMAL LOW (ref 79–97)
Platelets: 505 10*3/uL — ABNORMAL HIGH (ref 150–450)
RBC: 4.89 x10E6/uL (ref 3.77–5.28)
RDW: 24.7 % — ABNORMAL HIGH (ref 11.7–15.4)
WBC: 10.9 10*3/uL — ABNORMAL HIGH (ref 3.4–10.8)

## 2021-01-27 LAB — SURGICAL PATHOLOGY

## 2021-01-28 ENCOUNTER — Encounter: Payer: Medicaid Other | Admitting: Podiatry

## 2021-01-28 LAB — CYTOLOGY - PAP
Comment: NEGATIVE
Diagnosis: NEGATIVE
High risk HPV: NEGATIVE

## 2021-01-31 ENCOUNTER — Ambulatory Visit (INDEPENDENT_AMBULATORY_CARE_PROVIDER_SITE_OTHER): Payer: Medicaid Other | Admitting: Podiatry

## 2021-01-31 DIAGNOSIS — Z5329 Procedure and treatment not carried out because of patient's decision for other reasons: Secondary | ICD-10-CM

## 2021-01-31 NOTE — Progress Notes (Signed)
No show for post-op appointment.

## 2021-02-04 ENCOUNTER — Encounter: Payer: Medicaid Other | Admitting: Podiatry

## 2021-02-05 ENCOUNTER — Telehealth: Payer: Self-pay | Admitting: Podiatry

## 2021-02-05 NOTE — Telephone Encounter (Signed)
Jasmin Stephenson from [facility] called to ask if you would please complete all the Final Operative Reports, that are in your inbasket. Hilda Blades said she thinks there are about 5. She said that the would need to be closed to submit payment. Thank you

## 2021-02-05 NOTE — Telephone Encounter (Signed)
Dr. March Rummage, please complete these ASAP

## 2021-02-10 ENCOUNTER — Ambulatory Visit: Admission: RE | Admit: 2021-02-10 | Payer: Medicaid Other | Source: Ambulatory Visit

## 2021-02-10 NOTE — Op Note (Signed)
  Patient Name: Jasmin Stephenson DOB: 1971-03-19  MRN: [id number]   Date of Surgery: 01/08/2021  Surgeon: Dr. Hardie Pulley, DPM Assistants: none  Pre-operative Diagnosis:  * No Diagnosis Codes entered * Post-operative Diagnosis:  * No Diagnosis Codes entered * Procedures:  1) Syndactylization 4th/5th toes right foot Pathology/Specimens: * No specimens in log * Anesthesia: MAC/local Hemostasis:  Total Tourniquet Time Documented: Calf (Right) - 15 minutes Total: Calf (Right) - 15 minutes  Estimated Blood Loss: 2 ml Materials: * No implants in log * Medications: none Complications: none  Indications for Procedure:  This is a 50 y.o. 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  She had healed fully suffered a laceration and the toes were separated.  She presents today for elective correction   Procedure in Detail: Patient was identified in pre-operative holding area. Formal consent was signed and the right lower extremity was marked. Patient was brought back to the operating room. Anesthesia was induced. The extremity was prepped and draped in the usual sterile fashion. Timeout was taken to confirm patient name, laterality, and procedure prior to incision.   Attention was then directed to the right fourth and fifth toes.  Despite previous syndactylization the toes were noted to be completely separated and skin had healed.  An incision was drawn at the medial aspect of the fifth toe for excision and syndactylization.  The toes were pressed together so that the skin were transferred to the other digit.  The skin margins were then fully excised.  The skin was then repaired with 4-0 Monocryl suture to syndactylize the toes together.  There was good correction of of the deformity noted and good apposition of skin edges.  The foot was then dressed with Xeroform 4 x 4 Kerlix and Ace bandage. Patient tolerated the procedure  well  Disposition: Following a period of post-operative monitoring, patient will be transferred home.

## 2021-02-11 ENCOUNTER — Other Ambulatory Visit: Payer: Self-pay

## 2021-02-11 ENCOUNTER — Ambulatory Visit (INDEPENDENT_AMBULATORY_CARE_PROVIDER_SITE_OTHER): Payer: Medicaid Other | Admitting: Podiatry

## 2021-02-11 ENCOUNTER — Encounter: Payer: Self-pay | Admitting: General Practice

## 2021-02-11 DIAGNOSIS — B351 Tinea unguium: Secondary | ICD-10-CM

## 2021-02-11 DIAGNOSIS — M2041 Other hammer toe(s) (acquired), right foot: Secondary | ICD-10-CM

## 2021-02-11 MED ORDER — CICLOPIROX 8 % EX SOLN: Freq: Every day | CUTANEOUS | 0 refills | Status: DC

## 2021-02-11 NOTE — Progress Notes (Signed)
  Subjective:  Patient ID: Jasmin Stephenson, female    DOB: [birth year],  MRN: [id number]  Chief Complaint  Patient presents with  . Routine Post Op    POV #3 DOS 01/08/2021 RT FOOT REPAIR OF SYNDACTYLIZATION    DOS: 01/08/21 Procedure: Repair syndactylization right 4th/5th toes  50 y.o. female presents with the above complaint. History confirmed with patient.   Objective:  Physical Exam: no tenderness at the surgical site, local edema noted and calf supple, nontender. Incision: healing well, no significant drainage, no dehiscence, no significant erythema  Assessment:   1. Hammer toe of right foot     Plan:  Patient was evaluated and treated and all questions answered.  Post-operative State -Doing well, appears to be healing. Continue buddy taping to promote healing. Check in 1 month to ensure healing.  Onychomycosis, distal -Rx Penlac. Educated on risks, benefits of medication  No follow-ups on file.

## 2021-02-17 DIAGNOSIS — L669 Cicatricial alopecia, unspecified: Secondary | ICD-10-CM | POA: Diagnosis not present

## 2021-02-17 DIAGNOSIS — L089 Local infection of the skin and subcutaneous tissue, unspecified: Secondary | ICD-10-CM | POA: Diagnosis not present

## 2021-02-17 DIAGNOSIS — L658 Other specified nonscarring hair loss: Secondary | ICD-10-CM | POA: Diagnosis not present

## 2021-02-17 DIAGNOSIS — Z79899 Other long term (current) drug therapy: Secondary | ICD-10-CM | POA: Diagnosis not present

## 2021-02-17 DIAGNOSIS — L299 Pruritus, unspecified: Secondary | ICD-10-CM | POA: Diagnosis not present

## 2021-02-18 ENCOUNTER — Ambulatory Visit
Admission: RE | Admit: 2021-02-18 | Discharge: 2021-02-18 | Disposition: A | Discharge status: Home / Self Care | Payer: Medicaid Other | Source: Ambulatory Visit | Attending: Obstetrics and Gynecology | Admitting: Obstetrics and Gynecology

## 2021-02-18 ENCOUNTER — Other Ambulatory Visit: Payer: Self-pay

## 2021-02-18 DIAGNOSIS — L299 Pruritus, unspecified: Secondary | ICD-10-CM | POA: Insufficient documentation

## 2021-02-18 DIAGNOSIS — D259 Leiomyoma of uterus, unspecified: Secondary | ICD-10-CM | POA: Diagnosis not present

## 2021-02-18 DIAGNOSIS — N920 Excessive and frequent menstruation with regular cycle: Secondary | ICD-10-CM | POA: Insufficient documentation

## 2021-02-18 DIAGNOSIS — N939 Abnormal uterine and vaginal bleeding, unspecified: Secondary | ICD-10-CM | POA: Diagnosis not present

## 2021-02-19 ENCOUNTER — Telehealth (INDEPENDENT_AMBULATORY_CARE_PROVIDER_SITE_OTHER): Payer: Medicaid Other | Admitting: Obstetrics and Gynecology

## 2021-02-19 ENCOUNTER — Telehealth: Payer: Self-pay

## 2021-02-19 DIAGNOSIS — Z91199 Patient's noncompliance with other medical treatment and regimen due to unspecified reason: Secondary | ICD-10-CM | POA: Insufficient documentation

## 2021-02-19 DIAGNOSIS — Z5329 Procedure and treatment not carried out because of patient's decision for other reasons: Secondary | ICD-10-CM | POA: Insufficient documentation

## 2021-02-19 NOTE — Telephone Encounter (Signed)
Pt left voicemail says she missed a call from "Dr. Elgie Congo" She thinks it's regarding the ultrasound she had yesterday.  Unable to reach pt; vm not set up.

## 2021-02-19 NOTE — Progress Notes (Signed)
Pt no show for virtual follow up, called multiple times and office unable to leave message

## 2021-02-19 NOTE — Progress Notes (Addendum)
I tried calling patient multiple times to triage her for 10:30am virtual visit. Calls were made every 30 mins beginning at 8:30. Pt did not answer and does not have a VM that is set up. She did call the office around 10:00am but was unable to triage her due to patient being in work up area. Pt then called the triage line and left message. I tried calling her right back but again no answer. Patient will need to reschedule appointment. [place] Environmental manager notified.   Patient was assessed and managed by nursing staff during this encounter. I have reviewed the chart and agree with the documentation and plan. I have also made any necessary editorial changes.  Griffin Basil, MD 02/19/2021 12:25 PM

## 2021-02-25 ENCOUNTER — Ambulatory Visit (INDEPENDENT_AMBULATORY_CARE_PROVIDER_SITE_OTHER): Payer: Medicaid Other | Admitting: Obstetrics and Gynecology

## 2021-02-25 ENCOUNTER — Other Ambulatory Visit: Payer: Self-pay

## 2021-02-25 VITALS — BP 127/84 | HR 80 | Wt 191.0 lb

## 2021-02-25 DIAGNOSIS — D252 Subserosal leiomyoma of uterus: Secondary | ICD-10-CM

## 2021-02-25 DIAGNOSIS — D251 Intramural leiomyoma of uterus: Secondary | ICD-10-CM | POA: Diagnosis not present

## 2021-02-25 DIAGNOSIS — N920 Excessive and frequent menstruation with regular cycle: Secondary | ICD-10-CM

## 2021-02-25 DIAGNOSIS — D259 Leiomyoma of uterus, unspecified: Secondary | ICD-10-CM | POA: Insufficient documentation

## 2021-02-25 NOTE — Progress Notes (Signed)
  CC: fibroid follow up Subjective:    Patient ID: Jasmin Stephenson, female    DOB: 12-31-1970, 50 y.o.   MRN: [id number]  HPI Pt seen, long discussion with review of ultrasound findings and treatment modalities.  Discussed IUD and ablation.  This would address menorrhagia, but not the fibroid.  Also discussed uterine artery embolization, Sonata procedure and the Acessa procedure to address bleeding and the fibroid.  Pt is adamant that she does not want a hysterectomy at this time.  After much discussion, pt felt most comfortable with [name].   Review of Systems     Objective:   Physical Exam Vitals:   02/25/21 1306  BP: 127/84  Pulse: 80   CLINICAL DATA:  Menorrhagia.   EXAM: TRANSABDOMINAL AND TRANSVAGINAL ULTRASOUND OF PELVIS   TECHNIQUE: Both transabdominal and transvaginal ultrasound examinations of the pelvis were performed. Transabdominal technique was performed for global imaging of the pelvis including uterus, ovaries, adnexal regions, and pelvic cul-de-sac. It was necessary to proceed with endovaginal exam following the transabdominal exam to visualize the uterus and ovaries.   COMPARISON:  CT 11/22/2006.   FINDINGS: Uterus   Measurements: 13.6 x 6.7 x 8.0 cm = volume: 381.4 mL. 6.2 x 5.1 x 5.1 cm fibroid.   Endometrium   Thickness: 7.9 mm. Endometrium is slightly heterogeneous. Tiny amount of endometrial canal fluid noted.   Right ovary   Measurements: 2.4 x 1.4 x 2.0 cm = volume: 3.5 mL. Normal appearance/no adnexal mass.   Left ovary   Measurements: 2.5 x 1.6 x 1.1 cm = volume: 2.3 mL. Normal appearance/no adnexal mass.   Color flow to both ovaries noted.   Other findings   No abnormal free fluid.   IMPRESSION: 1. Endometrium is slightly heterogeneous. Tiny amount of endometrial canal fluid noted. If bleeding remains unresponsive to hormonal or medical therapy, sonohysterogram should be considered for focal lesion work-up. (Ref: Radiological  Reasoning: Algorithmic Workup of Abnormal Vaginal Bleeding with Endovaginal Sonography and Sonohysterography. AJR 2008; 212:Y48-25) 2. 6.2 x 5.1 x 5.1 cm fibroid.       Assessment & Plan:   1. Menorrhagia with regular cycle Pt will be referred to interventional radiology for eval for potential embolization of fibroid.  If she is not a candidate, then will reconsider Sonata or Dolores - Ambulatory referral to Interventional Radiology  2. Intramural and subserous leiomyoma of uterus  - Ambulatory referral to Interventional Radiology  I spent 20 minutes dedicated to the care of this patient including previsit review of records, face to face time with the patient discussing treatment options and post visit testing.    Jasmin Basil, MD Faculty Attending, Center for [facility]

## 2021-02-25 NOTE — Patient Instructions (Signed)
Uterine Artery Embolization for Fibroids  Uterine artery embolization is a procedure to shrink uterine fibroids. Uterine fibroids are masses of tissue (tumors) that can develop in the womb (uterus). They are also called leiomyomas. This type of tumor is not cancerous (benign) and does not spread to other parts of the body outside of the pelvic area. The pelvic area is the part of the body between the hip bones. You can have one or many fibroids. Fibroids can vary in size, shape, weight, and where they grow in the uterus. Some can become quite large. In this procedure, a thin plastic tube (catheter) is used to inject a chemical that blocks off the blood supply to the fibroid, which causes the fibroid to shrink. Tell a health care provider about:  Any allergies you have.  All medicines you are taking, including vitamins, herbs, eye drops, creams, and over-the-counter medicines.  Any problems you or family members have had with anesthetic medicines.  Any blood disorders you have.  Any surgeries you have had.  Any medical conditions you have.  Whether you are pregnant or may be pregnant. What are the risks? Generally, this is a safe procedure. However, problems may occur, including:  Bleeding.  Allergic reactions to medicines or dyes.  Damage to other structures or organs.  Infection, including blood infection (septicemia).  Injury to the uterus from decreased blood supply.  Lack of menstrual periods (amenorrhea).  Death of tissue cells (necrosis) around your bladder or vulva.  Development of a hole between organs or from an organ to the surface of your skin (fistula).  Blood clot in the legs (deep vein thrombosis) or lung (pulmonary embolus).  Nausea and vomiting. What happens before the procedure? Staying hydrated Follow instructions from your health care provider about hydration, which may include:  Up to 2 hours before the procedure - you may continue to drink clear  liquids, such as water, clear fruit juice, black coffee, and plain tea. Eating and drinking restrictions Follow instructions from your health care provider about eating and drinking, which may include:  8 hours before the procedure - stop eating heavy meals or foods such as meat, fried foods, or fatty foods.  6 hours before the procedure - stop eating light meals or foods, such as toast or cereal.  6 hours before the procedure - stop drinking milk or drinks that contain milk.  2 hours before the procedure - stop drinking clear liquids. Medicines  Ask your health care provider about: ? Changing or stopping your regular medicines. This is especially important if you are taking diabetes medicines or blood thinners. ? Taking over-the-counter medicines, vitamins, herbs, and supplements. ? Taking medicines such as aspirin and ibuprofen. These medicines can thin your blood. Do not take these medicines unless your health care provider tells you to take them.  You may be given antibiotic medicine to help prevent infection.  You may be given medicine to prevent nausea and vomiting (antiemetic). General instructions  Ask your health care provider how your surgical site will be marked or identified.  You may be asked to shower with a germ-killing soap.  Plan to have someone take you home from the hospital or clinic.  If you will be going home right after the procedure, plan to have someone with you for 24 hours.  You will be asked to empty your bladder. What happens during the procedure?  To lower your risk of infection: ? Your health care team will wash or sanitize their hands. ?   Hair may be removed from the surgical area. ? Your skin will be washed with soap.  An IV will be inserted into one of your veins.  You will be given one or more of the following: ? A medicine to help you relax (sedative). ? A medicine to numb the area (local anesthetic).  A small cut (incision) will be made  in your groin.  A catheter will be inserted into the main artery of your leg. The catheter will be guided through the artery to your uterus.  A series of images will be taken while dye is injected through the catheter in your groin. X-rays are taken at the same time. This is done to provide a road map of the blood supply to your uterus and fibroids.  Tiny plastic spheres, about the size of sand grains, will be injected through the catheter. Metal coils may be used to help block the artery. The particles will lodge in tiny branches of the uterine artery that supplies blood to the fibroids.  The procedure will be repeated on the artery that supplies the other side of the uterus.  The catheter will be removed and pressure will be applied to stop the bleeding.  A dressing will be placed over the incision. The procedure may vary among health care providers and hospitals. What happens after the procedure?  Your blood pressure, heart rate, breathing rate, and blood oxygen level will be monitored until the medicines you were given have worn off.  You will be given pain medicine as needed.  You may be given medicine for nausea and vomiting as needed.  Do not drive for 24 hours if you were given a sedative. Summary  Uterine artery embolization is a procedure to shrink uterine fibroids by blocking the blood supply to the fibroid.  You may be given a sedative and local anesthetic for the procedure.  A catheter will be inserted into the main artery of your leg. The catheter will be guided through the artery to your uterus.  After the procedure you will be given pain medicine and medicine for nausea as needed.  Do not drive for 24 hours if you were given a sedative. This information is not intended to replace advice given to you by your health care provider. Make sure you discuss any questions you have with your health care provider. Document Revised: 11/12/2017 Document Reviewed:  03/04/2017 Elsevier Patient Education  2021 [employer].

## 2021-03-10 NOTE — Progress Notes (Signed)
Referring-Jasmin Clovis Riley, FNP Reason for referral-preoperative evaluation prior to fibroid surgery  HPI: 50 year old female for preoperative evaluation prior to fibroid surgery at request of Belva Crome, FNP.  ABIs June 2021 showed moderate right lower extremity arterial disease and normal left.  Dopplers suggested possible left peroneal disease and inflow stenosis in the mid right external iliac artery greater than 50%.  Vascular disease followed by vascular surgery.  Patient states that she was told she may be having a myocardial infarction in the emergency room in the past.  However she saw her cardiologist in follow-up and was told that her heart was strong.  She has dyspnea with more vigorous activities but no orthopnea, PND, pedal edema.  She denies exertional chest pain.  No syncope.  She can ambulate 1 mile without having chest pain.  She can climb 3 flights of stairs without chest pain though has some dyspnea.  Cardiology asked to evaluate.  Current Outpatient Medications  Medication Sig Dispense Refill  . acetaminophen (TYLENOL) 500 MG tablet Take 500 mg by mouth every 6 (six) hours as needed.    Marland Kitchen albuterol (PROVENTIL) (2.5 MG/3ML) 0.083% nebulizer solution Take 3 mLs (2.5 mg total) by nebulization every 6 (six) hours as needed for wheezing or shortness of breath. 75 mL 12  . albuterol (VENTOLIN HFA) 108 (90 Base) MCG/ACT inhaler Inhale 2 puffs into the lungs every 4 (four) hours as needed for wheezing or shortness of breath (cough, shortness of breath or wheezing.). 1 each 11  . aspirin EC 81 MG tablet Take 1 tablet (81 mg total) by mouth daily. 150 tablet 2  . CALCIUM ASCORBATE PO Take 10 mg by mouth daily.    . ciclopirox (PENLAC) 8 % solution Apply topically at bedtime. Apply over nail and surrounding skin. Apply daily over previous coat. Remove weekly with file or polish remover. 6.6 mL 0  . cilostazol (PLETAL) 100 MG tablet Take 1 tablet (100 mg total) by mouth 2 (two) times  daily before a meal. 60 tablet 11  . clobetasol ointment (TEMOVATE) 0.05 % Apply to the affected areas of the scalp 3-4 times weekly    . diphenhydrAMINE (BENADRYL) 50 MG tablet Take 1 tablet (50 mg total) by mouth at bedtime as needed for itching. 30 tablet 2  . ferrous sulfate 324 (65 Fe) MG TBEC TAKE 1 TABLET (324 MG TOTAL) BY MOUTH IN THE MORNING AND AT BEDTIME. 180 tablet 3  . Fluocinolone Acetonide Body 0.01 % OIL     . fluticasone (FLONASE) 50 MCG/ACT nasal spray Place 2 sprays into both nostrils daily. 16 g 6  . ondansetron (ZOFRAN) 4 MG tablet Take 1 tablet (4 mg total) by mouth every 8 (eight) hours as needed for nausea or vomiting. 20 tablet 0  . rosuvastatin (CRESTOR) 10 MG tablet Take 1 tablet (10 mg total) by mouth daily. 30 tablet 10  . triamcinolone (KENALOG) 0.1 % Apply 1 application topically 2 (two) times daily. 30 g 1  . Vitamin D, Ergocalciferol, (DRISDOL) 1.25 MG (50000 UNIT) CAPS capsule Take 1 capsule (50,000 Units total) by mouth every 7 (seven) days. 5 capsule 6   No current facility-administered medications for this visit.    Allergies  Allergen Reactions  . Penicillins Nausea And Vomiting and Rash    Has patient had a PCN reaction causing immediate rash, facial/tongue/throat swelling, SOB or lightheadedness with hypotension: Yes Has patient had a PCN reaction causing severe rash involving mucus membranes or skin necrosis: Yes Has  patient had a PCN reaction that required hospitalization No Has patient had a PCN reaction occurring within the last 10 years: No If all of the above answers are "NO", then may proceed with Cephalosporin use.     Past Medical History:  Diagnosis Date  . Allergy   . Anemia    1 unit  . Anxiety   . Arthritis    and tendonitis  . Asthma   . Breast calcification, right   . Cervical herniated disc 2017  . Depression   . Dysmenorrhea 11/2020  . Family history of adverse reaction to anesthesia   . GERD (gastroesophageal reflux  disease)   . Hyperlipidemia   . Menorrhagia 11/2020  . Myocardial infarction [facility]) yrs ago   " Mild" no cardiologist  . Neck fracture ([place])    cervical neck fracture  . Vitamin D deficiency     Past Surgical History:  Procedure Laterality Date  . ABDOMINAL SURGERY    . APPENDECTOMY  yrs ago  . BLADDER SURGERY    . CERVICAL DISC ARTHROPLASTY N/A 09/21/2016   Procedure: CERVICAL SIX- CERVICAL SEVEN ARTHROPLASTY;  Surgeon: Kevan Ny Ditty, MD;  Location: [facility];  Service: Neurosurgery;  Laterality: N/A;  C6-7 Arthroplasty  . CHOLECYSTECTOMY N/A 10/10/2019   Procedure: LAPAROSCOPIC CHOLECYSTECTOMY WITH INTRAOPERATIVE CHOLANGIOGRAM;  Surgeon: Donnie Mesa, MD;  Location: [place];  Service: General;  Laterality: N/A;  . FOOT SURGERY Left    corrections on foot  . IRRIGATION AND DEBRIDEMENT ABSCESS Right 09/10/2014   Procedure: IRRIGATION AND DEBRIDEMENT RIGHT BREAST  ABSCESS;  Surgeon: Gayland Curry, MD;  Location: [facility];  Service: General;  Laterality: Right;  . SYNDACTYLIZATION Right 01/08/2021   Procedure: SYNDACTYLIZATION;  Surgeon: Evelina Bucy, DPM;  Location[facility];  Service: Podiatry;  Laterality: Right;  . TUBAL LIGATION     per pt   . WISDOM TOOTH EXTRACTION      Social History   Socioeconomic History  . Marital status: Single    Spouse name: Not on file  . Number of children: 3  . Years of education: Not on file  . Highest education level: Not on file  Occupational History  . Not on file  Tobacco Use  . Smoking status: Current Every Day Smoker    Packs/day: 1.50    Years: 10.00    Pack years: 15.00    Types: Cigarettes  . Smokeless tobacco: Never Used  Vaping Use  . Vaping Use: Never used  Substance and Sexual Activity  . Alcohol use: Yes    Comment: occ  . Drug use: Not Currently    Types: Marijuana  . Sexual activity: Yes    Birth control/protection: Surgical  Other Topics Concern  . Not on file  Social History Narrative  .  Not on file   Social Determinants of Health   Financial Resource Strain: Not on file  Food Insecurity: Not on file  Transportation Needs: Not on file  Physical Activity: Not on file  Stress: Not on file  Social Connections: Not on file  Intimate Partner Violence: Not on file    Family History  Problem Relation Age of Onset  . Diabetes Father   . CAD Father   . Breast cancer Sister 20  . Alcohol abuse Neg Hx   . Arthritis Neg Hx   . Asthma Neg Hx   . Birth defects Neg Hx   . Cancer Neg Hx   . COPD Neg Hx   .  Depression Neg Hx   . Drug abuse Neg Hx   . Early death Neg Hx   . Hearing loss Neg Hx   . Heart disease Neg Hx   . Hyperlipidemia Neg Hx   . Hypertension Neg Hx   . Kidney disease Neg Hx   . Learning disabilities Neg Hx   . Mental illness Neg Hx   . Mental retardation Neg Hx   . Miscarriages / Stillbirths Neg Hx   . Stroke Neg Hx   . Vision loss Neg Hx     ROS: Menorrhagia but no fevers or chills, productive cough, hemoptysis, dysphasia, odynophagia, melena, hematochezia, dysuria, hematuria, rash, seizure activity, orthopnea, PND, pedal edema, claudication. Remaining systems are negative.  Physical Exam:   Blood pressure 134/78, pulse 87, height 5\' 5"  (1.651 m), weight 195 lb 6.4 oz (88.6 kg), SpO2 95 %.  General:  Well developed/well nourished in NAD Skin warm/dry Patient not depressed No peripheral clubbing Back-normal HEENT-normal/normal eyelids Neck supple/normal carotid upstroke bilaterally; no bruits; no JVD; no thyromegaly chest - CTA/ normal expansion CV - RRR/normal S1 and S2; no murmurs, rubs or gallops;  PMI nondisplaced Abdomen -NT/ND, no HSM, no mass, + bowel sounds, no bruit 2+ femoral pulses, no bruits Ext-no edema, chords, 2+ DP Neuro-grossly nonfocal  ECG -January 08, 2021-sinus rhythm with no ST changes.  Personally reviewed  A/P  1 preoperative evaluation prior to fibroid surgery-she has good functional capacity.  She is able to  climb 3 flights of stairs with no chest pain and also ambulate 1 mile.  Her recent electrocardiogram in January showed no ST changes.  She does not require ischemia evaluation prior to surgery.  2 question history of coronary artery disease-this is not definitive.  I will arrange an echocardiogram to assess LV function and rule out wall motion abnormality.  3 peripheral vascular disease-followed by vascular surgery.  Continue aspirin and statin.  4 tobacco abuse-we discussed the importance of discontinuing.  Kirk Ruths, MD

## 2021-03-13 ENCOUNTER — Ambulatory Visit (INDEPENDENT_AMBULATORY_CARE_PROVIDER_SITE_OTHER): Payer: Medicaid Other | Admitting: Cardiology

## 2021-03-13 ENCOUNTER — Encounter: Payer: Self-pay | Admitting: Cardiology

## 2021-03-13 ENCOUNTER — Other Ambulatory Visit: Payer: Self-pay

## 2021-03-13 VITALS — BP 134/78 | HR 87 | Ht 65.0 in | Wt 195.4 lb

## 2021-03-13 DIAGNOSIS — I2510 Atherosclerotic heart disease of native coronary artery without angina pectoris: Secondary | ICD-10-CM | POA: Diagnosis not present

## 2021-03-13 DIAGNOSIS — Z01810 Encounter for preprocedural cardiovascular examination: Secondary | ICD-10-CM

## 2021-03-13 DIAGNOSIS — I252 Old myocardial infarction: Secondary | ICD-10-CM | POA: Diagnosis not present

## 2021-03-13 NOTE — Patient Instructions (Signed)
  Testing/Procedures:  Your physician has requested that you have an echocardiogram. Echocardiography is a painless test that uses sound waves to create images of your heart. It provides your doctor with information about the size and shape of your heart and how well your heart's chambers and valves are working. This procedure takes approximately one hour. There are no restrictions for this procedure. [address]   Follow-Up: At [facility], you and your health needs are our priority.  As part of our continuing mission to provide you with exceptional heart care, we have created designated Provider Care Teams.  These Care Teams include your primary Cardiologist (physician) and Advanced Practice Providers (APPs -  Physician Assistants and Nurse Practitioners) who all work together to provide you with the care you need, when you need it.  We recommend signing up for the patient portal called "MyChart".  Sign up information is provided on this After Visit Summary.  MyChart is used to connect with patients for Virtual Visits (Telemedicine).  Patients are able to view lab/test results, encounter notes, upcoming appointments, etc.  Non-urgent messages can be sent to your provider as well.   To learn more about what you can do with MyChart, go to [email or web address].    Your next appointment:    AS NEEDED 

## 2021-03-18 ENCOUNTER — Other Ambulatory Visit: Payer: Self-pay | Admitting: Obstetrics and Gynecology

## 2021-03-18 DIAGNOSIS — D250 Submucous leiomyoma of uterus: Secondary | ICD-10-CM

## 2021-03-19 ENCOUNTER — Ambulatory Visit
Admission: RE | Admit: 2021-03-19 | Discharge: 2021-03-19 | Disposition: A | Discharge status: Home / Self Care | Payer: Medicaid Other | Source: Ambulatory Visit | Attending: Obstetrics and Gynecology | Admitting: Obstetrics and Gynecology

## 2021-03-19 ENCOUNTER — Encounter: Payer: Self-pay | Admitting: *Deleted

## 2021-03-19 ENCOUNTER — Other Ambulatory Visit: Payer: Self-pay

## 2021-03-19 ENCOUNTER — Other Ambulatory Visit: Payer: Self-pay | Admitting: Interventional Radiology

## 2021-03-19 DIAGNOSIS — D259 Leiomyoma of uterus, unspecified: Secondary | ICD-10-CM | POA: Diagnosis not present

## 2021-03-19 DIAGNOSIS — D250 Submucous leiomyoma of uterus: Secondary | ICD-10-CM

## 2021-03-19 HISTORY — PX: IR RADIOLOGIST EVAL & MGMT: IMG5224

## 2021-03-19 NOTE — Consult Note (Signed)
Chief Complaint: Patient was consulted remotely today (TeleHealth) for treatment of symptomatic fibroids, at the request of Bass,Lawrence A.    Referring Physician(s): Bass,Lawrence A  History of Present Illness: Jasmin Stephenson is a 50 y.o. female, who joined [place] today by telemedicine, for discussion of uterine artery embolization for symptomatic fibroids.  We confirmed her identity with 2 identifiers.  Jasmin Stephenson has been experiencing heavy periods for the past two years. The bleeding has increased over that time, to the point that they amount of blood can't be contained by pads/tampons. She has also had pelvic pain and increased bladder pressure over that time.  She has had 3 children, 86, 91, and 73 years old. She does not wish to undergo hysterectomy at this time.   She had an endometrial biopsy on 01/23/2021 which showed proliferative endometrium, with no evidence hyperplasia or carcinoma.    Past Medical History:  Diagnosis Date  . Allergy   . Anemia    1 unit  . Anxiety   . Arthritis    and tendonitis  . Asthma   . Breast calcification, right   . Cervical herniated disc 2017  . Depression   . Dysmenorrhea 11/2020  . Family history of adverse reaction to anesthesia   . GERD (gastroesophageal reflux disease)   . Hyperlipidemia   . Menorrhagia 11/2020  . Myocardial infarction [facility]) yrs ago   " Mild" no cardiologist  . Neck fracture ([place])    cervical neck fracture  . Vitamin D deficiency     Past Surgical History:  Procedure Laterality Date  . ABDOMINAL SURGERY    . APPENDECTOMY  yrs ago  . BLADDER SURGERY    . CERVICAL DISC ARTHROPLASTY N/A 09/21/2016   Procedure: CERVICAL SIX- CERVICAL SEVEN ARTHROPLASTY;  Surgeon: Kevan Ny Ditty, MD;  Location: [place];  Service: Neurosurgery;  Laterality: N/A;  C6-7 Arthroplasty  . CHOLECYSTECTOMY N/A 10/10/2019   Procedure: LAPAROSCOPIC CHOLECYSTECTOMY WITH INTRAOPERATIVE CHOLANGIOGRAM;  Surgeon: Donnie Mesa,  MD;  Location: [place];  Service: General;  Laterality: N/A;  . FOOT SURGERY Left    corrections on foot  . IRRIGATION AND DEBRIDEMENT ABSCESS Right 09/10/2014   Procedure: IRRIGATION AND DEBRIDEMENT RIGHT BREAST  ABSCESS;  Surgeon: Gayland Curry, MD;  Location: [facility];  Service: General;  Laterality: Right;  . SYNDACTYLIZATION Right 01/08/2021   Procedure: SYNDACTYLIZATION;  Surgeon: Evelina Bucy, DPM;  Location: [facility];  Service: Podiatry;  Laterality: Right;  . TUBAL LIGATION     per pt   . WISDOM TOOTH EXTRACTION      Allergies: Penicillins  Medications: Prior to Admission medications   Medication Sig Start Date End Date Taking? Authorizing Provider  acetaminophen (TYLENOL) 500 MG tablet Take 500 mg by mouth every 6 (six) hours as needed.    [provider]  albuterol (PROVENTIL) (2.5 MG/3ML) 0.083% nebulizer solution Take 3 mLs (2.5 mg total) by nebulization every 6 (six) hours as needed for wheezing or shortness of breath. 12/04/20   [name], FNP  albuterol (VENTOLIN HFA) 108 (90 Base) MCG/ACT inhaler Inhale 2 puffs into the lungs every 4 (four) hours as needed for wheezing or shortness of breath (cough, shortness of breath or wheezing.). 12/04/20   [name], FNP  aspirin EC 81 MG tablet Take 1 tablet (81 mg total) by mouth daily. 07/15/20   Serafina Mitchell, MD  CALCIUM ASCORBATE PO Take 10 mg by mouth daily.    [provider]  ciclopirox (PENLAC) 8 % solution Apply topically at bedtime. Apply over nail and surrounding skin. Apply daily over previous coat. Remove weekly with file or polish remover. 02/11/21   Evelina Bucy, DPM  cilostazol (PLETAL) 100 MG tablet Take 1 tablet (100 mg total) by mouth 2 (two) times daily before a meal. 07/15/20   Serafina Mitchell, MD  clobetasol ointment (TEMOVATE) 0.05 % Apply to the affected areas of the scalp 3-4 times weekly 02/05/20   [provider]  diphenhydrAMINE (BENADRYL) 50 MG  tablet Take 1 tablet (50 mg total) by mouth at bedtime as needed for itching. 01/03/21   [name], FNP  ferrous sulfate 324 (65 Fe) MG TBEC TAKE 1 TABLET (324 MG TOTAL) BY MOUTH IN THE MORNING AND AT BEDTIME. 01/06/21   [name], FNP  Fluocinolone Acetonide Body 0.01 % OIL  11/06/19   [provider]  fluticasone (FLONASE) 50 MCG/ACT nasal spray Place 2 sprays into both nostrils daily. 03/06/19   [name], FNP  ondansetron (ZOFRAN) 4 MG tablet Take 1 tablet (4 mg total) by mouth every 8 (eight) hours as needed for nausea or vomiting. 01/08/21   Evelina Bucy, DPM  rosuvastatin (CRESTOR) 10 MG tablet Take 1 tablet (10 mg total) by mouth daily. 07/15/20   Serafina Mitchell, MD  triamcinolone (KENALOG) 0.1 % Apply 1 application topically 2 (two) times daily. 01/03/21   [name], FNP  Vitamin D, Ergocalciferol, (DRISDOL) 1.25 MG (50000 UNIT) CAPS capsule Take 1 capsule (50,000 Units total) by mouth every 7 (seven) days. 12/13/20   [name], FNP     Family History  Problem Relation Age of Onset  . Diabetes Father   . CAD Father   . Breast cancer Sister 71  . Alcohol abuse Neg Hx   . Arthritis Neg Hx   . Asthma Neg Hx   . Birth defects Neg Hx   . Cancer Neg Hx   . COPD Neg Hx   . Depression Neg Hx   . Drug abuse Neg Hx   . Early death Neg Hx   . Hearing loss Neg Hx   . Heart disease Neg Hx   . Hyperlipidemia Neg Hx   . Hypertension Neg Hx   . Kidney disease Neg Hx   . Learning disabilities Neg Hx   . Mental illness Neg Hx   . Mental retardation Neg Hx   . Miscarriages / Stillbirths Neg Hx   . Stroke Neg Hx   . Vision loss Neg Hx     Social History   Socioeconomic History  . Marital status: Single    Spouse name: Not on file  . Number of children: 3  . Years of education: Not on file  . Highest education level: Not on file  Occupational History  . Not on file  Tobacco Use  . Smoking status: Current Every Day Smoker     Packs/day: 1.50    Years: 10.00    Pack years: 15.00    Types: Cigarettes  . Smokeless tobacco: Never Used  Vaping Use  . Vaping Use: Never used  Substance and Sexual Activity  . Alcohol use: Yes    Comment: occ  . Drug use: Not Currently    Types: Marijuana  . Sexual activity: Yes    Birth control/protection: Surgical  Other Topics Concern  . Not on file  Social History Narrative  . Not on file  Social Determinants of Health   Financial Resource Strain: Not on file  Food Insecurity: Not on file  Transportation Needs: Not on file  Physical Activity: Not on file  Stress: Not on file  Social Connections: Not on file    Review of Systems  Review of Systems: A 12 point ROS discussed and pertinent positives are indicated in the HPI above.  All other systems are negative.  Physical Exam No direct physical exam was performed  Vital Signs: There were no vitals taken for this visit.  Imaging: US PELVIC COMPLETE WITH TRANSVAGINAL  Result Date: 02/19/2021 CLINICAL DATA:  Menorrhagia. EXAM: TRANSABDOMINAL AND TRANSVAGINAL ULTRASOUND OF PELVIS TECHNIQUE: Both transabdominal and transvaginal ultrasound examinations of the pelvis were performed. Transabdominal technique was performed for global imaging of the pelvis including uterus, ovaries, adnexal regions, and pelvic cul-de-sac. It was necessary to proceed with endovaginal exam following the transabdominal exam to visualize the uterus and ovaries. COMPARISON:  CT 11/22/2006. FINDINGS: Uterus Measurements: 13.6 x 6.7 x 8.0 cm = volume: 381.4 mL. 6.2 x 5.1 x 5.1 cm fibroid. Endometrium Thickness: 7.9 mm. Endometrium is slightly heterogeneous. Tiny amount of endometrial canal fluid noted. Right ovary Measurements: 2.4 x 1.4 x 2.0 cm = volume: 3.5 mL. Normal appearance/no adnexal mass. Left ovary Measurements: 2.5 x 1.6 x 1.1 cm = volume: 2.3 mL. Normal appearance/no adnexal mass. Color flow to both ovaries noted. Other findings No abnormal  free fluid. IMPRESSION: 1. Endometrium is slightly heterogeneous. Tiny amount of endometrial canal fluid noted. If bleeding remains unresponsive to hormonal or medical therapy, sonohysterogram should be considered for focal lesion work-up. (Ref: Radiological Reasoning: Algorithmic Workup of Abnormal Vaginal Bleeding with Endovaginal Sonography and Sonohysterography. AJR 2008; [id number]:[id number]) 2. 6.2 x 5.1 x 5.1 cm fibroid. Electronically Signed   By: Marcello Moores  Register   On: 02/19/2021 10:01   Labs:  CBC: Recent Labs    12/04/20 1507 12/17/20 1136 12/20/20 1322 01/08/21 1037 01/23/21 1138  WBC 8.1 9.7  --   --  10.9*  HGB 7.2* 7.2* 8.4* 11.9* 10.2*  HCT 27.6* 27.1* 30.6* 35.0* 35.9  PLT 533* 455*  --   --  505*   COAGS: No results for input(s): INR, APTT in the last 8760 hours.  BMP: Recent Labs    12/17/20 1136 01/08/21 1037  NA 138 140  K 3.6 3.5  CL 105 103  CO2 22  --   GLUCOSE 98 112*  BUN 3* <3*  CALCIUM 8.8  --   CREATININE 1.02* 0.80  GFRNONAA 65  --   GFRAA 75  --     LIVER FUNCTION TESTS: Recent Labs    12/17/20 1136  BILITOT 0.2  AST 12  ALT 8  ALKPHOS 119  PROT 6.8  ALBUMIN 4.1    TUMOR MARKERS: No results for input(s): AFPTM, CEA, CA199, CHROMGRNA in the last 8760 hours.  Assessment and Plan:  Jasmin Stephenson is a 68 year old woman with symptomatic uterine fibroid with 3/3 classes of symptoms, Including bleeding, pain,i and mass effect on bladder.    During our conversation today, we discussed her anatomy, pathology, and treatment options for fibroids.  We discussed surgical hysterectomy versus uterine artery embolization.   Regarding uterine fibroid embolization, we discussed efficacy long-term outcomes, risks and benefits.  I discussed in detail with her the postop course which would require admission after the procedure for pain control and observation.  I described to her that her symptoms would take at least weeks to months to improve,  and minority of  patients may require retreatment.   Regarding risks, specific risks discussed include: post-embolization syndrome, bleeding, infection, contrast reaction, kidney/artery injury, need for further surgery/procedure, including hysterectomy, need for hospitalization, cardiopulmonary collapse, death.   Regarding post-embolization syndrome, I did let her know that this is essentially expected, with a typical prodromal syndrome lasting 4-7 days typically, and usually treated with medication/support such as hydration, rest, analgesics, PO nausea medications, and stool softeners. I recommended that she plan to take two weeks off of work.  We also discussed the need for MRI and possibility that she may be excluded by findings, or possibly requiring a follow up conversation if we feel that adenomyosis is present/contributing.  After discussing, she would like to proceed with treatment.   Plan: - plan for MRI to evaluate uterus/fibroids - plan tentatively to proceed with uterine artery embolization at first available.   Thank you for this interesting consult.  I greatly enjoyed meeting Jasmin Stephenson and look forward to participating in their care.  A copy of this report was sent to the requesting provider on this date.  Electronically Signed: Paula Libra Vittorio Mohs 03/19/2021, 9:13 AM   I spent a total of  30 Minutes   in remote  clinical consultation, greater than 50% of which was counseling/coordinating care for symptomatic uterine fibroids and uterine artery embolization.    Visit type: Audio only (telephone). Audio (no video) only due to patient's lack of internet/smartphone capability. Alternative for in-person consultation at [facility], [address], [place], [place]. This visit type was conducted due to national recommendations for restrictions regarding the COVID-19 Pandemic (e.g. social distancing).  This format is felt to be most appropriate for this patient at this time.  All issues  noted in this document were discussed and addressed.

## 2021-04-07 ENCOUNTER — Ambulatory Visit: Payer: Medicaid Other | Admitting: Cardiovascular Disease

## 2021-04-07 ENCOUNTER — Other Ambulatory Visit: Payer: Self-pay

## 2021-04-07 ENCOUNTER — Ambulatory Visit (HOSPITAL_COMMUNITY)
Admission: RE | Admit: 2021-04-07 | Discharge: 2021-04-07 | Disposition: A | Discharge status: Home / Self Care | Payer: Medicaid Other | Source: Ambulatory Visit | Attending: Interventional Radiology | Admitting: Interventional Radiology

## 2021-04-07 DIAGNOSIS — D250 Submucous leiomyoma of uterus: Secondary | ICD-10-CM | POA: Insufficient documentation

## 2021-04-07 DIAGNOSIS — D251 Intramural leiomyoma of uterus: Secondary | ICD-10-CM | POA: Diagnosis not present

## 2021-04-07 DIAGNOSIS — Z01818 Encounter for other preprocedural examination: Secondary | ICD-10-CM | POA: Diagnosis not present

## 2021-04-07 MED ORDER — GADOBUTROL 1 MMOL/ML IV SOLN
10.0000 mL | Freq: Once | INTRAVENOUS | Status: AC | PRN
  Administered 2021-04-07: 10 mL via INTRAVENOUS

## 2021-04-09 ENCOUNTER — Other Ambulatory Visit: Payer: Self-pay

## 2021-04-09 ENCOUNTER — Ambulatory Visit (HOSPITAL_COMMUNITY): Payer: Medicaid Other | Attending: Internal Medicine

## 2021-04-09 DIAGNOSIS — I2510 Atherosclerotic heart disease of native coronary artery without angina pectoris: Secondary | ICD-10-CM | POA: Insufficient documentation

## 2021-04-09 LAB — ECHOCARDIOGRAM COMPLETE
Area-P 1/2: 4.93 cm2
S' Lateral: 3 cm

## 2021-04-10 ENCOUNTER — Other Ambulatory Visit: Payer: Self-pay | Admitting: Obstetrics and Gynecology

## 2021-04-10 DIAGNOSIS — D250 Submucous leiomyoma of uterus: Secondary | ICD-10-CM

## 2021-04-17 ENCOUNTER — Other Ambulatory Visit: Payer: Medicaid Other

## 2021-04-22 ENCOUNTER — Other Ambulatory Visit (HOSPITAL_COMMUNITY): Payer: Self-pay | Admitting: Interventional Radiology

## 2021-04-22 DIAGNOSIS — D259 Leiomyoma of uterus, unspecified: Secondary | ICD-10-CM

## 2021-05-23 ENCOUNTER — Other Ambulatory Visit (HOSPITAL_COMMUNITY)
Admission: RE | Admit: 2021-05-23 | Discharge: 2021-05-23 | Disposition: A | Discharge status: Home / Self Care | Payer: Medicaid Other | Source: Ambulatory Visit | Attending: Interventional Radiology | Admitting: Interventional Radiology

## 2021-05-23 ENCOUNTER — Other Ambulatory Visit: Payer: Self-pay | Admitting: Student

## 2021-05-23 DIAGNOSIS — Z01812 Encounter for preprocedural laboratory examination: Secondary | ICD-10-CM | POA: Diagnosis not present

## 2021-05-23 DIAGNOSIS — Z20822 Contact with and (suspected) exposure to covid-19: Secondary | ICD-10-CM | POA: Insufficient documentation

## 2021-05-23 LAB — SARS CORONAVIRUS 2 (TAT 6-24 HRS): SARS Coronavirus 2: NEGATIVE

## 2021-05-26 ENCOUNTER — Ambulatory Visit (HOSPITAL_COMMUNITY)
Admission: RE | Admit: 2021-05-26 | Discharge: 2021-05-26 | Disposition: A | Discharge status: Home / Self Care | Payer: Medicaid Other | Source: Ambulatory Visit | Attending: Interventional Radiology | Admitting: Interventional Radiology

## 2021-05-26 ENCOUNTER — Encounter (HOSPITAL_COMMUNITY): Payer: Self-pay

## 2021-05-26 ENCOUNTER — Inpatient Hospital Stay (HOSPITAL_COMMUNITY)
Admission: RE | Admit: 2021-05-26 | Discharge: 2021-05-28 | DRG: 750 | Disposition: A | Discharge status: Home / Self Care | Payer: Medicaid Other | Attending: Interventional Radiology | Admitting: Interventional Radiology

## 2021-05-26 ENCOUNTER — Other Ambulatory Visit: Payer: Self-pay

## 2021-05-26 DIAGNOSIS — F17210 Nicotine dependence, cigarettes, uncomplicated: Secondary | ICD-10-CM | POA: Diagnosis present

## 2021-05-26 DIAGNOSIS — Z7982 Long term (current) use of aspirin: Secondary | ICD-10-CM

## 2021-05-26 DIAGNOSIS — Z803 Family history of malignant neoplasm of breast: Secondary | ICD-10-CM

## 2021-05-26 DIAGNOSIS — M1990 Unspecified osteoarthritis, unspecified site: Secondary | ICD-10-CM | POA: Diagnosis present

## 2021-05-26 DIAGNOSIS — F419 Anxiety disorder, unspecified: Secondary | ICD-10-CM | POA: Diagnosis present

## 2021-05-26 DIAGNOSIS — E785 Hyperlipidemia, unspecified: Secondary | ICD-10-CM | POA: Diagnosis present

## 2021-05-26 DIAGNOSIS — K219 Gastro-esophageal reflux disease without esophagitis: Secondary | ICD-10-CM | POA: Diagnosis present

## 2021-05-26 DIAGNOSIS — I252 Old myocardial infarction: Secondary | ICD-10-CM

## 2021-05-26 DIAGNOSIS — F32A Depression, unspecified: Secondary | ICD-10-CM | POA: Diagnosis present

## 2021-05-26 DIAGNOSIS — Z79899 Other long term (current) drug therapy: Secondary | ICD-10-CM

## 2021-05-26 DIAGNOSIS — Z833 Family history of diabetes mellitus: Secondary | ICD-10-CM

## 2021-05-26 DIAGNOSIS — J45909 Unspecified asthma, uncomplicated: Secondary | ICD-10-CM | POA: Diagnosis present

## 2021-05-26 DIAGNOSIS — Z981 Arthrodesis status: Secondary | ICD-10-CM

## 2021-05-26 DIAGNOSIS — R112 Nausea with vomiting, unspecified: Secondary | ICD-10-CM | POA: Diagnosis not present

## 2021-05-26 DIAGNOSIS — D251 Intramural leiomyoma of uterus: Principal | ICD-10-CM | POA: Diagnosis present

## 2021-05-26 DIAGNOSIS — N920 Excessive and frequent menstruation with regular cycle: Secondary | ICD-10-CM | POA: Diagnosis not present

## 2021-05-26 DIAGNOSIS — D259 Leiomyoma of uterus, unspecified: Secondary | ICD-10-CM | POA: Diagnosis not present

## 2021-05-26 DIAGNOSIS — I708 Atherosclerosis of other arteries: Secondary | ICD-10-CM | POA: Diagnosis present

## 2021-05-26 DIAGNOSIS — Z9049 Acquired absence of other specified parts of digestive tract: Secondary | ICD-10-CM

## 2021-05-26 DIAGNOSIS — Z8249 Family history of ischemic heart disease and other diseases of the circulatory system: Secondary | ICD-10-CM

## 2021-05-26 DIAGNOSIS — Z880 Allergy status to penicillin: Secondary | ICD-10-CM

## 2021-05-26 HISTORY — PX: IR US GUIDE VASC ACCESS LEFT: IMG2389

## 2021-05-26 HISTORY — PX: IR EMBO TUMOR ORGAN ISCHEMIA INFARCT INC GUIDE ROADMAPPING: IMG5449

## 2021-05-26 HISTORY — PX: IR ANGIOGRAM SELECTIVE EACH ADDITIONAL VESSEL: IMG667

## 2021-05-26 HISTORY — PX: IR ANGIOGRAM VISCERAL SELECTIVE: IMG657

## 2021-05-26 HISTORY — PX: IR RENAL BILAT S&I MOD SED: IMG656

## 2021-05-26 LAB — PROTIME-INR
INR: 1 (ref 0.8–1.2)
Prothrombin Time: 12.7 seconds (ref 11.4–15.2)

## 2021-05-26 LAB — CBC WITH DIFFERENTIAL/PLATELET
Abs Immature Granulocytes: 0.01 10*3/uL (ref 0.00–0.07)
Basophils Absolute: 0.1 10*3/uL (ref 0.0–0.1)
Basophils Relative: 1 %
Eosinophils Absolute: 0.3 10*3/uL (ref 0.0–0.5)
Eosinophils Relative: 4 %
HCT: 29.6 % — ABNORMAL LOW (ref 36.0–46.0)
Hemoglobin: 8.4 g/dL — ABNORMAL LOW (ref 12.0–15.0)
Immature Granulocytes: 0 %
Lymphocytes Relative: 31 %
Lymphs Abs: 2.7 10*3/uL (ref 0.7–4.0)
MCH: 21.1 pg — ABNORMAL LOW (ref 26.0–34.0)
MCHC: 28.4 g/dL — ABNORMAL LOW (ref 30.0–36.0)
MCV: 74.4 fL — ABNORMAL LOW (ref 80.0–100.0)
Monocytes Absolute: 0.8 10*3/uL (ref 0.1–1.0)
Monocytes Relative: 10 %
Neutro Abs: 4.8 10*3/uL (ref 1.7–7.7)
Neutrophils Relative %: 54 %
Platelets: 450 10*3/uL — ABNORMAL HIGH (ref 150–400)
RBC: 3.98 MIL/uL (ref 3.87–5.11)
RDW: 19.2 % — ABNORMAL HIGH (ref 11.5–15.5)
WBC: 8.7 10*3/uL (ref 4.0–10.5)
nRBC: 0 % (ref 0.0–0.2)

## 2021-05-26 LAB — BASIC METABOLIC PANEL
Anion gap: 4 — ABNORMAL LOW (ref 5–15)
BUN: 10 mg/dL (ref 6–20)
CO2: 27 mmol/L (ref 22–32)
Calcium: 8.7 mg/dL — ABNORMAL LOW (ref 8.9–10.3)
Chloride: 110 mmol/L (ref 98–111)
Creatinine, Ser: 1 mg/dL (ref 0.44–1.00)
GFR, Estimated: >60 mL/min (ref >60)
Glucose, Bld: 100 mg/dL — ABNORMAL HIGH (ref 70–99)
Potassium: 3.6 mmol/L (ref 3.5–5.1)
Sodium: 141 mmol/L (ref 135–145)

## 2021-05-26 LAB — HCG, SERUM, QUALITATIVE: Preg, Serum: NEGATIVE

## 2021-05-26 MED ORDER — SODIUM CHLORIDE 0.9% FLUSH: 9.0000 mL | INTRAVENOUS | Status: DC | PRN

## 2021-05-26 MED ORDER — SODIUM CHLORIDE 0.9 % IV SOLN: 250.0000 mL | INTRAVENOUS | Status: DC | PRN

## 2021-05-26 MED ORDER — KETOROLAC TROMETHAMINE 30 MG/ML IJ SOLN
INTRAMUSCULAR | Status: AC
  Filled 2021-05-26: qty 1

## 2021-05-26 MED ORDER — ONDANSETRON HCL 4 MG/2ML IJ SOLN
4.0000 mg | Freq: Four times a day (QID) | INTRAMUSCULAR | Status: DC | PRN
  Administered 2021-05-26: 4 mg via INTRAVENOUS
  Filled 2021-05-26: qty 2

## 2021-05-26 MED ORDER — ONDANSETRON HCL 4 MG PO TABS
4.0000 mg | ORAL_TABLET | Freq: Three times a day (TID) | ORAL | Status: DC | PRN
  Administered 2021-05-27: 4 mg via ORAL
  Filled 2021-05-26 (×2): qty 1

## 2021-05-26 MED ORDER — NITROGLYCERIN IN D5W 100-5 MCG/ML-% IV SOLN
INTRAVENOUS | Status: AC
  Filled 2021-05-26: qty 250

## 2021-05-26 MED ORDER — MIDAZOLAM HCL 2 MG/2ML IJ SOLN
INTRAMUSCULAR | Status: AC | PRN
  Administered 2021-05-26: 0.5 mg via INTRAVENOUS
  Administered 2021-05-26 (×5): 1 mg via INTRAVENOUS
  Administered 2021-05-26: 0.5 mg via INTRAVENOUS

## 2021-05-26 MED ORDER — KETOROLAC TROMETHAMINE 30 MG/ML IJ SOLN
INTRAMUSCULAR | Status: AC | PRN
  Administered 2021-05-26: 30 mg via INTRAVENOUS

## 2021-05-26 MED ORDER — VANCOMYCIN HCL IN DEXTROSE 1-5 GM/200ML-% IV SOLN: 1000.0000 mg | INTRAVENOUS | Status: AC

## 2021-05-26 MED ORDER — NALOXONE HCL 0.4 MG/ML IJ SOLN: 0.4000 mg | INTRAMUSCULAR | Status: DC | PRN

## 2021-05-26 MED ORDER — VERAPAMIL HCL 2.5 MG/ML IV SOLN: INTRA_ARTERIAL | Status: AC | PRN

## 2021-05-26 MED ORDER — ONDANSETRON HCL 4 MG/2ML IJ SOLN
INTRAMUSCULAR | Status: AC
  Filled 2021-05-26: qty 2

## 2021-05-26 MED ORDER — PROMETHAZINE HCL 25 MG RE SUPP: 25.0000 mg | Freq: Three times a day (TID) | RECTAL | Status: DC | PRN

## 2021-05-26 MED ORDER — DIPHENHYDRAMINE HCL 12.5 MG/5ML PO ELIX
12.5000 mg | ORAL_SOLUTION | Freq: Four times a day (QID) | ORAL | Status: DC | PRN
  Filled 2021-05-26: qty 5

## 2021-05-26 MED ORDER — HYDROMORPHONE 1 MG/ML IV SOLN
INTRAVENOUS | Status: DC
  Administered 2021-05-26: 30 mg via INTRAVENOUS
  Administered 2021-05-26: 1.2 mg via INTRAVENOUS
  Administered 2021-05-26 – 2021-05-27 (×3): 0 mg via INTRAVENOUS
  Filled 2021-05-26: qty 30

## 2021-05-26 MED ORDER — SODIUM CHLORIDE 0.9 % IV SOLN: INTRAVENOUS | Status: DC

## 2021-05-26 MED ORDER — KETOROLAC TROMETHAMINE 30 MG/ML IJ SOLN: 30.0000 mg | Freq: Once | INTRAMUSCULAR | Status: DC

## 2021-05-26 MED ORDER — CILOSTAZOL 100 MG PO TABS
100.0000 mg | ORAL_TABLET | Freq: Two times a day (BID) | ORAL | Status: DC
  Administered 2021-05-26 – 2021-05-28 (×4): 100 mg via ORAL
  Filled 2021-05-26 (×4): qty 1

## 2021-05-26 MED ORDER — LIDOCAINE HCL 1 % IJ SOLN
INTRAMUSCULAR | Status: AC
  Filled 2021-05-26: qty 20

## 2021-05-26 MED ORDER — SODIUM CHLORIDE 0.9% FLUSH: 3.0000 mL | INTRAVENOUS | Status: DC | PRN

## 2021-05-26 MED ORDER — DOCUSATE SODIUM 100 MG PO CAPS
100.0000 mg | ORAL_CAPSULE | Freq: Two times a day (BID) | ORAL | Status: DC
  Administered 2021-05-27 – 2021-05-28 (×3): 100 mg via ORAL
  Filled 2021-05-26 (×5): qty 1

## 2021-05-26 MED ORDER — NITROGLYCERIN 1 MG/10 ML FOR IR/CATH LAB
INTRA_ARTERIAL | Status: AC | PRN
  Administered 2021-05-26: 2 mL via INTRA_ARTERIAL

## 2021-05-26 MED ORDER — VERAPAMIL HCL 2.5 MG/ML IV SOLN
INTRAVENOUS | Status: AC | PRN
  Administered 2021-05-26: 2.5 mg via INTRAVENOUS

## 2021-05-26 MED ORDER — LIDOCAINE HCL 1 % IJ SOLN
INTRAMUSCULAR | Status: AC | PRN
  Administered 2021-05-26: 5 mL via INTRADERMAL

## 2021-05-26 MED ORDER — ALBUTEROL SULFATE HFA 108 (90 BASE) MCG/ACT IN AERS: 2.0000 | INHALATION_SPRAY | RESPIRATORY_TRACT | Status: DC | PRN

## 2021-05-26 MED ORDER — HEPARIN SODIUM (PORCINE) 1000 UNIT/ML IJ SOLN
INTRAMUSCULAR | Status: AC
  Filled 2021-05-26: qty 1

## 2021-05-26 MED ORDER — ONDANSETRON HCL 4 MG/2ML IJ SOLN
INTRAMUSCULAR | Status: AC | PRN
  Administered 2021-05-26: 4 mg via INTRAVENOUS

## 2021-05-26 MED ORDER — DIPHENHYDRAMINE HCL 50 MG/ML IJ SOLN: 12.5000 mg | Freq: Four times a day (QID) | INTRAMUSCULAR | Status: DC | PRN

## 2021-05-26 MED ORDER — FENTANYL CITRATE (PF) 100 MCG/2ML IJ SOLN
INTRAMUSCULAR | Status: AC
  Filled 2021-05-26: qty 4

## 2021-05-26 MED ORDER — VANCOMYCIN HCL IN DEXTROSE 1-5 GM/200ML-% IV SOLN
INTRAVENOUS | Status: AC
  Administered 2021-05-26: 1000 mg via INTRAVENOUS
  Filled 2021-05-26: qty 200

## 2021-05-26 MED ORDER — KETOROLAC TROMETHAMINE 30 MG/ML IJ SOLN
30.0000 mg | Freq: Four times a day (QID) | INTRAMUSCULAR | Status: DC
  Administered 2021-05-26 – 2021-05-27 (×4): 30 mg via INTRAVENOUS
  Filled 2021-05-26 (×4): qty 1

## 2021-05-26 MED ORDER — VERAPAMIL HCL 2.5 MG/ML IV SOLN
INTRAVENOUS | Status: AC
  Filled 2021-05-26: qty 2

## 2021-05-26 MED ORDER — MIDAZOLAM HCL 2 MG/2ML IJ SOLN
INTRAMUSCULAR | Status: AC
  Filled 2021-05-26: qty 6

## 2021-05-26 MED ORDER — FENTANYL CITRATE (PF) 100 MCG/2ML IJ SOLN
INTRAMUSCULAR | Status: AC | PRN
  Administered 2021-05-26: 50 ug via INTRAVENOUS
  Administered 2021-05-26 (×2): 25 ug via INTRAVENOUS
  Administered 2021-05-26 (×2): 50 ug via INTRAVENOUS

## 2021-05-26 MED ORDER — ALBUTEROL SULFATE (2.5 MG/3ML) 0.083% IN NEBU: 2.5000 mg | INHALATION_SOLUTION | Freq: Four times a day (QID) | RESPIRATORY_TRACT | Status: DC | PRN

## 2021-05-26 MED ORDER — PROMETHAZINE HCL 25 MG PO TABS: 25.0000 mg | ORAL_TABLET | Freq: Three times a day (TID) | ORAL | Status: DC | PRN

## 2021-05-26 MED ORDER — SODIUM CHLORIDE 0.9% FLUSH
3.0000 mL | Freq: Two times a day (BID) | INTRAVENOUS | Status: DC
  Administered 2021-05-27: 3 mL via INTRAVENOUS

## 2021-05-26 NOTE — Progress Notes (Signed)
Foley d/c per note on 05/26/2021 at 1555. Patient tolerated well. Will start voiding trial. Will continue to monitor.

## 2021-05-26 NOTE — Progress Notes (Signed)
Patient ID: Jasmin Stephenson, female   DOB: [birth year], 50 y.o.   MRN: [id number] Pt s/p UFE earlier today; cont to have pelvic pain, some N/V earlier; BP ok, HR 54, O2 sats 97% 2 liters; access site left radial artery soft, no hematoma, intact pulse; TR band in place; lungs clear; currently on dilaudid PCA; zofran prn nausea; heating pad to ant pelvis prn; for overnight obs; d/c foley later this evening; may raise HOB if desired

## 2021-05-26 NOTE — Sedation Documentation (Signed)
Patient is resting comfortably, snoring lightly, in NAD. 

## 2021-05-26 NOTE — Procedures (Signed)
Interventional Radiology Procedure Note  Procedure: Uterine fibroid embolization  Indication: Excessive bleeding  Findings: Please refer to procedural dictation for full description.  Complications: None  EBL: < 10 mL  Miachel Roux, MD [phone]

## 2021-05-26 NOTE — Progress Notes (Signed)
MEDICATION-RELATED CONSULT NOTE   IR Procedure Consult - Anticoagulant/Antiplatelet PTA/Inpatient Med List Review by Pharmacist    Procedure: Uterine fibroid embolization    Completed: 05/26/2021 13:20   Post-Procedural bleeding risk per IR MD assessment:  standard  Antithrombotic medications on inpatient or PTA profile prior to procedure:   none, but on Pletal    Recommended restart time per IR Post-Procedure Guidelines:  N/A  Plan:    SCDs ordered post op   Eudelia Bunch, Pharm.D 05/26/2021 1:49 PM

## 2021-05-26 NOTE — H&P (Signed)
Chief Complaint: Patient was seen in consultation today for uterine fibroids/bilateral uterine artery embolization.  Referring Physician(s): Bass,Lawrence A (OB/GYN)  Supervising Physician: Mir, Sharen Heck  Patient Status: [facility]  History of Present Illness: Jasmin Stephenson is a 50 y.o. female with a past medical history of hyperlipidemia, MI, asthma, GERD, anemia, arthritis, depression, and anxiety. She has been experiencing heavy periods secondary to uterine fibroids for approximately 2 years. She was recently referred to IR by her OB/GYN for further management and met with Dr. Dwaine Gale 03/19/2021 in consultation to discuss management options. At that time, through shared decision making, patient decided to pursue endovascular bilateral uterine artery embolization as management.  MR pelvis 04/07/2021: 1. Dominant 5.5 x 5.5 x 4.7 cm intramural fibroid in the anterior fundus shows diffuse enhancement after IV contrast administration. No pedunculated or intracavitary fibroids evident. 2. No adnexal mass.  Patient presents today for image-guided pelvic arteriogram with possible bilateral uterine artery embolization. Patient awake and alert sitting in bed. Complains of pelvic bloating, stable and chronic. Denies fever, chills, chest pain, dyspnea, abdominal pain, or headache.  LD Plavix/Aspirin 05/21/2021.   Past Medical History:  Diagnosis Date   Allergy    Anemia    1 unit   Anxiety    Arthritis    and tendonitis   Asthma    Breast calcification, right    Cervical herniated disc 2017   Depression    Dysmenorrhea 11/2020   Family history of adverse reaction to anesthesia    GERD (gastroesophageal reflux disease)    Hyperlipidemia    Menorrhagia 11/2020   Myocardial infarction [facility]) yrs ago   " Mild" no cardiologist   Neck fracture ([place])    cervical neck fracture   Vitamin D deficiency     Past Surgical History:  Procedure Laterality Date   ABDOMINAL SURGERY      APPENDECTOMY  yrs ago   [place] N/A 09/21/2016   Procedure: CERVICAL SIX- CERVICAL SEVEN ARTHROPLASTY;  Surgeon: Kevan Ny Ditty, MD;  Location: [place];  Service: Neurosurgery;  Laterality: N/A;  C6-7 Arthroplasty   CHOLECYSTECTOMY N/A 10/10/2019   Procedure: LAPAROSCOPIC CHOLECYSTECTOMY WITH INTRAOPERATIVE CHOLANGIOGRAM;  Surgeon: Donnie Mesa, MD;  Location: [place];  Service: General;  Laterality: N/A;   FOOT SURGERY Left    corrections on foot   IR RADIOLOGIST EVAL & MGMT  03/19/2021   IRRIGATION AND DEBRIDEMENT ABSCESS Right 09/10/2014   Procedure: IRRIGATION AND DEBRIDEMENT RIGHT BREAST  ABSCESS;  Surgeon: Gayland Curry, MD;  Location: [facility];  Service: General;  Laterality: Right;   SYNDACTYLIZATION Right 01/08/2021   Procedure: SYNDACTYLIZATION;  Surgeon: Evelina Bucy, DPM;  Location: [place];  Service: Podiatry;  Laterality: Right;   TUBAL LIGATION     per pt    WISDOM TOOTH EXTRACTION      Allergies: Penicillins  Medications: Prior to Admission medications   Medication Sig Start Date End Date Taking? Authorizing Provider  acetaminophen (TYLENOL) 500 MG tablet Take 500 mg by mouth every 6 (six) hours as needed.    [provider]  albuterol (PROVENTIL) (2.5 MG/3ML) 0.083% nebulizer solution Take 3 mLs (2.5 mg total) by nebulization every 6 (six) hours as needed for wheezing or shortness of breath. 12/04/20   [name], FNP  albuterol (VENTOLIN HFA) 108 (90 Base) MCG/ACT inhaler Inhale 2 puffs into the lungs every 4 (four) hours as needed for wheezing or shortness of  breath (cough, shortness of breath or wheezing.). 12/04/20   [name], FNP  aspirin EC 81 MG tablet Take 1 tablet (81 mg total) by mouth daily. 07/15/20   Serafina Mitchell, MD  CALCIUM ASCORBATE PO Take 10 mg by mouth daily.    [provider]  ciclopirox (PENLAC) 8 % solution Apply topically at bedtime. Apply over nail and  surrounding skin. Apply daily over previous coat. Remove weekly with file or polish remover. 02/11/21   Evelina Bucy, DPM  cilostazol (PLETAL) 100 MG tablet Take 1 tablet (100 mg total) by mouth 2 (two) times daily before a meal. 07/15/20   Serafina Mitchell, MD  clobetasol ointment (TEMOVATE) 0.05 % Apply to the affected areas of the scalp 3-4 times weekly 02/05/20   [provider]  diphenhydrAMINE (BENADRYL) 50 MG tablet Take 1 tablet (50 mg total) by mouth at bedtime as needed for itching. 01/03/21   [name], FNP  ferrous sulfate 324 (65 Fe) MG TBEC TAKE 1 TABLET (324 MG TOTAL) BY MOUTH IN THE MORNING AND AT BEDTIME. 01/06/21   [name], FNP  Fluocinolone Acetonide Body 0.01 % OIL  11/06/19   [provider]  fluticasone (FLONASE) 50 MCG/ACT nasal spray Place 2 sprays into both nostrils daily. 03/06/19   [name], FNP  ondansetron (ZOFRAN) 4 MG tablet Take 1 tablet (4 mg total) by mouth every 8 (eight) hours as needed for nausea or vomiting. 01/08/21   Evelina Bucy, DPM  rosuvastatin (CRESTOR) 10 MG tablet Take 1 tablet (10 mg total) by mouth daily. 07/15/20   Serafina Mitchell, MD  triamcinolone (KENALOG) 0.1 % Apply 1 application topically 2 (two) times daily. 01/03/21   [name], FNP  Vitamin D, Ergocalciferol, (DRISDOL) 1.25 MG (50000 UNIT) CAPS capsule Take 1 capsule (50,000 Units total) by mouth every 7 (seven) days. 12/13/20   [name], FNP     Family History  Problem Relation Age of Onset   Diabetes Father    CAD Father    Breast cancer Sister 54   Alcohol abuse Neg Hx    Arthritis Neg Hx    Asthma Neg Hx    Birth defects Neg Hx    Cancer Neg Hx    COPD Neg Hx    Depression Neg Hx    Drug abuse Neg Hx    Early death Neg Hx    Hearing loss Neg Hx    Heart disease Neg Hx    Hyperlipidemia Neg Hx    Hypertension Neg Hx    Kidney disease Neg Hx    Learning disabilities Neg Hx    Mental illness Neg Hx    Mental  retardation Neg Hx    Miscarriages / Stillbirths Neg Hx    Stroke Neg Hx    Vision loss Neg Hx     Social History   Socioeconomic History   Marital status: Single    Spouse name: Not on file   Number of children: 3   Years of education: Not on file   Highest education level: Not on file  Occupational History   Not on file  Tobacco Use   Smoking status: Every Day    Packs/day: 1.50    Years: 10.00    Pack years: 15.00    Types: Cigarettes   Smokeless tobacco: Never  Vaping Use   Vaping Use: Never used  Substance and Sexual Activity   Alcohol use:  Yes    Comment: occ   Drug use: Not Currently    Types: Marijuana   Sexual activity: Yes    Birth control/protection: Surgical  Other Topics Concern   Not on file  Social History Narrative   Not on file   Social Determinants of Health   Financial Resource Strain: Not on file  Food Insecurity: Not on file  Transportation Needs: Not on file  Physical Activity: Not on file  Stress: Not on file  Social Connections: Not on file     Review of Systems: A 12 point ROS discussed and pertinent positives are indicated in the HPI above.  All other systems are negative.  Review of Systems  Constitutional:  Negative for chills and fever.  Respiratory:  Negative for shortness of breath and wheezing.   Cardiovascular:  Negative for chest pain and palpitations.  Gastrointestinal:  Negative for abdominal pain.  Neurological:  Negative for headaches.  Psychiatric/Behavioral:  Negative for confusion.    Vital Signs: LMP 05/16/2021   Physical Exam Vitals and nursing note reviewed.  Constitutional:      General: She is not in acute distress. Cardiovascular:     Rate and Rhythm: Normal rate and regular rhythm.     Heart sounds: Normal heart sounds. No murmur heard. Pulmonary:     Effort: Pulmonary effort is normal. No respiratory distress.     Breath sounds: Normal breath sounds. No wheezing.  Skin:    General: Skin is warm  and dry.  Neurological:     Mental Status: She is alert and oriented to person, place, and time.     MD Evaluation Airway: WNL Heart: WNL Abdomen: WNL Chest/ Lungs: WNL ASA  Classification: 3 Mallampati/Airway Score: Two   Imaging: No results found.  Labs:  CBC: Recent Labs    12/04/20 1507 12/17/20 1136 12/20/20 1322 01/08/21 1037 01/23/21 1138  WBC 8.1 9.7  --   --  10.9*  HGB 7.2* 7.2* 8.4* 11.9* 10.2*  HCT 27.6* 27.1* 30.6* 35.0* 35.9  PLT 533* 455*  --   --  505*    COAGS: No results for input(s): INR, APTT in the last 8760 hours.  BMP: Recent Labs    12/17/20 1136 01/08/21 1037  NA 138 140  K 3.6 3.5  CL 105 103  CO2 22  --   GLUCOSE 98 112*  BUN 3* <3*  CALCIUM 8.8  --   CREATININE 1.02* 0.80  GFRNONAA 65  --   GFRAA 75  --     LIVER FUNCTION TESTS: Recent Labs    12/17/20 1136  BILITOT 0.2  AST 12  ALT 8  ALKPHOS 119  PROT 6.8  ALBUMIN 4.1     Assessment and Plan:  Symptomatic uterine fibroids (heavy menstrual bleeding). Plan for image-guided pelvic arteriogram with possible bilateral uterine artery embolization today in IR with Dr. Dwaine Gale. Patient will be admitted to [facility] following procedure for overnight observation/pain control. Patient is NPO. Afebrile. Plavix/Aspirin held per IR protocol. INR/hCG pending.  The risks and benefits of embolization were discussed with the patient including, but not limited to bleeding, infection, vascular injury, post operative pain, or contrast induced renal failure. This procedure involves the use of X-rays and because of the nature of the planned procedure, it is possible that we will have prolonged use of X-ray fluoroscopy. Potential radiation risks to you include (but are not limited to) the following: - A slightly elevated risk for cancer several years later in life.  This risk is typically less than 0.5% percent. This risk is low in comparison to the normal incidence of human cancer, which is  33% for women and 50% for men according to the [employer]. - Radiation induced injury can include skin redness, resembling a rash, tissue breakdown / ulcers and hair loss (which can be temporary or permanent).  The likelihood of either of these occurring depends on the difficulty of the procedure and whether you are sensitive to radiation due to previous procedures, disease, or genetic conditions.  IF your procedure requires a prolonged use of radiation, you will be notified and given written instructions for further action.  It is your responsibility to monitor the irradiated area for the 2 weeks following the procedure and to notify your physician if you are concerned that you have suffered a radiation induced injury.   All of the patient's questions were answered, patient is agreeable to proceed. Consent signed and in chart.   Thank you for this interesting consult.  I greatly enjoyed meeting Jasmin Stephenson and look forward to participating in their care.  A copy of this report was sent to the requesting provider on this date.  Electronically Signed: Earley Abide, PA-C 05/26/2021, 8:31 AM   I spent a total of 25 Minutes in face to face in clinical consultation, greater than 50% of which was counseling/coordinating care for uterine fibroids/bilateral uterine artery embolization.

## 2021-05-27 DIAGNOSIS — N920 Excessive and frequent menstruation with regular cycle: Secondary | ICD-10-CM | POA: Diagnosis present

## 2021-05-27 DIAGNOSIS — Z833 Family history of diabetes mellitus: Secondary | ICD-10-CM | POA: Diagnosis not present

## 2021-05-27 DIAGNOSIS — I708 Atherosclerosis of other arteries: Secondary | ICD-10-CM | POA: Diagnosis present

## 2021-05-27 DIAGNOSIS — Z8249 Family history of ischemic heart disease and other diseases of the circulatory system: Secondary | ICD-10-CM | POA: Diagnosis not present

## 2021-05-27 DIAGNOSIS — F32A Depression, unspecified: Secondary | ICD-10-CM | POA: Diagnosis present

## 2021-05-27 DIAGNOSIS — I252 Old myocardial infarction: Secondary | ICD-10-CM | POA: Diagnosis not present

## 2021-05-27 DIAGNOSIS — J45909 Unspecified asthma, uncomplicated: Secondary | ICD-10-CM | POA: Diagnosis present

## 2021-05-27 DIAGNOSIS — E785 Hyperlipidemia, unspecified: Secondary | ICD-10-CM | POA: Diagnosis present

## 2021-05-27 DIAGNOSIS — Z803 Family history of malignant neoplasm of breast: Secondary | ICD-10-CM | POA: Diagnosis not present

## 2021-05-27 DIAGNOSIS — Z880 Allergy status to penicillin: Secondary | ICD-10-CM | POA: Diagnosis not present

## 2021-05-27 DIAGNOSIS — F419 Anxiety disorder, unspecified: Secondary | ICD-10-CM | POA: Diagnosis present

## 2021-05-27 DIAGNOSIS — Z9049 Acquired absence of other specified parts of digestive tract: Secondary | ICD-10-CM | POA: Diagnosis not present

## 2021-05-27 DIAGNOSIS — D259 Leiomyoma of uterus, unspecified: Secondary | ICD-10-CM | POA: Diagnosis not present

## 2021-05-27 DIAGNOSIS — R112 Nausea with vomiting, unspecified: Secondary | ICD-10-CM | POA: Diagnosis not present

## 2021-05-27 DIAGNOSIS — Z9889 Other specified postprocedural states: Secondary | ICD-10-CM | POA: Diagnosis not present

## 2021-05-27 DIAGNOSIS — D251 Intramural leiomyoma of uterus: Secondary | ICD-10-CM | POA: Diagnosis present

## 2021-05-27 DIAGNOSIS — Z7982 Long term (current) use of aspirin: Secondary | ICD-10-CM | POA: Diagnosis not present

## 2021-05-27 DIAGNOSIS — M1990 Unspecified osteoarthritis, unspecified site: Secondary | ICD-10-CM | POA: Diagnosis present

## 2021-05-27 DIAGNOSIS — F17210 Nicotine dependence, cigarettes, uncomplicated: Secondary | ICD-10-CM | POA: Diagnosis present

## 2021-05-27 DIAGNOSIS — Z79899 Other long term (current) drug therapy: Secondary | ICD-10-CM | POA: Diagnosis not present

## 2021-05-27 DIAGNOSIS — Z981 Arthrodesis status: Secondary | ICD-10-CM | POA: Diagnosis not present

## 2021-05-27 DIAGNOSIS — K219 Gastro-esophageal reflux disease without esophagitis: Secondary | ICD-10-CM | POA: Diagnosis present

## 2021-05-27 MED ORDER — ONDANSETRON HCL 4 MG/2ML IJ SOLN
4.0000 mg | Freq: Once | INTRAMUSCULAR | Status: AC
  Administered 2021-05-27: 4 mg via INTRAVENOUS
  Filled 2021-05-27: qty 2

## 2021-05-27 MED ORDER — IBUPROFEN 200 MG PO TABS
600.0000 mg | ORAL_TABLET | Freq: Four times a day (QID) | ORAL | Status: DC
  Administered 2021-05-27 – 2021-05-28 (×3): 600 mg via ORAL
  Filled 2021-05-27 (×5): qty 3

## 2021-05-27 MED ORDER — OXYCODONE-ACETAMINOPHEN 5-325 MG PO TABS
1.0000 | ORAL_TABLET | Freq: Four times a day (QID) | ORAL | Status: DC | PRN
  Administered 2021-05-27: 1 via ORAL
  Filled 2021-05-27: qty 1

## 2021-05-27 MED ORDER — OXYCODONE HCL 5 MG PO TABS
5.0000 mg | ORAL_TABLET | Freq: Four times a day (QID) | ORAL | Status: DC | PRN
  Administered 2021-05-27 – 2021-05-28 (×2): 5 mg via ORAL
  Filled 2021-05-27 (×2): qty 1

## 2021-05-27 NOTE — Progress Notes (Addendum)
Referring Physician(s): Bass,Lawrence  Supervising Physician: Jacqulynn Cadet  Patient Status:  [facility] - In-pt  Chief Complaint:  S/p bilateral uterine artery embolization with Dr. Dwaine Gale on 05/26/21  Subjective:  Patient was examined multiple times at the bedside.  10 am:  Patient states that she suffered from severe nausea due to IV narcotics last night. IV narcotics was transitioned to oral Percocet this morning and patient is tolerating oral narcotics better, but still has 6 out of 10 point pain scale which worsens with ambulating.  Reports persistent nausea, has been able to tolerate fluids only. Patient reports no BM, colace was given this morning.  1 PM:  Patient received IV Toradol and IV Zofran around 11, reports feeling better but still nauseated, still tolerating fluid only. Was able to void, no hematuria noted.    Allergies: Penicillins  Medications: Prior to Admission medications   Medication Sig Start Date End Date Taking? Authorizing Provider  acetaminophen (TYLENOL) 500 MG tablet Take 500 mg by mouth every 6 (six) hours as needed.    [provider]  albuterol (PROVENTIL) (2.5 MG/3ML) 0.083% nebulizer solution Take 3 mLs (2.5 mg total) by nebulization every 6 (six) hours as needed for wheezing or shortness of breath. 12/04/20   [name], FNP  albuterol (VENTOLIN HFA) 108 (90 Base) MCG/ACT inhaler Inhale 2 puffs into the lungs every 4 (four) hours as needed for wheezing or shortness of breath (cough, shortness of breath or wheezing.). 12/04/20   [name], FNP  aspirin EC 81 MG tablet Take 1 tablet (81 mg total) by mouth daily. 07/15/20   Serafina Mitchell, MD  CALCIUM ASCORBATE PO Take 10 mg by mouth daily.    [provider]  ciclopirox (PENLAC) 8 % solution Apply topically at bedtime. Apply over nail and surrounding skin. Apply daily over previous coat. Remove weekly with file or polish remover. 02/11/21   Evelina Bucy, DPM   cilostazol (PLETAL) 100 MG tablet Take 1 tablet (100 mg total) by mouth 2 (two) times daily before a meal. 07/15/20   Serafina Mitchell, MD  clobetasol ointment (TEMOVATE) 0.05 % Apply to the affected areas of the scalp 3-4 times weekly 02/05/20   [provider]  diphenhydrAMINE (BENADRYL) 50 MG tablet Take 1 tablet (50 mg total) by mouth at bedtime as needed for itching. 01/03/21   [name], FNP  ferrous sulfate 324 (65 Fe) MG TBEC TAKE 1 TABLET (324 MG TOTAL) BY MOUTH IN THE MORNING AND AT BEDTIME. 01/06/21   [name], FNP  Fluocinolone Acetonide Body 0.01 % OIL  11/06/19   [provider]  fluticasone (FLONASE) 50 MCG/ACT nasal spray Place 2 sprays into both nostrils daily. 03/06/19   [name], FNP  minoxidil (LONITEN) 2.5 MG tablet Take 1.25 mg by mouth at bedtime. 02/17/21   [provider]  ondansetron (ZOFRAN) 4 MG tablet Take 1 tablet (4 mg total) by mouth every 8 (eight) hours as needed for nausea or vomiting. 01/08/21   Evelina Bucy, DPM  rosuvastatin (CRESTOR) 10 MG tablet Take 1 tablet (10 mg total) by mouth daily. 07/15/20   Serafina Mitchell, MD  triamcinolone (KENALOG) 0.1 % Apply 1 application topically 2 (two) times daily. 01/03/21   [name], FNP  Vitamin D, Ergocalciferol, (DRISDOL) 1.25 MG (50000 UNIT) CAPS capsule Take 1 capsule (50,000 Units total) by mouth every 7 (seven) days. 12/13/20   [name], FNP  Vital Signs: BP 117/62   Pulse (!) 55   Temp 98.1 F (36.7 C) (Oral)   Resp 18   LMP 05/16/2021   SpO2 98%   Physical Exam Vitals reviewed.  Constitutional:      General: She is not in acute distress.    Appearance: Normal appearance. She is not ill-appearing.     Comments: Appears uncomfortable  HENT:     Head: Normocephalic and atraumatic.  Cardiovascular:     Rate and Rhythm: Normal rate and regular rhythm.     Pulses: Normal pulses.     Heart sounds: Normal heart sounds.  Pulmonary:      Effort: Pulmonary effort is normal.     Breath sounds: Normal breath sounds.  Abdominal:     General: Abdomen is flat. Bowel sounds are normal.     Palpations: Abdomen is soft.  Skin:    General: Skin is warm and dry.     Comments: Positive for completed healed left radial puncture site. Site is unremarkable with no erythema, edema, tenderness, bleeding or drainage. Left RP 2+   Neurological:     Mental Status: She is alert and oriented to person, place, and time.  Psychiatric:        Mood and Affect: Mood normal.        Behavior: Behavior normal.    Imaging: IR Angiogram Renal Bilateral Selective  Result Date: 05/26/2021 INDICATION: 50 year old woman with symptomatic uterine fibroid with bleeding, pain, and mass effect on bladder presents to IR for uterine fibroid embolization. Radial artery pulse evaluated in preop and was found to be 2+. Barbeau test results = Type A. EXAM: 1. Ultrasound-guided access of left radial artery 2. Aortogram 3. Left ovarian arteriogram 4. Bilateral renal arteriogram 5. Celiac arteriogram MEDICATIONS: Vancomycin 1 gm IV. The antibiotic was administered within 1 hour of the procedure ANESTHESIA/SEDATION: Moderate (conscious) sedation was employed during this procedure. A total of Versed 6 mg and Fentanyl 200 mcg was administered intravenously. Moderate Sedation Time: 2 hours in 17 minutes. The patient's level of consciousness and vital signs were monitored continuously by radiology nursing throughout the procedure under my direct supervision. CONTRAST:  145 mL of Omnipaque 300 intra arterial FLUOROSCOPY TIME:  Fluoroscopy Time: 38 minutes 36 seconds (1425 mGy). COMPLICATIONS: None immediate. PROCEDURE: Informed consent was obtained from the patient following explanation of the procedure, risks, benefits and alternatives. The patient understands, agrees and consents for the procedure. All questions were addressed. A time out was performed prior to the initiation of the  procedure. Maximal barrier sterile technique utilized including caps, mask, sterile gowns, sterile gloves, large sterile drape, hand hygiene, and chlorhexidine prep. Patient positioned supine on the angiography table. The left wrist skin prepped and draped in usual fashion. Ultrasound image documenting patency of the left radial artery was obtained and placed in permanent medical record. Sterile ultrasound probe cover and gel utilized throughout the procedure. Following local lidocaine administration, radial artery was accessed at the level of the wrist using a 21 gauge needle under continuous ultrasound guidance. 21 gauge needle exchanged for 5 French sheath over 0.021 inch guidewire. This sheath aspirated and flushed well. A combination of 2.5 mg of verapamil, 200 mcg of nitroglycerine, and 3000 units of heparin was combine with 5 mL of the patient's blood and injected slowly into the sheath over 1 minutes. 5 French Bernstein catheter and 0.035 inch Bentson wire advanced through the 5 French sheath to the level of the distal infrarenal abdominal aorta without  difficulty. Aortogram was performed. No significant opacification of uterine arteries was identified. 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 The catheter was retracted to the suprarenal abdominal aorta and aortogram was again performed which showed large hypertrophied bilateral ovarian arteries which supply the uterus. Bilateral renal angiogram was performed in order to identify the origin of the ovarian arteries. Both main renal arteries were patent and did not supply the ovarian arteries. The Bernstein catheter was exchanged for a Ultimate 1 catheter. The left ovarian artery was successfully selected. Left ovarian angiogram showed brisk flow to the fibroid as well as flow to the lower pole of the left kidney. High-flow Renegade microcatheter was advanced into the  proximal left ovarian artery, well beyond the origin of the accessory left lower pole renal artery. This was confirmed by angiogram. Left ovarian artery embolization was performed utilizing 1 vial of 500-700 micron and 1 vial of 700-900 micron embospheres. Post embolization angiogram showed minimal antegrade flow in the left ovarian artery. The accessory artery to the lower pole the left kidney remained patent. Despite prolonged efforts, I was unable to access the right ovarian artery. Celiac angiogram was performed to confirm that it did not originate from the gastroduodenal artery. Repeat aortogram showed the most likely origin near the right main renal artery, however I was unable to access this origin. During aortogram, there was little antegrade flow in the right ovarian artery, likely due to cross-filling and embolization from the left ovarian artery. Catheter was removed. The radial sheath was clotted. It was exchanged for a new 5 French sheath over 0.021 inch guidewire. The new sheath aspirated and flushed well. Combination of 2.5 mg of verapamil and 200 mcg of nitroglycerin was mixed with 3 mL of saline and combine with 5 mL of patient's blood and injected into the sheath. TR band was applied over the sheath access site and inflated with 15 mL of air. The sheath was removed. The TR band was slowly deflated until the radial pulse could be palpated distal to the access site. Final volume of air in the TR band was 10 mL. Radial pulse was intact. Patient tolerated procedure well. She was admitted for control of pain and nausea. IMPRESSION: Uterine fibroid embolization. Uterus mainly supplied by ovarian arteries. Left ovarian artery successfully embolized utilizing a combination of 1 vial of 500-700 micron and 1 vial of 700-900 micron embospheres. Unable to access right ovarian artery which likely originates near the ostium of the right main renal artery. Minimal antegrade flow seen in the right ovarian artery  on aortogram after embolization of the left ovarian artery. Electronically Signed   By: Miachel Roux M.D.   On: 05/26/2021 16:16   IR Angiogram Visceral Selective  Result Date: 05/26/2021 INDICATION: 50 year old woman with symptomatic uterine fibroid with bleeding, pain, and mass effect on bladder presents to IR for uterine fibroid embolization. Radial artery pulse evaluated in preop and was found to be 2+. Barbeau test results = Type A. EXAM: 1. Ultrasound-guided access of left radial artery 2. Aortogram 3. Left ovarian arteriogram 4. Bilateral renal arteriogram 5. Celiac arteriogram MEDICATIONS: Vancomycin 1 gm IV. The antibiotic was administered within 1 hour of the procedure ANESTHESIA/SEDATION: Moderate (conscious) sedation was employed during this procedure. A total of Versed 6 mg and Fentanyl 200 mcg was administered intravenously. Moderate Sedation Time: 2 hours in 17 minutes. The patient's level of consciousness and vital signs were monitored continuously  by radiology nursing throughout the procedure under my direct supervision. CONTRAST:  145 mL of Omnipaque 300 intra arterial FLUOROSCOPY TIME:  Fluoroscopy Time: 38 minutes 36 seconds (1425 mGy). COMPLICATIONS: None immediate. PROCEDURE: Informed consent was obtained from the patient following explanation of the procedure, risks, benefits and alternatives. The patient understands, agrees and consents for the procedure. All questions were addressed. A time out was performed prior to the initiation of the procedure. Maximal barrier sterile technique utilized including caps, mask, sterile gowns, sterile gloves, large sterile drape, hand hygiene, and chlorhexidine prep. Patient positioned supine on the angiography table. The left wrist skin prepped and draped in usual fashion. Ultrasound image documenting patency of the left radial artery was obtained and placed in permanent medical record. Sterile ultrasound probe cover and gel utilized throughout the  procedure. Following local lidocaine administration, radial artery was accessed at the level of the wrist using a 21 gauge needle under continuous ultrasound guidance. 21 gauge needle exchanged for 5 French sheath over 0.021 inch guidewire. This sheath aspirated and flushed well. A combination of 2.5 mg of verapamil, 200 mcg of nitroglycerine, and 3000 units of heparin was combine with 5 mL of the patient's blood and injected slowly into the sheath over 1 minutes. 5 French Bernstein catheter and 0.035 inch Bentson wire advanced through the 5 French sheath to the level of the distal infrarenal abdominal aorta without difficulty. Aortogram was performed. No significant opacification of uterine arteries was identified. 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 The catheter was retracted to the suprarenal abdominal aorta and aortogram was again performed which showed large hypertrophied bilateral ovarian arteries which supply the uterus. Bilateral renal angiogram was performed in order to identify the origin of the ovarian arteries. Both main renal arteries were patent and did not supply the ovarian arteries. The Bernstein catheter was exchanged for a Ultimate 1 catheter. The left ovarian artery was successfully selected. Left ovarian angiogram showed brisk flow to the fibroid as well as flow to the lower pole of the left kidney. High-flow Renegade microcatheter was advanced into the proximal left ovarian artery, well beyond the origin of the accessory left lower pole renal artery. This was confirmed by angiogram. Left ovarian artery embolization was performed utilizing 1 vial of 500-700 micron and 1 vial of 700-900 micron embospheres. Post embolization angiogram showed minimal antegrade flow in the left ovarian artery. The accessory artery to the lower pole the left kidney remained patent. Despite prolonged efforts, I was unable  to access the right ovarian artery. Celiac angiogram was performed to confirm that it did not originate from the gastroduodenal artery. Repeat aortogram showed the most likely origin near the right main renal artery, however I was unable to access this origin. During aortogram, there was little antegrade flow in the right ovarian artery, likely due to cross-filling and embolization from the left ovarian artery. Catheter was removed. The radial sheath was clotted. It was exchanged for a new 5 French sheath over 0.021 inch guidewire. The new sheath aspirated and flushed well. Combination of 2.5 mg of verapamil and 200 mcg of nitroglycerin was mixed with 3 mL of saline and combine with 5 mL of patient's blood and injected into the sheath. TR band was applied over the sheath access site and inflated with 15 mL of air. The sheath was removed. The TR band was slowly deflated until the radial pulse could be palpated distal to  the access site. Final volume of air in the TR band was 10 mL. Radial pulse was intact. Patient tolerated procedure well. She was admitted for control of pain and nausea. IMPRESSION: Uterine fibroid embolization. Uterus mainly supplied by ovarian arteries. Left ovarian artery successfully embolized utilizing a combination of 1 vial of 500-700 micron and 1 vial of 700-900 micron embospheres. Unable to access right ovarian artery which likely originates near the ostium of the right main renal artery. Minimal antegrade flow seen in the right ovarian artery on aortogram after embolization of the left ovarian artery. Electronically Signed   By: Miachel Roux M.D.   On: 05/26/2021 16:16   IR Angiogram Selective Each Additional Vessel  Result Date: 05/26/2021 INDICATION: 50 year old woman with symptomatic uterine fibroid with bleeding, pain, and mass effect on bladder presents to IR for uterine fibroid embolization. Radial artery pulse evaluated in preop and was found to be 2+. Barbeau test results = Type  A. EXAM: 1. Ultrasound-guided access of left radial artery 2. Aortogram 3. Left ovarian arteriogram 4. Bilateral renal arteriogram 5. Celiac arteriogram MEDICATIONS: Vancomycin 1 gm IV. The antibiotic was administered within 1 hour of the procedure ANESTHESIA/SEDATION: Moderate (conscious) sedation was employed during this procedure. A total of Versed 6 mg and Fentanyl 200 mcg was administered intravenously. Moderate Sedation Time: 2 hours in 17 minutes. The patient's level of consciousness and vital signs were monitored continuously by radiology nursing throughout the procedure under my direct supervision. CONTRAST:  145 mL of Omnipaque 300 intra arterial FLUOROSCOPY TIME:  Fluoroscopy Time: 38 minutes 36 seconds (1425 mGy). COMPLICATIONS: None immediate. PROCEDURE: Informed consent was obtained from the patient following explanation of the procedure, risks, benefits and alternatives. The patient understands, agrees and consents for the procedure. All questions were addressed. A time out was performed prior to the initiation of the procedure. Maximal barrier sterile technique utilized including caps, mask, sterile gowns, sterile gloves, large sterile drape, hand hygiene, and chlorhexidine prep. Patient positioned supine on the angiography table. The left wrist skin prepped and draped in usual fashion. Ultrasound image documenting patency of the left radial artery was obtained and placed in permanent medical record. Sterile ultrasound probe cover and gel utilized throughout the procedure. Following local lidocaine administration, radial artery was accessed at the level of the wrist using a 21 gauge needle under continuous ultrasound guidance. 21 gauge needle exchanged for 5 French sheath over 0.021 inch guidewire. This sheath aspirated and flushed well. A combination of 2.5 mg of verapamil, 200 mcg of nitroglycerine, and 3000 units of heparin was combine with 5 mL of the patient's blood and injected slowly into the  sheath over 1 minutes. 5 French Bernstein catheter and 0.035 inch Bentson wire advanced through the 5 French sheath to the level of the distal infrarenal abdominal aorta without difficulty. Aortogram was performed. No significant opacification of uterine arteries was identified. 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 The catheter was retracted to the suprarenal abdominal aorta and aortogram was again performed which showed large hypertrophied bilateral ovarian arteries which supply the uterus. Bilateral renal angiogram was performed in order to identify the origin of the ovarian arteries. Both main renal arteries were patent and did not supply the ovarian arteries. The Bernstein catheter was exchanged for a Ultimate 1 catheter. The left ovarian artery was successfully selected. Left ovarian angiogram showed brisk flow to the fibroid as well as flow to the lower  pole of the left kidney. High-flow Renegade microcatheter was advanced into the proximal left ovarian artery, well beyond the origin of the accessory left lower pole renal artery. This was confirmed by angiogram. Left ovarian artery embolization was performed utilizing 1 vial of 500-700 micron and 1 vial of 700-900 micron embospheres. Post embolization angiogram showed minimal antegrade flow in the left ovarian artery. The accessory artery to the lower pole the left kidney remained patent. Despite prolonged efforts, I was unable to access the right ovarian artery. Celiac angiogram was performed to confirm that it did not originate from the gastroduodenal artery. Repeat aortogram showed the most likely origin near the right main renal artery, however I was unable to access this origin. During aortogram, there was little antegrade flow in the right ovarian artery, likely due to cross-filling and embolization from the left ovarian artery. Catheter was removed. The radial  sheath was clotted. It was exchanged for a new 5 French sheath over 0.021 inch guidewire. The new sheath aspirated and flushed well. Combination of 2.5 mg of verapamil and 200 mcg of nitroglycerin was mixed with 3 mL of saline and combine with 5 mL of patient's blood and injected into the sheath. TR band was applied over the sheath access site and inflated with 15 mL of air. The sheath was removed. The TR band was slowly deflated until the radial pulse could be palpated distal to the access site. Final volume of air in the TR band was 10 mL. Radial pulse was intact. Patient tolerated procedure well. She was admitted for control of pain and nausea. IMPRESSION: Uterine fibroid embolization. Uterus mainly supplied by ovarian arteries. Left ovarian artery successfully embolized utilizing a combination of 1 vial of 500-700 micron and 1 vial of 700-900 micron embospheres. Unable to access right ovarian artery which likely originates near the ostium of the right main renal artery. Minimal antegrade flow seen in the right ovarian artery on aortogram after embolization of the left ovarian artery. Electronically Signed   By: Miachel Roux M.D.   On: 05/26/2021 16:16   IR US Guide Vasc Access Left  Result Date: 05/26/2021 INDICATION: 50 year old woman with symptomatic uterine fibroid with bleeding, pain, and mass effect on bladder presents to IR for uterine fibroid embolization. Radial artery pulse evaluated in preop and was found to be 2+. Barbeau test results = Type A. EXAM: 1. Ultrasound-guided access of left radial artery 2. Aortogram 3. Left ovarian arteriogram 4. Bilateral renal arteriogram 5. Celiac arteriogram MEDICATIONS: Vancomycin 1 gm IV. The antibiotic was administered within 1 hour of the procedure ANESTHESIA/SEDATION: Moderate (conscious) sedation was employed during this procedure. A total of Versed 6 mg and Fentanyl 200 mcg was administered intravenously. Moderate Sedation Time: 2 hours in 17 minutes. The  patient's level of consciousness and vital signs were monitored continuously by radiology nursing throughout the procedure under my direct supervision. CONTRAST:  145 mL of Omnipaque 300 intra arterial FLUOROSCOPY TIME:  Fluoroscopy Time: 38 minutes 36 seconds (1425 mGy). COMPLICATIONS: None immediate. PROCEDURE: Informed consent was obtained from the patient following explanation of the procedure, risks, benefits and alternatives. The patient understands, agrees and consents for the procedure. All questions were addressed. A time out was performed prior to the initiation of the procedure. Maximal barrier sterile technique utilized including caps, mask, sterile gowns, sterile gloves, large sterile drape, hand hygiene, and chlorhexidine prep. Patient positioned supine on the angiography table. The left wrist skin prepped and draped in usual fashion. Ultrasound image documenting patency  of the left radial artery was obtained and placed in permanent medical record. Sterile ultrasound probe cover and gel utilized throughout the procedure. Following local lidocaine administration, radial artery was accessed at the level of the wrist using a 21 gauge needle under continuous ultrasound guidance. 21 gauge needle exchanged for 5 French sheath over 0.021 inch guidewire. This sheath aspirated and flushed well. A combination of 2.5 mg of verapamil, 200 mcg of nitroglycerine, and 3000 units of heparin was combine with 5 mL of the patient's blood and injected slowly into the sheath over 1 minutes. 5 French Bernstein catheter and 0.035 inch Bentson wire advanced through the 5 French sheath to the level of the distal infrarenal abdominal aorta without difficulty. Aortogram was performed. No significant opacification of uterine arteries was identified. 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 The catheter was retracted to the suprarenal  abdominal aorta and aortogram was again performed which showed large hypertrophied bilateral ovarian arteries which supply the uterus. Bilateral renal angiogram was performed in order to identify the origin of the ovarian arteries. Both main renal arteries were patent and did not supply the ovarian arteries. The Bernstein catheter was exchanged for a Ultimate 1 catheter. The left ovarian artery was successfully selected. Left ovarian angiogram showed brisk flow to the fibroid as well as flow to the lower pole of the left kidney. High-flow Renegade microcatheter was advanced into the proximal left ovarian artery, well beyond the origin of the accessory left lower pole renal artery. This was confirmed by angiogram. Left ovarian artery embolization was performed utilizing 1 vial of 500-700 micron and 1 vial of 700-900 micron embospheres. Post embolization angiogram showed minimal antegrade flow in the left ovarian artery. The accessory artery to the lower pole the left kidney remained patent. Despite prolonged efforts, I was unable to access the right ovarian artery. Celiac angiogram was performed to confirm that it did not originate from the gastroduodenal artery. Repeat aortogram showed the most likely origin near the right main renal artery, however I was unable to access this origin. During aortogram, there was little antegrade flow in the right ovarian artery, likely due to cross-filling and embolization from the left ovarian artery. Catheter was removed. The radial sheath was clotted. It was exchanged for a new 5 French sheath over 0.021 inch guidewire. The new sheath aspirated and flushed well. Combination of 2.5 mg of verapamil and 200 mcg of nitroglycerin was mixed with 3 mL of saline and combine with 5 mL of patient's blood and injected into the sheath. TR band was applied over the sheath access site and inflated with 15 mL of air. The sheath was removed. The TR band was slowly deflated until the radial pulse  could be palpated distal to the access site. Final volume of air in the TR band was 10 mL. Radial pulse was intact. Patient tolerated procedure well. She was admitted for control of pain and nausea. IMPRESSION: Uterine fibroid embolization. Uterus mainly supplied by ovarian arteries. Left ovarian artery successfully embolized utilizing a combination of 1 vial of 500-700 micron and 1 vial of 700-900 micron embospheres. Unable to access right ovarian artery which likely originates near the ostium of the right main renal artery. Minimal antegrade flow seen in the right ovarian artery on aortogram after embolization of the left ovarian artery. Electronically Signed   By: Miachel Roux M.D.   On: 05/26/2021 16:16   IR EMBO  TUMOR ORGAN ISCHEMIA INFARCT INC GUIDE ROADMAPPING  Result Date: 05/26/2021 INDICATION: 50 year old woman with symptomatic uterine fibroid with bleeding, pain, and mass effect on bladder presents to IR for uterine fibroid embolization. Radial artery pulse evaluated in preop and was found to be 2+. Barbeau test results = Type A. EXAM: 1. Ultrasound-guided access of left radial artery 2. Aortogram 3. Left ovarian arteriogram 4. Bilateral renal arteriogram 5. Celiac arteriogram MEDICATIONS: Vancomycin 1 gm IV. The antibiotic was administered within 1 hour of the procedure ANESTHESIA/SEDATION: Moderate (conscious) sedation was employed during this procedure. A total of Versed 6 mg and Fentanyl 200 mcg was administered intravenously. Moderate Sedation Time: 2 hours in 17 minutes. The patient's level of consciousness and vital signs were monitored continuously by radiology nursing throughout the procedure under my direct supervision. CONTRAST:  145 mL of Omnipaque 300 intra arterial FLUOROSCOPY TIME:  Fluoroscopy Time: 38 minutes 36 seconds (1425 mGy). COMPLICATIONS: None immediate. PROCEDURE: Informed consent was obtained from the patient following explanation of the procedure, risks, benefits and  alternatives. The patient understands, agrees and consents for the procedure. All questions were addressed. A time out was performed prior to the initiation of the procedure. Maximal barrier sterile technique utilized including caps, mask, sterile gowns, sterile gloves, large sterile drape, hand hygiene, and chlorhexidine prep. Patient positioned supine on the angiography table. The left wrist skin prepped and draped in usual fashion. Ultrasound image documenting patency of the left radial artery was obtained and placed in permanent medical record. Sterile ultrasound probe cover and gel utilized throughout the procedure. Following local lidocaine administration, radial artery was accessed at the level of the wrist using a 21 gauge needle under continuous ultrasound guidance. 21 gauge needle exchanged for 5 French sheath over 0.021 inch guidewire. This sheath aspirated and flushed well. A combination of 2.5 mg of verapamil, 200 mcg of nitroglycerine, and 3000 units of heparin was combine with 5 mL of the patient's blood and injected slowly into the sheath over 1 minutes. 5 French Bernstein catheter and 0.035 inch Bentson wire advanced through the 5 French sheath to the level of the distal infrarenal abdominal aorta without difficulty. Aortogram was performed. No significant opacification of uterine arteries was identified. 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 The catheter was retracted to the suprarenal abdominal aorta and aortogram was again performed which showed large hypertrophied bilateral ovarian arteries which supply the uterus. Bilateral renal angiogram was performed in order to identify the origin of the ovarian arteries. Both main renal arteries were patent and did not supply the ovarian arteries. The Bernstein catheter was exchanged for a Ultimate 1 catheter. The left ovarian artery was successfully selected.  Left ovarian angiogram showed brisk flow to the fibroid as well as flow to the lower pole of the left kidney. High-flow Renegade microcatheter was advanced into the proximal left ovarian artery, well beyond the origin of the accessory left lower pole renal artery. This was confirmed by angiogram. Left ovarian artery embolization was performed utilizing 1 vial of 500-700 micron and 1 vial of 700-900 micron embospheres. Post embolization angiogram showed minimal antegrade flow in the left ovarian artery. The accessory artery to the lower pole the left kidney remained patent. Despite prolonged efforts, I was unable to access the right ovarian artery. Celiac angiogram was performed to confirm that it did not originate from the gastroduodenal artery. Repeat aortogram showed the most likely origin near the right  main renal artery, however I was unable to access this origin. During aortogram, there was little antegrade flow in the right ovarian artery, likely due to cross-filling and embolization from the left ovarian artery. Catheter was removed. The radial sheath was clotted. It was exchanged for a new 5 French sheath over 0.021 inch guidewire. The new sheath aspirated and flushed well. Combination of 2.5 mg of verapamil and 200 mcg of nitroglycerin was mixed with 3 mL of saline and combine with 5 mL of patient's blood and injected into the sheath. TR band was applied over the sheath access site and inflated with 15 mL of air. The sheath was removed. The TR band was slowly deflated until the radial pulse could be palpated distal to the access site. Final volume of air in the TR band was 10 mL. Radial pulse was intact. Patient tolerated procedure well. She was admitted for control of pain and nausea. IMPRESSION: Uterine fibroid embolization. Uterus mainly supplied by ovarian arteries. Left ovarian artery successfully embolized utilizing a combination of 1 vial of 500-700 micron and 1 vial of 700-900 micron embospheres.  Unable to access right ovarian artery which likely originates near the ostium of the right main renal artery. Minimal antegrade flow seen in the right ovarian artery on aortogram after embolization of the left ovarian artery. Electronically Signed   By: Miachel Roux M.D.   On: 05/26/2021 16:16    Labs:  CBC: Recent Labs    12/04/20 1507 12/17/20 1136 12/20/20 1322 01/08/21 1037 01/23/21 1138 05/26/21 0843  WBC 8.1 9.7  --   --  10.9* 8.7  HGB 7.2* 7.2* 8.4* 11.9* 10.2* 8.4*  HCT 27.6* 27.1* 30.6* 35.0* 35.9 29.6*  PLT 533* 455*  --   --  505* 450*    COAGS: Recent Labs    05/26/21 0843  INR 1.0    BMP: Recent Labs    12/17/20 1136 01/08/21 1037 05/26/21 0843  NA 138 140 141  K 3.6 3.5 3.6  CL 105 103 110  CO2 22  --  27  GLUCOSE 98 112* 100*  BUN 3* <3* 10  CALCIUM 8.8  --  8.7*  CREATININE 1.02* 0.80 1.00  GFRNONAA 65  --  >60  GFRAA 75  --   --     LIVER FUNCTION TESTS: Recent Labs    12/17/20 1136  BILITOT 0.2  AST 12  ALT 8  ALKPHOS 119  PROT 6.8  ALBUMIN 4.1    Assessment and Plan:  50 yo female with uterine fibroids, s/p bilateral uterine artery embolization with Dr. Dwaine Gale on 05/26/21.   Left radial artery puncture site stable, no s/s of acute bleeding or infection. Left RP 2+.  Patient has not been able to tolerate solid food due to nausea.  Discussed with Dr. Dwaine Gale, patient to stay in the hospital tonight, will discharge tomorrow if stable.  Scheduled PO Ibuprofen 600 mg q 6 hrs with PRN PO oxycodone 5 mg q 6 hrs for breakthrough pain  PRN PO Zofran or Phenergan    Electronically Signed: Tera Mater, PA-C 05/27/2021, 1:24 PM   I spent a total of 35 Minutes at the the patient's bedside AND on the patient's hospital floor or unit, greater than 50% of which was counseling/coordinating care for bilateral uterine artery embolization.

## 2021-05-28 ENCOUNTER — Other Ambulatory Visit: Payer: Self-pay | Admitting: Radiology

## 2021-05-28 DIAGNOSIS — D250 Submucous leiomyoma of uterus: Secondary | ICD-10-CM

## 2021-05-28 MED ORDER — ONDANSETRON HCL 4 MG PO TABS: 4.0000 mg | ORAL_TABLET | Freq: Four times a day (QID) | ORAL | 0 refills | Status: DC | PRN

## 2021-05-28 MED ORDER — DOCUSATE SODIUM 100 MG PO CAPS: 100.0000 mg | ORAL_CAPSULE | Freq: Two times a day (BID) | ORAL | 0 refills | Status: DC

## 2021-05-28 MED ORDER — IBUPROFEN 600 MG PO TABS: 600.0000 mg | ORAL_TABLET | Freq: Three times a day (TID) | ORAL | 0 refills | Status: AC

## 2021-05-28 MED ORDER — OXYCODONE HCL 5 MG PO TABS: 5.0000 mg | ORAL_TABLET | Freq: Four times a day (QID) | ORAL | 0 refills | Status: AC | PRN

## 2021-05-28 NOTE — Discharge Instructions (Signed)
Uterine Artery Embolization for Fibroids, Care After This sheet gives you information about how to care for yourself after your procedure. Your health care provider may also give you more specific instructions. If you have problems or questions, contact your health careprovider. What can I expect after the procedure? After your procedure, it is common to have: Pelvic cramping. You will be given pain medicine. Nausea and vomiting. You may be given medicine to help relieve nausea. Follow these instructions at home: Incision care Follow instructions from your health care provider about how to take care of your incision. Make sure you: Wash your hands with soap and water before you change your bandage (dressing). If soap and water are not available, use hand sanitizer. Change your dressing as told by your health care provider. Check your incision area every day for signs of infection. Check for: More redness, swelling, or pain. More fluid or blood. Warmth. Pus or a bad smell. Medicines  Take over-the-counter and prescription medicines only as told by your health care provider. Do not take aspirin. It can cause bleeding. Do not drive for 24 hours if you were given a medicine to help you relax (sedative). Do not drive or use heavy machinery while taking prescription pain medicine.   General instructions Ask your health care provider when you can resume sexual activity. To prevent or treat constipation while you are taking prescription pain medicine, your health care provider may recommend that you: Drink enough fluid to keep your urine clear or pale yellow. Take over-the-counter or prescription medicines. Eat foods that are high in fiber, such as fresh fruits and vegetables, whole grains, and beans. Limit foods that are high in fat and processed sugars, such as fried and sweet foods. Contact a health care provider if: You have a fever. You have more redness, swelling, or pain around your  incision site. You have more fluid or blood coming from your incision site. Your incision feels warm to the touch. You have pus or a bad smell coming from your incision. You have a rash. You have uncontrolled nausea or you cannot eat or drink anything without vomiting. Get help right away if: You have trouble breathing. You have chest pain. You have severe abdominal pain. You have leg pain. You become dizzy and faint. Summary After your procedure, it is common to have pelvic cramping. You will be given pain medicine. Follow instructions from your health care provider about how to take care of your incision. Check your incision area every day for signs of infection. Take over-the-counter and prescription medicines only as told by your health care provider. This information is not intended to replace advice given to you by your health care provider. Make sure you discuss any questions you have with your healthcare provider.

## 2021-05-28 NOTE — Discharge Summary (Addendum)
Physician Discharge Summary      Patient ID: Jasmin Stephenson MRN: [id number] DOB/AGE: 02/15/1971 50 y.o.  Admit date: 05/26/2021 Discharge date: 05/28/2021  Admission Diagnoses: Active Problems:   Uterine leiomyoma  Discharge Diagnoses:  Active Problems:   Uterine leiomyoma    Procedures: Uterine fibroid embolization  Discharged Condition: good  Hospital Course: Admitted after successful UFE procedure on 6/13. Had some issues with ongoing nausea/vomiting that have gradually improved. Pt has now tolerated diet and been OOB ambulating. Voiding well, no hematuria. Stable for discharge. All medications, instructions, and follow up plans discussed.  Consults: None   Discharge Exam: Blood pressure 131/88, pulse 66, temperature 98.8 F (37.1 C), temperature source Oral, resp. rate 20, last menstrual period 05/16/2021, SpO2 98 %. General: NAD Lungs: CTA without w/r/r Heart: Regular Ext:(L)radial art puncture site soft, NT, no hematoma, strong pulse, hand warm. Abdomen: soft, NT   Disposition: Discharge disposition: 01-Home or Self Care      Discharge Instructions     Activity as tolerated - No restrictions   Complete by: As directed    Call MD for:  persistant nausea and vomiting   Complete by: As directed    Call MD for:  redness, tenderness, or signs of infection (pain, swelling, redness, odor or green/yellow discharge around incision site)   Complete by: As directed    Call MD for:  severe uncontrolled pain   Complete by: As directed    Call MD for:  temperature >100.4   Complete by: As directed    Diet - low sodium heart healthy   Complete by: As directed    Driving Restrictions   Complete by: As directed    Avoid driving while using the stronger pain medications   Increase activity slowly   Complete by: As directed    May shower / Bathe   Complete by: As directed    May walk up steps   Complete by: As directed    No dressing needed   Complete by:  As directed          Follow-up Information     Mir, Paula Libra, MD. Go in 3 week(s).   Specialties: Interventional Radiology, Diagnostic Radiology, Radiology Why: Clinic will call you to schedule follow up visit. You may also call [phone] with questions or concerns Contact information: [address] [place] [address] [phone]                 Signed: Ascencion Dike PA-C 05/28/2021, 12:44 PM

## 2021-05-28 NOTE — Progress Notes (Signed)
Pt feeling a little better today. Less nausea, no vomiting. Has tolerated liquids. Wants to try something more Anxious to get home. Mother at bedside. BP 131/88   Pulse 66   Temp 98.8 F (37.1 C) (Oral)   Resp 20   LMP 05/16/2021   SpO2 98%  (L)wrist radial site soft, NT, excellent pulse Abd: soft, ND, minimal lower tenderness.  Hopefully tolerates some reg diet this am. Encouraged pt to get OOB and ambulate  Anticipate DC later this am or this afternoon.  Ascencion Dike PA-C Interventional Radiology 05/28/2021 9:07 AM

## 2021-05-29 ENCOUNTER — Telehealth: Payer: Self-pay

## 2021-05-29 NOTE — Telephone Encounter (Signed)
Transition Care Management Unsuccessful Follow-up Telephone Call  Date of discharge and from where:  05/28/2021 from [facility]  Attempts:  1st Attempt  Reason for unsuccessful TCM follow-up call:  Unable to leave message

## 2021-05-30 NOTE — Telephone Encounter (Signed)
Transition Care Management Unsuccessful Follow-up Telephone Call  Date of discharge and from where:  05/28/2021 - [facility]  Attempts:  2nd Attempt  Reason for unsuccessful TCM follow-up call:  Unable to leave message

## 2021-06-02 NOTE — Telephone Encounter (Signed)
Transition Care Management Unsuccessful Follow-up Telephone Call  Date of discharge and from where:  05/28/2021 from [facility]  Attempts:  3rd Attempt  Reason for unsuccessful TCM follow-up call:  Unable to reach patient

## 2021-06-04 ENCOUNTER — Encounter: Payer: Self-pay | Admitting: Nurse Practitioner

## 2021-06-04 ENCOUNTER — Other Ambulatory Visit: Payer: Self-pay

## 2021-06-04 ENCOUNTER — Ambulatory Visit (INDEPENDENT_AMBULATORY_CARE_PROVIDER_SITE_OTHER): Payer: Medicaid Other | Admitting: Nurse Practitioner

## 2021-06-04 ENCOUNTER — Ambulatory Visit: Payer: Medicaid Other | Admitting: Family Medicine

## 2021-06-04 VITALS — BP 138/93 | HR 87 | Temp 98.1°F | Ht 65.0 in | Wt 188.0 lb

## 2021-06-04 DIAGNOSIS — R7303 Prediabetes: Secondary | ICD-10-CM | POA: Diagnosis not present

## 2021-06-04 DIAGNOSIS — D649 Anemia, unspecified: Secondary | ICD-10-CM

## 2021-06-04 DIAGNOSIS — Z9889 Other specified postprocedural states: Secondary | ICD-10-CM

## 2021-06-04 NOTE — Progress Notes (Signed)
[place], [place]  [address] Phone:  [phone]   Fax:  [phone]   Established Patient Office Visit  Subjective:  Patient ID: Jasmin Stephenson, female    DOB: February 08, 1971  Age: 50 y.o. MRN: [id number]  CC:  Chief Complaint  Patient presents with   [facility] admission 05/26/2021 - 05/28/2021 uterine fibroids; ferrous sulfate  makes her sick she stopped taking 2-3 months ago.     HPI Jasmin Stephenson presents for hospital follow up. She  has a past medical history of Allergy, Anemia, Anxiety, Arthritis, Asthma, Breast calcification, right, Cervical herniated disc (2017), Depression, Dysmenorrhea (11/2020), Family history of adverse reaction to anesthesia, GERD (gastroesophageal reflux disease), Hyperlipidemia, Menorrhagia (11/2020), Myocardial infarction ([place]) (yrs ago), Neck fracture ([place]), and Vitamin D deficiency.   Patient is in today for hospital follow-up. Recently admitted for uterine fibroids. Hospital course of treatment was from 05/26/2021 to 05/28/2021.  During hospital course she underwent a uterine fibroid embolization.  She reports that she continues to have pain.  Her menstrual cycle is due in the next week or 2.   Past Medical History:  Diagnosis Date   Allergy    Anemia    1 unit   Anxiety    Arthritis    and tendonitis   Asthma    Breast calcification, right    Cervical herniated disc 2017   Depression    Dysmenorrhea 11/2020   Family history of adverse reaction to anesthesia    GERD (gastroesophageal reflux disease)    Hyperlipidemia    Menorrhagia 11/2020   Myocardial infarction [facility]) yrs ago   " Mild" no cardiologist   Neck fracture ([place])    cervical neck fracture   Vitamin D deficiency     Past Surgical History:  Procedure Laterality Date   ABDOMINAL SURGERY     APPENDECTOMY  yrs ago   [place] N/A 09/21/2016   Procedure: CERVICAL SIX- CERVICAL SEVEN  ARTHROPLASTY;  Surgeon: Kevan Ny Ditty, MD;  Location: [facility];  Service: Neurosurgery;  Laterality: N/A;  C6-7 Arthroplasty   CHOLECYSTECTOMY N/A 10/10/2019   Procedure: LAPAROSCOPIC CHOLECYSTECTOMY WITH INTRAOPERATIVE CHOLANGIOGRAM;  Surgeon: Donnie Mesa, MD;  Location: [facility];  Service: General;  Laterality: N/A;   FOOT SURGERY Left    corrections on foot   IR ANGIOGRAM SELECTIVE EACH ADDITIONAL VESSEL  05/26/2021   IR ANGIOGRAM VISCERAL SELECTIVE  05/26/2021   IR EMBO TUMOR ORGAN ISCHEMIA INFARCT INC GUIDE ROADMAPPING  05/26/2021   IR RADIOLOGIST EVAL & MGMT  03/19/2021   IR RENAL BILAT S&I MOD SED  05/26/2021   IR US GUIDE VASC ACCESS LEFT  05/26/2021   IRRIGATION AND DEBRIDEMENT ABSCESS Right 09/10/2014   Procedure: IRRIGATION AND DEBRIDEMENT RIGHT BREAST  ABSCESS;  Surgeon: Gayland Curry, MD;  Location: [facility];  Service: General;  Laterality: Right;   SYNDACTYLIZATION Right 01/08/2021   Procedure: SYNDACTYLIZATION;  Surgeon: Evelina Bucy, DPM;  Location: [facility];  Service: Podiatry;  Laterality: Right;   TUBAL LIGATION     per pt    WISDOM TOOTH EXTRACTION      Family History  Problem Relation Age of Onset   Diabetes Father    CAD Father    Breast cancer Sister 44   Alcohol abuse Neg Hx    Arthritis Neg Hx    Asthma Neg Hx    Birth  defects Neg Hx    Cancer Neg Hx    COPD Neg Hx    Depression Neg Hx    Drug abuse Neg Hx    Early death Neg Hx    Hearing loss Neg Hx    Heart disease Neg Hx    Hyperlipidemia Neg Hx    Hypertension Neg Hx    Kidney disease Neg Hx    Learning disabilities Neg Hx    Mental illness Neg Hx    Mental retardation Neg Hx    Miscarriages / Stillbirths Neg Hx    Stroke Neg Hx    Vision loss Neg Hx     Social History   Socioeconomic History   Marital status: Single    Spouse name: Not on file   Number of children: 3   Years of education: Not on file   Highest education level: Not on file  Occupational History   Not  on file  Tobacco Use   Smoking status: Every Day    Packs/day: 2.00    Years: 10.00    Pack years: 20.00    Types: Cigarettes   Smokeless tobacco: Never  Vaping Use   Vaping Use: Never used  Substance and Sexual Activity   Alcohol use: Yes    Comment: occ   Drug use: Not Currently    Types: Marijuana   Sexual activity: Yes    Birth control/protection: Surgical  Other Topics Concern   Not on file  Social History Narrative   Not on file   Social Determinants of Health   Financial Resource Strain: Not on file  Food Insecurity: Not on file  Transportation Needs: Not on file  Physical Activity: Not on file  Stress: Not on file  Social Connections: Not on file  Intimate Partner Violence: Not on file    Outpatient Medications Prior to Visit  Medication Sig Dispense Refill   albuterol (PROVENTIL) (2.5 MG/3ML) 0.083% nebulizer solution Take 3 mLs (2.5 mg total) by nebulization every 6 (six) hours as needed for wheezing or shortness of breath. 75 mL 12   albuterol (VENTOLIN HFA) 108 (90 Base) MCG/ACT inhaler Inhale 2 puffs into the lungs every 4 (four) hours as needed for wheezing or shortness of breath (cough, shortness of breath or wheezing.). 1 each 11   aspirin EC 81 MG tablet Take 1 tablet (81 mg total) by mouth daily. 150 tablet 2   CALCIUM ASCORBATE PO Take 10 mg by mouth daily.     cilostazol (PLETAL) 100 MG tablet Take 1 tablet (100 mg total) by mouth 2 (two) times daily before a meal. 60 tablet 11   clobetasol ointment (TEMOVATE) 0.05 % Apply to the affected areas of the scalp 3-4 times weekly     diphenhydrAMINE (BENADRYL) 50 MG tablet Take 1 tablet (50 mg total) by mouth at bedtime as needed for itching. 30 tablet 2   docusate sodium (COLACE) 100 MG capsule Take 1 capsule (100 mg total) by mouth 2 (two) times daily. 10 capsule 0   Fluocinolone Acetonide Body 0.01 % OIL      fluticasone (FLONASE) 50 MCG/ACT nasal spray Place 2 sprays into both nostrils daily. 16 g 6    ibuprofen (ADVIL) 600 MG tablet Take 1 tablet (600 mg total) by mouth 3 (three) times daily for 10 days. 30 tablet 0   minoxidil (LONITEN) 2.5 MG tablet Take 1.25 mg by mouth at bedtime.     ondansetron (ZOFRAN) 4 MG tablet Take 1 tablet (  4 mg total) by mouth every 6 (six) hours as needed for nausea or vomiting. 30 tablet 0   oxyCODONE (OXY IR/ROXICODONE) 5 MG immediate release tablet Take 1 tablet (5 mg total) by mouth every 6 (six) hours as needed for up to 7 days for moderate pain, severe pain or breakthrough pain. 28 tablet 0   rosuvastatin (CRESTOR) 10 MG tablet Take 1 tablet (10 mg total) by mouth daily. 30 tablet 10   triamcinolone (KENALOG) 0.1 % Apply 1 application topically 2 (two) times daily. 30 g 1   Vitamin D, Ergocalciferol, (DRISDOL) 1.25 MG (50000 UNIT) CAPS capsule Take 1 capsule (50,000 Units total) by mouth every 7 (seven) days. 5 capsule 6   acetaminophen (TYLENOL) 500 MG tablet Take 500 mg by mouth every 6 (six) hours as needed. (Patient not taking: Reported on 06/04/2021)     ciclopirox (PENLAC) 8 % solution Apply topically at bedtime. Apply over nail and surrounding skin. Apply daily over previous coat. Remove weekly with file or polish remover. 6.6 mL 0   DERMA-SMOOTHE/FS SCALP 0.01 % OIL Apply topically.     ferrous sulfate 324 (65 Fe) MG TBEC TAKE 1 TABLET (324 MG TOTAL) BY MOUTH IN THE MORNING AND AT BEDTIME. (Patient not taking: Reported on 06/04/2021) 180 tablet 3   No facility-administered medications prior to visit.    Allergies  Allergen Reactions   Penicillins Nausea And Vomiting and Rash    Has patient had a PCN reaction causing immediate rash, facial/tongue/throat swelling, SOB or lightheadedness with hypotension: Yes Has patient had a PCN reaction causing severe rash involving mucus membranes or skin necrosis: Yes Has patient had a PCN reaction that required hospitalization No Has patient had a PCN reaction occurring within the last 10 years: No If all of the  above answers are "NO", then may proceed with Cephalosporin use.    ROS Review of Systems  Constitutional: Negative.   HENT: Negative.    Eyes: Negative.   Respiratory: Negative.    Gastrointestinal: Negative.   Endocrine: Negative.   Skin: Negative.   Allergic/Immunologic: Negative.   Neurological: Negative.   Hematological: Negative.      Objective:    Physical Exam Constitutional:      General: She is not in acute distress. HENT:     Head: Normocephalic and atraumatic.     Nose: Nose normal.     Mouth/Throat:     Mouth: Mucous membranes are moist.  Cardiovascular:     Rate and Rhythm: Normal rate and regular rhythm.     Pulses: Normal pulses.     Heart sounds: Normal heart sounds.  Pulmonary:     Effort: Pulmonary effort is normal.     Breath sounds: Normal breath sounds.  Abdominal:     Palpations: Abdomen is soft.  Musculoskeletal:        General: Normal range of motion.     Cervical back: Normal range of motion.  Skin:    General: Skin is warm and dry.     Capillary Refill: Capillary refill takes less than 2 seconds.     Comments: Right elbow dark discoloration  Neurological:     General: No focal deficit present.     Mental Status: She is alert and oriented to person, place, and time.    BP (!) 138/93   Pulse 87   Temp 98.1 F (36.7 C)   Ht 5\' 5"  (1.651 m)   Wt 188 lb (85.3 kg)   LMP 05/16/2021  SpO2 100%   BMI 31.28 kg/m  Wt Readings from Last 3 Encounters:  06/04/21 188 lb (85.3 kg)  03/13/21 195 lb 6.4 oz (88.6 kg)  02/25/21 191 lb (86.6 kg)     There are no preventive care reminders to display for this patient.   There are no preventive care reminders to display for this patient.  Lab Results  Component Value Date   TSH 2.740 03/06/2019   Lab Results  Component Value Date   WBC 8.7 05/26/2021   HGB 8.4 (L) 05/26/2021   HCT 29.6 (L) 05/26/2021   MCV 74.4 (L) 05/26/2021   PLT 450 (H) 05/26/2021   Lab Results  Component Value  Date   NA 141 05/26/2021   K 3.6 05/26/2021   CO2 27 05/26/2021   GLUCOSE 100 (H) 05/26/2021   BUN 10 05/26/2021   CREATININE 1.00 05/26/2021   BILITOT 0.2 12/17/2020   ALKPHOS 119 12/17/2020   AST 12 12/17/2020   ALT 8 12/17/2020   PROT 6.8 12/17/2020   ALBUMIN 4.1 12/17/2020   CALCIUM 8.7 (L) 05/26/2021   ANIONGAP 4 (L) 05/26/2021   Lab Results  Component Value Date   CHOL 133 12/04/2020   Lab Results  Component Value Date   HDL 34 (L) 12/04/2020   Lab Results  Component Value Date   LDLCALC 78 12/04/2020   Lab Results  Component Value Date   TRIG 113 12/04/2020   Lab Results  Component Value Date   CHOLHDL 3.9 12/04/2020   Lab Results  Component Value Date   HGBA1C 5.8 (H) 12/04/2020      Assessment & Plan:   Problem List Items Addressed This Visit   None Visit Diagnoses     Anemia, unspecified type    -  Primary Labs pending in the next 2 weeks for reevaluation   Relevant Orders   CBC with Differential/Platelet   Iron, TIBC and Ferritin Panel   Status post embolization of uterine artery     Follow-up as directed   Prediabetes     Consider home glucose monitoring Weight loss at least 5% of current body weight is can be achieved with lifestyle modification dietary changes and regular daily exercise Encourage blood pressure control goal <120/80 and maintaining total cholesterol <200 Follow-up every 3 to 6 months for reevaluation Education material provided        No orders of the defined types were placed in this encounter.   Follow-up: Return in about 6 months (around 12/04/2021) for lab only 2 weeks and then 6 mon fu [id number].    Vevelyn Francois, NP

## 2021-06-04 NOTE — Patient Instructions (Signed)
[employer]  Iron Deficiency Anemia, Adult Iron deficiency anemia is when you do not have enough red blood cells or hemoglobin in your blood. This happens because you have too little iron in your body. Hemoglobin carries oxygen to parts of the body. Anemia can cause yourbody to not get enough oxygen. What are the causes? Not eating enough foods that have iron in them. The body not being able to take in iron well. Needing more iron due to pregnancy or heavy menstrual periods, for females. Cancer. Bleeding in the bowels. Many blood draws. What increases the risk? Being pregnant. Being a teenage girl going through a growth spurt. What are the signs or symptoms? Pale skin, lips, and nails. Weakness, dizziness, and getting tired easily. Headache. Feeling like you cannot breathe well when moving (shortness of breath). Cold hands and feet. Fast heartbeat or a heartbeat that is not regular. Feeling grouchy (irritable) or breathing fast. These are more common in very bad anemia. Mild anemia may not cause any symptoms. How is this treated? This condition is treated by finding out why you do not have enough iron and then getting more iron. It may include: Adding foods to your diet that have a lot of iron. Taking iron pills (supplements). If you are pregnant or breastfeeding, you may need to take extra iron. Your diet often does not provide the amount of iron that you need. Getting more vitamin C in your diet. Vitamin C helps your body take in iron. You may need to take iron pills with a glass of orange juice or vitamin C pills. Medicines to make heavy menstrual periods lighter. Surgery. You may need blood tests to see if treatment is working. If the treatment doesnot seem to be working, you may need more tests. Follow these instructions at home: Medicines Take over-the-counter and prescription medicines only as told by your doctor. This includes iron pills and vitamins. Take iron pills  when your stomach is empty. If you cannot handle this, take them with food. Do not drink milk or take antacids at the same time as your iron pills. Iron pills may turn your poop (stool)black. If you cannot handle taking iron pills by mouth, ask your doctor about getting iron through: An IV tube. A shot (injection) into a muscle. Eating and drinking  Talk with your doctor before changing the foods you eat. He or she may tell you to eat foods that have a lot of iron, such as: Liver. Low-fat (lean) beef. Breads and cereals that have iron added to them. Eggs. Dried fruit. Dark green, leafy vegetables. Eat fresh fruits and vegetables that are high in vitamin C. They help your body use iron. Foods with a lot of vitamin C include: Oranges. Peppers. Tomatoes. Mangoes. Drink enough fluid to keep your pee (urine) pale yellow.  Managing constipation If you are taking iron pills, they may cause trouble pooping (constipation). To prevent or treat trouble pooping, you may need to: Take over-the-counter or prescription medicines. Eat foods that are high in fiber. These include beans, whole grains, and fresh fruits and vegetables. Limit foods that are high in fat and sugar. These include fried or sweet foods. General instructions Return to your normal activities as told by your doctor. Ask your doctor what activities are safe for you. Keep yourself clean, and keep things clean around you. Keep all follow-up visits as told by your doctor. This is important. Contact a doctor if: You feel like you may vomit (nauseous), or  you vomit. You feel weak. You are sweating for no reason. You have trouble pooping, such as: Pooping less than 3 times a week. Straining to poop. Having poop that is hard, dry, or larger than normal. Feeling full or bloated. Pain in the lower belly. Not feeling better after pooping. Get help right away if: You pass out (faint). You have chest pain. You have trouble  breathing that: Is very bad. Gets worse with physical activity. You have a fast heartbeat, or a heartbeat that does not feel regular. You get light-headed when getting up from sitting or lying down. These symptoms may be an emergency. Do not wait to see if the symptoms will go away. Get medical help right away. Call your local emergency services (911 in the [place] Do not drive yourself to the hospital. Summary Iron deficiency anemia is when you have too little iron in your body. This condition is treated by finding out why you do not have enough iron in your body and then getting more iron. Take over-the-counter and prescription medicines only as told by your doctor. Eat fresh fruits and vegetables that are high in vitamin C. Get help right away if you cannot breathe well. This information is not intended to replace advice given to you by your health care provider. Make sure you discuss any questions you have with your healthcare provider. Document Revised: 08/08/2019 Document Reviewed: 08/08/2019 Elsevier Patient Education  [place].

## 2021-06-10 ENCOUNTER — Other Ambulatory Visit: Payer: Self-pay | Admitting: Interventional Radiology

## 2021-06-10 ENCOUNTER — Encounter: Payer: Self-pay | Admitting: *Deleted

## 2021-06-10 ENCOUNTER — Other Ambulatory Visit: Payer: Self-pay

## 2021-06-10 ENCOUNTER — Other Ambulatory Visit (HOSPITAL_COMMUNITY): Payer: Self-pay | Admitting: Student

## 2021-06-10 ENCOUNTER — Ambulatory Visit
Admission: RE | Admit: 2021-06-10 | Discharge: 2021-06-10 | Disposition: A | Discharge status: Home / Self Care | Payer: Medicaid Other | Source: Ambulatory Visit | Attending: Radiology | Admitting: Radiology

## 2021-06-10 DIAGNOSIS — D259 Leiomyoma of uterus, unspecified: Secondary | ICD-10-CM

## 2021-06-10 DIAGNOSIS — D250 Submucous leiomyoma of uterus: Secondary | ICD-10-CM

## 2021-06-10 HISTORY — PX: IR RADIOLOGIST EVAL & MGMT: IMG5224

## 2021-06-10 MED ORDER — IBUPROFEN 200 MG PO TABS: 600.0000 mg | ORAL_TABLET | Freq: Four times a day (QID) | ORAL | 0 refills | Status: DC

## 2021-06-10 MED ORDER — OXYCODONE HCL 5 MG PO TABS: 5.0000 mg | ORAL_TABLET | Freq: Four times a day (QID) | ORAL | 0 refills | Status: AC | PRN

## 2021-06-10 NOTE — Progress Notes (Signed)
Chief Complaint: Patient was consulted remotely today (TeleHealth) for follow up s/p UFE  Referring Physician(s): Bruning,Kevin  History of Present Illness: Jasmin Stephenson is a 50 y.o. female with past medical history of hyperlipidemia, MI, asthma, GERD, anemia, arthritis, depression, anxiety, and status post uterine fibroid embolization on 05/26/2021.  She was discharged on 05/28/2021.  She reports continued right abdominal and pelvic pain which is at 7/10 at peak intensity.  Her pain does improve with OxyContin and ibuprofen.  She continues to experience some nausea despite Zofran.  She did have moderate vaginal bleeding during her period this past week.  She denies dysuria, abnormal vaginal discharge, fever, and chills.  Past Medical History:  Diagnosis Date   Allergy    Anemia    1 unit   Anxiety    Arthritis    and tendonitis   Asthma    Breast calcification, right    Cervical herniated disc 2017   Depression    Dysmenorrhea 11/2020   Family history of adverse reaction to anesthesia    GERD (gastroesophageal reflux disease)    Hyperlipidemia    Menorrhagia 11/2020   Myocardial infarction [facility]) yrs ago   " Mild" no cardiologist   Neck fracture ([place])    cervical neck fracture   Vitamin D deficiency     Past Surgical History:  Procedure Laterality Date   ABDOMINAL SURGERY     APPENDECTOMY  yrs ago   [place] N/A 09/21/2016   Procedure: CERVICAL SIX- CERVICAL SEVEN ARTHROPLASTY;  Surgeon: Kevan Ny Ditty, MD;  Location: [place];  Service: Neurosurgery;  Laterality: N/A;  C6-7 Arthroplasty   CHOLECYSTECTOMY N/A 10/10/2019   Procedure: LAPAROSCOPIC CHOLECYSTECTOMY WITH INTRAOPERATIVE CHOLANGIOGRAM;  Surgeon: Donnie Mesa, MD;  Location: [place];  Service: General;  Laterality: N/A;   FOOT SURGERY Left    corrections on foot   IR ANGIOGRAM SELECTIVE EACH ADDITIONAL VESSEL  05/26/2021   IR ANGIOGRAM VISCERAL SELECTIVE   05/26/2021   IR EMBO TUMOR ORGAN ISCHEMIA INFARCT INC GUIDE ROADMAPPING  05/26/2021   IR RADIOLOGIST EVAL & MGMT  03/19/2021   IR RADIOLOGIST EVAL & MGMT  06/10/2021   IR RENAL BILAT S&I MOD SED  05/26/2021   IR US GUIDE VASC ACCESS LEFT  05/26/2021   IRRIGATION AND DEBRIDEMENT ABSCESS Right 09/10/2014   Procedure: IRRIGATION AND DEBRIDEMENT RIGHT BREAST  ABSCESS;  Surgeon: Gayland Curry, MD;  Location: [facility];  Service: General;  Laterality: Right;   SYNDACTYLIZATION Right 01/08/2021   Procedure: SYNDACTYLIZATION;  Surgeon: Evelina Bucy, DPM;  Location: [place];  Service: Podiatry;  Laterality: Right;   TUBAL LIGATION     per pt    WISDOM TOOTH EXTRACTION      Allergies: Penicillins  Medications: Prior to Admission medications   Medication Sig Start Date End Date Taking? Authorizing Provider  acetaminophen (TYLENOL) 500 MG tablet Take 500 mg by mouth every 6 (six) hours as needed. Patient not taking: Reported on 06/04/2021    [provider]  albuterol (PROVENTIL) (2.5 MG/3ML) 0.083% nebulizer solution Take 3 mLs (2.5 mg total) by nebulization every 6 (six) hours as needed for wheezing or shortness of breath. 12/04/20   [name], FNP  albuterol (VENTOLIN HFA) 108 (90 Base) MCG/ACT inhaler Inhale 2 puffs into the lungs every 4 (four) hours as needed for wheezing or shortness of breath (cough, shortness of breath or wheezing.). 12/04/20  [name], FNP  aspirin EC 81 MG tablet Take 1 tablet (81 mg total) by mouth daily. 07/15/20   Serafina Mitchell, MD  CALCIUM ASCORBATE PO Take 10 mg by mouth daily.    [provider]  ciclopirox (PENLAC) 8 % solution Apply topically at bedtime. Apply over nail and surrounding skin. Apply daily over previous coat. Remove weekly with file or polish remover. 02/11/21   Evelina Bucy, DPM  cilostazol (PLETAL) 100 MG tablet Take 1 tablet (100 mg total) by mouth 2 (two) times daily before a meal. 07/15/20   Serafina Mitchell, MD  clobetasol ointment (TEMOVATE) 0.05 % Apply to the affected areas of the scalp 3-4 times weekly 02/05/20   [provider]  DERMA-SMOOTHE/FS SCALP 0.01 % OIL Apply topically. 02/21/21   [provider]  diphenhydrAMINE (BENADRYL) 50 MG tablet Take 1 tablet (50 mg total) by mouth at bedtime as needed for itching. 01/03/21   [name], FNP  docusate sodium (COLACE) 100 MG capsule Take 1 capsule (100 mg total) by mouth 2 (two) times daily. 05/28/21   Ascencion Dike, PA-C  ferrous sulfate 324 (65 Fe) MG TBEC TAKE 1 TABLET (324 MG TOTAL) BY MOUTH IN THE MORNING AND AT BEDTIME. Patient not taking: Reported on 06/04/2021 01/06/21   [name], FNP  Fluocinolone Acetonide Body 0.01 % OIL  11/06/19   [provider]  fluticasone (FLONASE) 50 MCG/ACT nasal spray Place 2 sprays into both nostrils daily. 03/06/19   [name], FNP  ibuprofen (ADVIL) 200 MG tablet Take 3 tablets (600 mg total) by mouth every 6 (six) hours for 14 days. 06/10/21 06/24/21  Han, Aimee H, PA-C  minoxidil (LONITEN) 2.5 MG tablet Take 1.25 mg by mouth at bedtime. 02/17/21   [provider]  ondansetron (ZOFRAN) 4 MG tablet Take 1 tablet (4 mg total) by mouth every 6 (six) hours as needed for nausea or vomiting. 05/28/21   Ascencion Dike, PA-C  oxyCODONE (ROXICODONE) 5 MG immediate release tablet Take 1 tablet (5 mg total) by mouth every 6 (six) hours as needed for up to 14 days for severe pain or moderate pain. 06/10/21 06/24/21  Han, Aimee H, PA-C  rosuvastatin (CRESTOR) 10 MG tablet Take 1 tablet (10 mg total) by mouth daily. 07/15/20   Serafina Mitchell, MD  triamcinolone (KENALOG) 0.1 % Apply 1 application topically 2 (two) times daily. 01/03/21   [name], FNP  Vitamin D, Ergocalciferol, (DRISDOL) 1.25 MG (50000 UNIT) CAPS capsule Take 1 capsule (50,000 Units total) by mouth every 7 (seven) days. 12/13/20   [name], FNP     Family History  Problem Relation  Age of Onset   Diabetes Father    CAD Father    Breast cancer Sister 68   Alcohol abuse Neg Hx    Arthritis Neg Hx    Asthma Neg Hx    Birth defects Neg Hx    Cancer Neg Hx    COPD Neg Hx    Depression Neg Hx    Drug abuse Neg Hx    Early death Neg Hx    Hearing loss Neg Hx    Heart disease Neg Hx    Hyperlipidemia Neg Hx    Hypertension Neg Hx    Kidney disease Neg Hx    Learning disabilities Neg Hx    Mental illness Neg Hx    Mental retardation Neg Hx    Miscarriages / [place]  Neg Hx    Stroke Neg Hx    Vision loss Neg Hx     Social History   Socioeconomic History   Marital status: Single    Spouse name: Not on file   Number of children: 3   Years of education: Not on file   Highest education level: Not on file  Occupational History   Not on file  Tobacco Use   Smoking status: Every Day    Packs/day: 2.00    Years: 10.00    Pack years: 20.00    Types: Cigarettes   Smokeless tobacco: Never  Vaping Use   Vaping Use: Never used  Substance and Sexual Activity   Alcohol use: Yes    Comment: occ   Drug use: Not Currently    Types: Marijuana   Sexual activity: Yes    Birth control/protection: Surgical  Other Topics Concern   Not on file  Social History Narrative   Not on file   Social Determinants of Health   Financial Resource Strain: Not on file  Food Insecurity: Not on file  Transportation Needs: Not on file  Physical Activity: Not on file  Stress: Not on file  Social Connections: Not on file   Review of Systems  Review of Systems: A 12 point ROS discussed and pertinent positives are indicated in the HPI above.  All other systems are negative.  Physical Exam No direct physical exam was performed.   Vital Signs: LMP 05/16/2021   Imaging: Labs:  CBC: Recent Labs    12/04/20 1507 12/17/20 1136 12/20/20 1322 01/08/21 1037 01/23/21 1138 05/26/21 0843  WBC 8.1 9.7  --   --  10.9* 8.7  HGB 7.2* 7.2* 8.4* 11.9* 10.2* 8.4*  HCT 27.6*  27.1* 30.6* 35.0* 35.9 29.6*  PLT 533* 455*  --   --  505* 450*    COAGS: Recent Labs    05/26/21 0843  INR 1.0    BMP: Recent Labs    12/17/20 1136 01/08/21 1037 05/26/21 0843  NA 138 140 141  K 3.6 3.5 3.6  CL 105 103 110  CO2 22  --  27  GLUCOSE 98 112* 100*  BUN 3* <3* 10  CALCIUM 8.8  --  8.7*  CREATININE 1.02* 0.80 1.00  GFRNONAA 65  --  >60  GFRAA 75  --   --     LIVER FUNCTION TESTS: Recent Labs    12/17/20 1136  BILITOT 0.2  AST 12  ALT 8  ALKPHOS 119  PROT 6.8  ALBUMIN 4.1    TUMOR MARKERS: No results for input(s): AFPTM, CEA, CA199, CHROMGRNA in the last 8760 hours.  Assessment and Plan:  50 year old woman approximately 2 weeks status post uterine fibroid embolization.  She was seen today in IR clinic throughout tele visit for follow-up.  She continues to have 7/10 right abdominal and pelvic pain.  Her pain is controlled with OxyContin and ibuprofen.  She also has mild nausea despite Zofran.   We will call in a new prescription for OxyContin given that she is almost out of this medication.  I asked her to try to transition from OxyContin to Tylenol as a believe it is responsible for her nausea.  We will follow-up with her with tele visit on 07/01/2021 and if her pain does not improve, we will consider performing either a pelvic ultrasound or abdominal/pelvic CT.  Thank you for this interesting consult.  I greatly enjoyed meeting GRACIELLA ARMENT and look forward  to participating in their care.  A copy of this report was sent to the requesting provider on this date.  Electronically Signed: Paula Libra Octave [place] 06/10/2021, 3:28 PM   I spent a total of    15 Minutes in remote  clinical consultation, greater than 50% of which was counseling/coordinating care for UFE.    Visit type: Audio only (telephone). Audio (no video) only due to patient's lack of internet/smartphone capability.  Alternative for in-person consultation at [facility], [place]  [address], [place], [place]. This visit type was conducted due to national recommendations for restrictions regarding the COVID-19 Pandemic (e.g. social distancing).  This format is felt to be most appropriate for this patient at this time.  All issues noted in this document were discussed and addressed.

## 2021-06-13 ENCOUNTER — Telehealth: Payer: Self-pay | Admitting: Student

## 2021-06-13 NOTE — Telephone Encounter (Signed)
Multiple attempts made to refill oxycodone for one more week. I spoke with the pharmacist at [facility] and was told that her insurance requires "prior authorization" to approve narcotics for longer than one week. The patient was given a week of oxycodone at discharge post-UFE but continues to have post-procedural pain. A pre-authorization sheet was faxed to me and I was required to call a phone number and speak with a representative to give the required information regarding the necessity of additional pain medication. I was given a "reference number" and told that an approval or denial would be faxed to me (the [facility] RN station fax machine) within 23 hours.   Dr. Dwaine Gale notified. I called the patient's home phone and left a voice mail message with information that we were still trying to get her prescription refilled. I left the number to the APP office for her to call us back with any questions.   The pre-authorization form with the reference number written on it is in the APP office at [facility].  IR will continue to follow up.  Jasmin Stephenson, [place] [phone] 06/13/2021, 2:15 PM

## 2021-06-17 ENCOUNTER — Telehealth: Payer: Self-pay | Admitting: Student

## 2021-06-17 NOTE — Telephone Encounter (Signed)
Insurance authorization received with approval to refill Oxycodone 5 mg x 28 pills. This paperwork is in the APP office at [facility] if needed.  This [facility] into the [facility] on [address] and the pharmacists said the request went through and the refill will be ready this afternoon. I called the patient and left her a voicemail on her home phone. Her cell phone number has no option to leave a VM.   Soyla Dryer, [place] [phone] 06/17/2021, 10:30 AM

## 2021-06-18 ENCOUNTER — Other Ambulatory Visit: Payer: Medicaid Other

## 2021-06-20 ENCOUNTER — Other Ambulatory Visit: Payer: Self-pay

## 2021-06-20 ENCOUNTER — Ambulatory Visit (INDEPENDENT_AMBULATORY_CARE_PROVIDER_SITE_OTHER): Payer: Medicaid Other | Admitting: Nurse Practitioner

## 2021-06-20 ENCOUNTER — Other Ambulatory Visit: Payer: Medicaid Other

## 2021-06-20 ENCOUNTER — Encounter: Payer: Self-pay | Admitting: Nurse Practitioner

## 2021-06-20 VITALS — BP 119/87 | HR 75 | Temp 97.5°F | Ht 65.0 in | Wt 185.0 lb

## 2021-06-20 DIAGNOSIS — D649 Anemia, unspecified: Secondary | ICD-10-CM | POA: Diagnosis not present

## 2021-06-20 DIAGNOSIS — H1031 Unspecified acute conjunctivitis, right eye: Secondary | ICD-10-CM

## 2021-06-20 MED ORDER — POLYMYXIN B-TRIMETHOPRIM 10000-0.1 UNIT/ML-% OP SOLN: 2.0000 [drp] | OPHTHALMIC | 0 refills | Status: DC

## 2021-06-20 NOTE — Addendum Note (Signed)
Addended by: Thressa Sheller on: 06/20/2021 10:00 AM   Modules accepted: Orders

## 2021-06-20 NOTE — Patient Instructions (Signed)
Bacterial Conjunctivitis, Adult Bacterial conjunctivitis is an infection of your conjunctiva. This is the clear membrane that covers the white part of your eye and the inner part of your eyelid. This infection can make your eye: Red or pink. Itchy. This condition spreads easily from person to person (is contagious) and from one eye to the other eye. What are the causes? This condition is caused by germs (bacteria). You may get the infection if you come into close contact with: A person who has the infection. Items that have germs on them (are contaminated), such as face towels, contact lens solution, or eye makeup. What increases the risk? You are more likely to get this condition if you: Have contact with people who have the infection. Wear contact lenses. Have a sinus infection. Have had a recent eye injury or surgery. Have a weak body defense system (immune system). Have dry eyes. What are the signs or symptoms?  Thick, yellowish discharge from the eye. Tearing or watery eyes. Itchy eyes. Burning feeling in your eyes. Eye redness. Swollen eyelids. Blurred vision. How is this treated?  Antibiotic eye drops or ointment. Antibiotic medicine taken by mouth. This is used for infections that do not get better with drops or ointment or that last more than 10 days. Cool, wet cloths placed on the eyes. Artificial tears used 2-6 times a day. Follow these instructions at home: Medicines Take or apply your antibiotic medicine as told by your doctor. Do not stop taking or applying the antibiotic even if you start to feel better. Take or apply over-the-counter and prescription medicines only as told by your doctor. Do not touch your eyelid with the eye-drop bottle or the ointment tube. Managing discomfort Wipe any fluid from your eye with a warm, wet washcloth or a cotton ball. Place a clean, cool, wet cloth on your eye. Do this for 10-20 minutes, 3-4 times per day. General  instructions Do not wear contacts until the infection is gone. Wear glasses until your doctor says it is okay to wear contacts again. Do not wear eye makeup until the infection is gone. Throw away old eye makeup. Change or wash your pillowcase every day. Do not share towels or washcloths. Wash your hands often with soap and water. Use paper towels to dry your hands. Do not touch or rub your eyes. Do not drive or use heavy machinery if your vision is blurred. Contact a doctor if: You have a fever. You do not get better after 10 days. Get help right away if: You have a fever and your symptoms get worse all of a sudden. You have very bad pain when you move your eye. Your face: Hurts. Is red. Is swollen. You have sudden loss of vision. Summary Bacterial conjunctivitis is an infection of your conjunctiva. This infection spreads easily from person to person. Wash your hands often with soap and water. Use paper towels to dry your hands. Take or apply your antibiotic medicine as told by your doctor. Contact a doctor if you have a fever or you do not get better after 10 days. This information is not intended to replace advice given to you by your health care provider. Make sure you discuss any questions you have with your healthcare provider. Document Revised: 10/22/2020 Document Reviewed: 07/06/2018 Elsevier Patient Education  [place].

## 2021-06-20 NOTE — Progress Notes (Signed)
[place], [place]  [address] Phone:  [phone]   Fax:  [phone]   Acute Office Visit  Subjective:    Patient ID: Jasmin Stephenson, female    DOB: Aug 26, 1971, 50 y.o.   MRN: [id number]  Chief Complaint  Patient presents with   Acute Visit    Right eyelid swelling, pain and itchy started 2 days ago. Has been using an eye wash with no relief.     HPI Patient is in today for an acute visit; addon. She  has a past medical history of Allergy, Anemia, Anxiety, Arthritis, Asthma, Breast calcification, right, Cervical herniated disc (2017), Depression, Dysmenorrhea (11/2020), Family history of adverse reaction to anesthesia, GERD (gastroesophageal reflux disease), Hyperlipidemia, Menorrhagia (11/2020), Myocardial infarction ([place]) (yrs ago), Neck fracture ([place]), and Vitamin D deficiency.   Conjunctivitis Patient presents for evaluation of blurred vision, discharge, erythema, foreign body sensation, itching, pain, photophobia, and tearing in the left eye. She has noticed the above symptoms for 2 days.  Onset was acute. Patient denies visual field deficit. There is a history of allergies and contact lens use. She reports that she need glasses. She reports that she has been using a eye wash . She in not familiar with the name.  Past Medical History:  Diagnosis Date   Allergy    Anemia    1 unit   Anxiety    Arthritis    and tendonitis   Asthma    Breast calcification, right    Cervical herniated disc 2017   Depression    Dysmenorrhea 11/2020   Family history of adverse reaction to anesthesia    GERD (gastroesophageal reflux disease)    Hyperlipidemia    Menorrhagia 11/2020   Myocardial infarction [facility]) yrs ago   " Mild" no cardiologist   Neck fracture ([place])    cervical neck fracture   Vitamin D deficiency     Past Surgical History:  Procedure Laterality Date   ABDOMINAL SURGERY     APPENDECTOMY  yrs ago   [place] N/A 09/21/2016   Procedure: CERVICAL SIX- CERVICAL SEVEN ARTHROPLASTY;  Surgeon: Kevan Ny Ditty, MD;  Location: [place];  Service: Neurosurgery;  Laterality: N/A;  C6-7 Arthroplasty   CHOLECYSTECTOMY N/A 10/10/2019   Procedure: LAPAROSCOPIC CHOLECYSTECTOMY WITH INTRAOPERATIVE CHOLANGIOGRAM;  Surgeon: Donnie Mesa, MD;  Location: [place];  Service: General;  Laterality: N/A;   FOOT SURGERY Left    corrections on foot   IR ANGIOGRAM SELECTIVE EACH ADDITIONAL VESSEL  05/26/2021   IR ANGIOGRAM VISCERAL SELECTIVE  05/26/2021   IR EMBO TUMOR ORGAN ISCHEMIA INFARCT INC GUIDE ROADMAPPING  05/26/2021   IR RADIOLOGIST EVAL & MGMT  03/19/2021   IR RADIOLOGIST EVAL & MGMT  06/10/2021   IR RENAL BILAT S&I MOD SED  05/26/2021   IR US GUIDE VASC ACCESS LEFT  05/26/2021   IRRIGATION AND DEBRIDEMENT ABSCESS Right 09/10/2014   Procedure: IRRIGATION AND DEBRIDEMENT RIGHT BREAST  ABSCESS;  Surgeon: Gayland Curry, MD;  [facility];  Service: General;  Laterality: Right;   SYNDACTYLIZATION Right 01/08/2021   Procedure: SYNDACTYLIZATION;  Surgeon: Evelina Bucy, DPM;  Location: [place];  Service: Podiatry;  Laterality: Right;   TUBAL LIGATION     per pt    WISDOM TOOTH EXTRACTION      Family History  Problem Relation Age of Onset   Diabetes Father  CAD Father    Breast cancer Sister 20   Alcohol abuse Neg Hx    Arthritis Neg Hx    Asthma Neg Hx    Birth defects Neg Hx    Cancer Neg Hx    COPD Neg Hx    Depression Neg Hx    Drug abuse Neg Hx    Early death Neg Hx    Hearing loss Neg Hx    Heart disease Neg Hx    Hyperlipidemia Neg Hx    Hypertension Neg Hx    Kidney disease Neg Hx    Learning disabilities Neg Hx    Mental illness Neg Hx    Mental retardation Neg Hx    Miscarriages / Stillbirths Neg Hx    Stroke Neg Hx    Vision loss Neg Hx     Social History   Socioeconomic History   Marital status: Single    Spouse name: Not on file    Number of children: 3   Years of education: Not on file   Highest education level: Not on file  Occupational History   Not on file  Tobacco Use   Smoking status: Every Day    Packs/day: 2.00    Years: 10.00    Pack years: 20.00    Types: Cigarettes   Smokeless tobacco: Never  Vaping Use   Vaping Use: Never used  Substance and Sexual Activity   Alcohol use: Yes    Comment: occ   Drug use: Not Currently    Types: Marijuana   Sexual activity: Yes    Birth control/protection: Surgical  Other Topics Concern   Not on file  Social History Narrative   Not on file   Social Determinants of Health   Financial Resource Strain: Not on file  Food Insecurity: Not on file  Transportation Needs: Not on file  Physical Activity: Not on file  Stress: Not on file  Social Connections: Not on file  Intimate Partner Violence: Not on file    Outpatient Medications Prior to Visit  Medication Sig Dispense Refill   acetaminophen (TYLENOL) 500 MG tablet Take 500 mg by mouth every 6 (six) hours as needed.     albuterol (PROVENTIL) (2.5 MG/3ML) 0.083% nebulizer solution Take 3 mLs (2.5 mg total) by nebulization every 6 (six) hours as needed for wheezing or shortness of breath. 75 mL 12   albuterol (VENTOLIN HFA) 108 (90 Base) MCG/ACT inhaler Inhale 2 puffs into the lungs every 4 (four) hours as needed for wheezing or shortness of breath (cough, shortness of breath or wheezing.). 1 each 11   aspirin EC 81 MG tablet Take 1 tablet (81 mg total) by mouth daily. 150 tablet 2   CALCIUM ASCORBATE PO Take 10 mg by mouth daily.     ciclopirox (PENLAC) 8 % solution Apply topically at bedtime. Apply over nail and surrounding skin. Apply daily over previous coat. Remove weekly with file or polish remover. 6.6 mL 0   cilostazol (PLETAL) 100 MG tablet Take 1 tablet (100 mg total) by mouth 2 (two) times daily before a meal. 60 tablet 11   clobetasol ointment (TEMOVATE) 0.05 % Apply to the affected areas of the scalp  3-4 times weekly     DERMA-SMOOTHE/FS SCALP 0.01 % OIL Apply topically.     diphenhydrAMINE (BENADRYL) 50 MG tablet Take 1 tablet (50 mg total) by mouth at bedtime as needed for itching. 30 tablet 2   docusate sodium (COLACE) 100 MG capsule Take  1 capsule (100 mg total) by mouth 2 (two) times daily. 10 capsule 0   ferrous sulfate 324 (65 Fe) MG TBEC TAKE 1 TABLET (324 MG TOTAL) BY MOUTH IN THE MORNING AND AT BEDTIME. (Patient not taking: Reported on 06/04/2021) 180 tablet 3   Fluocinolone Acetonide Body 0.01 % OIL      fluticasone (FLONASE) 50 MCG/ACT nasal spray Place 2 sprays into both nostrils daily. 16 g 6   ibuprofen (ADVIL) 600 MG tablet Take 600 mg by mouth every 6 (six) hours.     minoxidil (LONITEN) 2.5 MG tablet Take 1.25 mg by mouth at bedtime.     ondansetron (ZOFRAN) 4 MG tablet Take 1 tablet (4 mg total) by mouth every 6 (six) hours as needed for nausea or vomiting. 30 tablet 0   oxyCODONE (ROXICODONE) 5 MG immediate release tablet Take 1 tablet (5 mg total) by mouth every 6 (six) hours as needed for up to 14 days for severe pain or moderate pain. 30 tablet 0   rosuvastatin (CRESTOR) 10 MG tablet Take 1 tablet (10 mg total) by mouth daily. 30 tablet 10   triamcinolone (KENALOG) 0.1 % Apply 1 application topically 2 (two) times daily. 30 g 1   Vitamin D, Ergocalciferol, (DRISDOL) 1.25 MG (50000 UNIT) CAPS capsule Take 1 capsule (50,000 Units total) by mouth every 7 (seven) days. 5 capsule 6   ibuprofen (ADVIL) 200 MG tablet Take 3 tablets (600 mg total) by mouth every 6 (six) hours for 14 days. 168 tablet 0   No facility-administered medications prior to visit.    Allergies  Allergen Reactions   Penicillins Nausea And Vomiting and Rash    Has patient had a PCN reaction causing immediate rash, facial/tongue/throat swelling, SOB or lightheadedness with hypotension: Yes Has patient had a PCN reaction causing severe rash involving mucus membranes or skin necrosis: Yes Has patient had  a PCN reaction that required hospitalization No Has patient had a PCN reaction occurring within the last 10 years: No If all of the above answers are "NO", then may proceed with Cephalosporin use.    Review of Systems     Objective:    Physical Exam Constitutional:      General: She is not in acute distress.    Appearance: She is not ill-appearing, toxic-appearing or diaphoretic.  HENT:     Head: Normocephalic and atraumatic.  Eyes:     Extraocular Movements: Extraocular movements intact.     Conjunctiva/sclera:     Right eye: Right conjunctiva is injected.     Pupils: Pupils are equal, round, and reactive to light.     Comments: Swelling to right eye lid   Neurological:     Mental Status: She is alert.    BP 119/87   Pulse 75   Temp (!) 97.5 F (36.4 C)   Ht 5\' 5"  (1.651 m)   Wt 185 lb 0.6 oz (83.9 kg)   BMI 30.79 kg/m  Wt Readings from Last 3 Encounters:  06/20/21 185 lb 0.6 oz (83.9 kg)  06/04/21 188 lb (85.3 kg)  03/13/21 195 lb 6.4 oz (88.6 kg)    Health Maintenance Due  Topic Date Due   COVID-19 Vaccine (1) Never done    There are no preventive care reminders to display for this patient.   Lab Results  Component Value Date   TSH 2.740 03/06/2019   Lab Results  Component Value Date   WBC 7.3 06/20/2021   HGB 8.7 (L) 06/20/2021  HCT 30.1 (L) 06/20/2021   MCV 73 (L) 06/20/2021   PLT 566 (H) 06/20/2021   Lab Results  Component Value Date   NA 141 05/26/2021   K 3.6 05/26/2021   CO2 27 05/26/2021   GLUCOSE 100 (H) 05/26/2021   BUN 10 05/26/2021   CREATININE 1.00 05/26/2021   BILITOT 0.2 12/17/2020   ALKPHOS 119 12/17/2020   AST 12 12/17/2020   ALT 8 12/17/2020   PROT 6.8 12/17/2020   ALBUMIN 4.1 12/17/2020   CALCIUM 8.7 (L) 05/26/2021   ANIONGAP 4 (L) 05/26/2021   Lab Results  Component Value Date   CHOL 133 12/04/2020   Lab Results  Component Value Date   HDL 34 (L) 12/04/2020   Lab Results  Component Value Date   LDLCALC 78  12/04/2020   Lab Results  Component Value Date   TRIG 113 12/04/2020   Lab Results  Component Value Date   CHOLHDL 3.9 12/04/2020   Lab Results  Component Value Date   HGBA1C 5.8 (H) 12/04/2020       Assessment & Plan:   Problem List Items Addressed This Visit   None Visit Diagnoses     Acute bacterial conjunctivitis of right eye    -  Primary   Relevant Medications   trimethoprim-polymyxin b (POLYTRIM) ophthalmic solution Will continue to monitor closely        Meds ordered this encounter  Medications   trimethoprim-polymyxin b (POLYTRIM) ophthalmic solution    Sig: Place 2 drops into the right eye every 4 (four) hours.    Dispense:  10 mL    Refill:  0    Order Specific Question:   Supervising Provider    Answer:   Tresa Garter [[id number]]     Vevelyn Francois, NP

## 2021-06-21 LAB — CBC WITH DIFFERENTIAL/PLATELET
Basophils Absolute: 0.1 10*3/uL (ref 0.0–0.2)
Basos: 1 %
EOS (ABSOLUTE): 0.2 10*3/uL (ref 0.0–0.4)
Eos: 2 %
Hematocrit: 30.1 % — ABNORMAL LOW (ref 34.0–46.6)
Hemoglobin: 8.7 g/dL — ABNORMAL LOW (ref 11.1–15.9)
Immature Grans (Abs): 0 10*3/uL (ref 0.0–0.1)
Immature Granulocytes: 0 %
Lymphocytes Absolute: 2.2 10*3/uL (ref 0.7–3.1)
Lymphs: 30 %
MCH: 21 pg — ABNORMAL LOW (ref 26.6–33.0)
MCHC: 28.9 g/dL — ABNORMAL LOW (ref 31.5–35.7)
MCV: 73 fL — ABNORMAL LOW (ref 79–97)
Monocytes Absolute: 0.7 10*3/uL (ref 0.1–0.9)
Monocytes: 10 %
Neutrophils Absolute: 4.2 10*3/uL (ref 1.4–7.0)
Neutrophils: 57 %
Platelets: 566 10*3/uL — ABNORMAL HIGH (ref 150–450)
RBC: 4.14 x10E6/uL (ref 3.77–5.28)
RDW: 18 % — ABNORMAL HIGH (ref 11.7–15.4)
WBC: 7.3 10*3/uL (ref 3.4–10.8)

## 2021-06-21 LAB — IRON,TIBC AND FERRITIN PANEL
Ferritin: 11 ng/mL — ABNORMAL LOW (ref 15–150)
Iron Saturation: 3 % — CL (ref 15–55)
Iron: 11 ug/dL — ABNORMAL LOW (ref 27–159)
Total Iron Binding Capacity: 417 ug/dL (ref 250–450)
UIBC: 406 ug/dL (ref 131–425)

## 2021-06-24 ENCOUNTER — Telehealth: Payer: Self-pay

## 2021-06-24 NOTE — Telephone Encounter (Signed)
-----   Message from Vevelyn Francois, NP sent at 06/23/2021 12:31 PM EDT ----- Please make Jasmin Stephenson aware that her hemoglobin hematocrit has improved slightly Her iron saturation is low.  Has she been taking her iron as prescribed.  If so then she may want to consider an iron transfusion.  If not we do encourage her to take this as directed separately with improvement her overall anemia. Please let me know thanks  Please see how her right eye is doing thanks

## 2021-06-24 NOTE — Telephone Encounter (Signed)
Attempted to contact patient. Voicemail is not set up

## 2021-07-01 ENCOUNTER — Other Ambulatory Visit: Payer: Self-pay

## 2021-07-01 ENCOUNTER — Ambulatory Visit
Admission: RE | Admit: 2021-07-01 | Discharge: 2021-07-01 | Disposition: A | Discharge status: Home / Self Care | Payer: Medicaid Other | Source: Ambulatory Visit | Attending: Interventional Radiology | Admitting: Interventional Radiology

## 2021-07-01 DIAGNOSIS — D259 Leiomyoma of uterus, unspecified: Secondary | ICD-10-CM

## 2021-10-16 DIAGNOSIS — L669 Cicatricial alopecia, unspecified: Secondary | ICD-10-CM | POA: Diagnosis not present

## 2021-10-16 DIAGNOSIS — L299 Pruritus, unspecified: Secondary | ICD-10-CM | POA: Diagnosis not present

## 2021-10-16 DIAGNOSIS — L658 Other specified nonscarring hair loss: Secondary | ICD-10-CM | POA: Diagnosis not present

## 2021-10-21 ENCOUNTER — Other Ambulatory Visit: Payer: Self-pay

## 2021-10-21 ENCOUNTER — Other Ambulatory Visit: Payer: Self-pay | Admitting: Surgery

## 2021-10-21 MED ORDER — ROSUVASTATIN CALCIUM 10 MG PO TABS
10.0000 mg | ORAL_TABLET | Freq: Every day | ORAL | 0 refills | Status: DC
Start: 2021-10-21 — End: 2022-03-18

## 2021-10-21 NOTE — Telephone Encounter (Signed)
Please make appt for more refills/90 day supply

## 2021-12-04 ENCOUNTER — Ambulatory Visit: Payer: Medicaid Other | Admitting: Nurse Practitioner

## 2021-12-24 ENCOUNTER — Other Ambulatory Visit: Payer: Self-pay | Admitting: Interventional Radiology

## 2021-12-24 DIAGNOSIS — D250 Submucous leiomyoma of uterus: Secondary | ICD-10-CM

## 2022-01-02 ENCOUNTER — Ambulatory Visit (HOSPITAL_COMMUNITY)
Admission: RE | Admit: 2022-01-02 | Discharge: 2022-01-02 | Disposition: A | Discharge status: Home / Self Care | Payer: Medicaid Other | Source: Ambulatory Visit | Attending: Interventional Radiology | Admitting: Interventional Radiology

## 2022-01-02 ENCOUNTER — Other Ambulatory Visit: Payer: Self-pay

## 2022-01-02 DIAGNOSIS — D250 Submucous leiomyoma of uterus: Secondary | ICD-10-CM

## 2022-01-02 DIAGNOSIS — D259 Leiomyoma of uterus, unspecified: Secondary | ICD-10-CM | POA: Diagnosis not present

## 2022-01-02 MED ORDER — GADOBUTROL 1 MMOL/ML IV SOLN
8.0000 mL | Freq: Once | INTRAVENOUS | Status: AC | PRN
  Administered 2022-01-02: 8 mL via INTRAVENOUS

## 2022-01-05 ENCOUNTER — Other Ambulatory Visit: Payer: Self-pay

## 2022-01-05 ENCOUNTER — Ambulatory Visit
Admission: RE | Admit: 2022-01-05 | Discharge: 2022-01-05 | Disposition: A | Discharge status: Home / Self Care | Payer: Medicaid Other | Source: Ambulatory Visit | Attending: Interventional Radiology | Admitting: Interventional Radiology

## 2022-01-05 ENCOUNTER — Encounter: Payer: Self-pay | Admitting: *Deleted

## 2022-01-05 DIAGNOSIS — D259 Leiomyoma of uterus, unspecified: Secondary | ICD-10-CM | POA: Diagnosis not present

## 2022-01-05 DIAGNOSIS — Z9889 Other specified postprocedural states: Secondary | ICD-10-CM | POA: Diagnosis not present

## 2022-01-05 DIAGNOSIS — D250 Submucous leiomyoma of uterus: Secondary | ICD-10-CM

## 2022-01-05 HISTORY — PX: IR RADIOLOGIST EVAL & MGMT: IMG5224

## 2022-01-05 NOTE — Progress Notes (Signed)
Chief Complaint: Uterine Fibroid Embolization  Referring Physician(s): Bass,Lawrence A   History of Present Illness: Jasmin Stephenson is a 51 y.o. female, who joined [place] today by telemedicine, for follow up s/p uterine artery embolization for symptomatic fibroids.  We confirmed her identity with 2 identifiers.   She underwent UFE on 05/26/2021.  Her bleeding has decreased mildly since the embolization.  She continues to have intermittent pressure on her bladder which is relieved by urination.   She also continues to have significant cramping during her period.  Past Medical History:  Diagnosis Date   Allergy    Anemia    1 unit   Anxiety    Arthritis    and tendonitis   Asthma    Breast calcification, right    Cervical herniated disc 2017   Depression    Dysmenorrhea 11/2020   Family history of adverse reaction to anesthesia    GERD (gastroesophageal reflux disease)    Hyperlipidemia    Menorrhagia 11/2020   Myocardial infarction [facility]) yrs ago   " Mild" no cardiologist   Neck fracture ([place])    cervical neck fracture   Vitamin D deficiency     Past Surgical History:  Procedure Laterality Date   ABDOMINAL SURGERY     APPENDECTOMY  yrs ago   [place] N/A 09/21/2016   Procedure: CERVICAL SIX- CERVICAL SEVEN ARTHROPLASTY;  Surgeon: Kevan Ny Ditty, MD;  Location: [place];  Service: Neurosurgery;  Laterality: N/A;  C6-7 Arthroplasty   CHOLECYSTECTOMY N/A 10/10/2019   Procedure: LAPAROSCOPIC CHOLECYSTECTOMY WITH INTRAOPERATIVE CHOLANGIOGRAM;  Surgeon: Donnie Mesa, MD;  Location: [place];  Service: General;  Laterality: N/A;   FOOT SURGERY Left    corrections on foot   IR ANGIOGRAM SELECTIVE EACH ADDITIONAL VESSEL  05/26/2021   IR ANGIOGRAM VISCERAL SELECTIVE  05/26/2021   IR EMBO TUMOR ORGAN ISCHEMIA INFARCT INC GUIDE ROADMAPPING  05/26/2021   IR RADIOLOGIST EVAL & MGMT  03/19/2021   IR RADIOLOGIST EVAL & MGMT  06/10/2021   IR  RADIOLOGIST EVAL & MGMT  01/05/2022   IR RENAL BILAT S&I MOD SED  05/26/2021   IR US GUIDE VASC ACCESS LEFT  05/26/2021   IRRIGATION AND DEBRIDEMENT ABSCESS Right 09/10/2014   Procedure: IRRIGATION AND DEBRIDEMENT RIGHT BREAST  ABSCESS;  Surgeon: Gayland Curry, MD;  [facility];  Service: General;  Laterality: Right;   SYNDACTYLIZATION Right 01/08/2021   Procedure: SYNDACTYLIZATION;  Surgeon: Evelina Bucy, DPM;  Location: [place];  Service: Podiatry;  Laterality: Right;   TUBAL LIGATION     per pt    WISDOM TOOTH EXTRACTION      Allergies: Penicillins  Medications: Prior to Admission medications   Medication Sig Start Date End Date Taking? Authorizing Provider  acetaminophen (TYLENOL) 500 MG tablet Take 500 mg by mouth every 6 (six) hours as needed.    [provider]  albuterol (PROVENTIL) (2.5 MG/3ML) 0.083% nebulizer solution Take 3 mLs (2.5 mg total) by nebulization every 6 (six) hours as needed for wheezing or shortness of breath. 12/04/20   [name], FNP  albuterol (VENTOLIN HFA) 108 (90 Base) MCG/ACT inhaler Inhale 2 puffs into the lungs every 4 (four) hours as needed for wheezing or shortness of breath (cough, shortness of breath or wheezing.). 12/04/20   [name], FNP  aspirin EC 81 MG tablet Take 1 tablet (81 mg total) by mouth daily. 07/15/20  Serafina Mitchell, MD  CALCIUM ASCORBATE PO Take 10 mg by mouth daily.    [provider]  ciclopirox (PENLAC) 8 % solution Apply topically at bedtime. Apply over nail and surrounding skin. Apply daily over previous coat. Remove weekly with file or polish remover. 02/11/21   Evelina Bucy, DPM  cilostazol (PLETAL) 100 MG tablet Take 1 tablet (100 mg total) by mouth 2 (two) times daily before a meal. 07/15/20   Serafina Mitchell, MD  clobetasol ointment (TEMOVATE) 0.05 % Apply to the affected areas of the scalp 3-4 times weekly 02/05/20   [provider]  DERMA-SMOOTHE/FS SCALP  0.01 % OIL Apply topically. 02/21/21   [provider]  diphenhydrAMINE (BENADRYL) 50 MG tablet Take 1 tablet (50 mg total) by mouth at bedtime as needed for itching. 01/03/21   [name], FNP  docusate sodium (COLACE) 100 MG capsule Take 1 capsule (100 mg total) by mouth 2 (two) times daily. 05/28/21   Ascencion Dike, PA-C  ferrous sulfate 324 (65 Fe) MG TBEC TAKE 1 TABLET (324 MG TOTAL) BY MOUTH IN THE MORNING AND AT BEDTIME. Patient not taking: Reported on 06/04/2021 01/06/21   [name], FNP  Fluocinolone Acetonide Body 0.01 % OIL  11/06/19   [provider]  fluticasone (FLONASE) 50 MCG/ACT nasal spray Place 2 sprays into both nostrils daily. 03/06/19   [name], FNP  ibuprofen (ADVIL) 600 MG tablet Take 600 mg by mouth every 6 (six) hours. 06/10/21   [provider]  minoxidil (LONITEN) 2.5 MG tablet Take 1.25 mg by mouth at bedtime. 02/17/21   [provider]  ondansetron (ZOFRAN) 4 MG tablet Take 1 tablet (4 mg total) by mouth every 6 (six) hours as needed for nausea or vomiting. 05/28/21   Ascencion Dike, PA-C  rosuvastatin (CRESTOR) 10 MG tablet Take 1 tablet (10 mg total) by mouth daily. 07/15/20   Serafina Mitchell, MD  rosuvastatin (CRESTOR) 10 MG tablet Take 1 tablet (10 mg total) by mouth daily. 10/21/21   Serafina Mitchell, MD  triamcinolone (KENALOG) 0.1 % Apply 1 application topically 2 (two) times daily. 01/03/21   [name], FNP  trimethoprim-polymyxin b (POLYTRIM) ophthalmic solution Place 2 drops into the right eye every 4 (four) hours. 06/20/21   Vevelyn Francois, NP  Vitamin D, Ergocalciferol, (DRISDOL) 1.25 MG (50000 UNIT) CAPS capsule Take 1 capsule (50,000 Units total) by mouth every 7 (seven) days. 12/13/20   [name], FNP     Family History  Problem Relation Age of Onset   Diabetes Father    CAD Father    Breast cancer Sister 63   Alcohol abuse Neg Hx    Arthritis Neg Hx    Asthma Neg Hx    Birth defects  Neg Hx    Cancer Neg Hx    COPD Neg Hx    Depression Neg Hx    Drug abuse Neg Hx    Early death Neg Hx    Hearing loss Neg Hx    Heart disease Neg Hx    Hyperlipidemia Neg Hx    Hypertension Neg Hx    Kidney disease Neg Hx    Learning disabilities Neg Hx    Mental illness Neg Hx    Mental retardation Neg Hx    Miscarriages / Stillbirths Neg Hx    Stroke Neg Hx    Vision loss Neg Hx     Social History  Socioeconomic History   Marital status: Single    Spouse name: Not on file   Number of children: 3   Years of education: Not on file   Highest education level: Not on file  Occupational History   Not on file  Tobacco Use   Smoking status: Every Day    Packs/day: 2.00    Years: 10.00    Pack years: 20.00    Types: Cigarettes   Smokeless tobacco: Never  Vaping Use   Vaping Use: Never used  Substance and Sexual Activity   Alcohol use: Yes    Comment: occ   Drug use: Not Currently    Types: Marijuana   Sexual activity: Yes    Birth control/protection: Surgical  Other Topics Concern   Not on file  Social History Narrative   Not on file   Social Determinants of Health   Financial Resource Strain: Not on file  Food Insecurity: Not on file  Transportation Needs: Not on file  Physical Activity: Not on file  Stress: Not on file  Social Connections: Not on file    Review of Systems  Review of Systems: A 12 point ROS discussed and pertinent positives are indicated in the HPI above.  All other systems are negative.  Physical Exam No direct physical exam was performed    Vital Signs: There were no vitals taken for this visit.  Imaging: MR PELVIS W WO CONTRAST  Result Date: 01/03/2022 CLINICAL DATA:  Follow-up uterine artery embolization. EXAM: MRI PELVIS WITHOUT AND WITH CONTRAST TECHNIQUE: Multiplanar multisequence MR imaging of the pelvis was performed both before and after administration of intravenous contrast. CONTRAST:  26mL GADAVIST GADOBUTROL 1 MMOL/ML  IV SOLN COMPARISON:  MRI 04/07/2021 FINDINGS: Urinary Tract:  The bladder is under. No bladder mass or calculi. Bowel: The rectum, sigmoid colon and visualized small bowel loops are unremarkable. Vascular/Lymphatic: The major vascular structures are normal. No pelvic or inguinal adenopathy. Reproductive: The uterus measures 11.5 x 7.1 x 7.1 cm. Uterine volume is 290 cc. The uterus previously measured 13.9 x 7.8 x 8.0 cm with estimated volume of 469 cc. The dominant fibroid in the anterior fundal region measures 4.8 x 4.0 x 4.9 cm. Volume is 47.5 cc. This previously measured 5.5 x 4.7 x 5.3 cm with volume of 68.5 cc. There is mild mass effect on the endometrium but the junctional zone appears preserved without discrete submucosal component. The fibroid demonstrates moderate diffuse enhancement after contrast administration. Normal endometrium. Both ovaries are unremarkable. No adnexal masses. Other:  No pelvic mass or free pelvic fluid collections. Musculoskeletal: No significant bony findings. IMPRESSION: 1. Interval decrease in size of the uterus and the dominant fibroid as detailed above. Persistent moderate diffuse enhancement of the fibroid after contrast administration. 2. Normal ovaries. 3. No pelvic mass or free pelvic fluid collections. Electronically Signed   By: Marijo Sanes M.D.   On: 01/03/2022 15:04   IR Radiologist Eval & Mgmt  Result Date: 01/05/2022 Please refer to notes tab for details about interventional procedure. (Op Note)   Labs:  CBC: Recent Labs    01/08/21 1037 01/23/21 1138 05/26/21 0843 06/20/21 1005  WBC  --  10.9* 8.7 7.3  HGB 11.9* 10.2* 8.4* 8.7*  HCT 35.0* 35.9 29.6* 30.1*  PLT  --  505* 450* 566*    COAGS: Recent Labs    05/26/21 0843  INR 1.0    BMP: Recent Labs    01/08/21 1037 05/26/21 0843  NA 140 141  K 3.5 3.6  CL 103 110  CO2  --  27  GLUCOSE 112* 100*  BUN <3* 10  CALCIUM  --  8.7*  CREATININE 0.80 1.00  GFRNONAA  --  >60    LIVER  FUNCTION TESTS: No results for input(s): BILITOT, AST, ALT, ALKPHOS, PROT, ALBUMIN in the last 8760 hours.  TUMOR MARKERS: No results for input(s): AFPTM, CEA, CA199, CHROMGRNA in the last 8760 hours.  Assessment and Plan:  51 year old woman status post uterine fibroid embolization on 05/26/2021.  Her bleeding has mildly improved, however she still has significant mass effect on her bladder as well as cramping.    During uterine fibroid embolization, it was noted that she had an anatomic variation where her uterus was supplied predominantly through ovarian arteries.  No true uterine arteries were identified originating from the internal iliac arteries.  The left ovarian artery was successfully accessed and embolization was performed.  Despite my best efforts, I was not able to catheterize the right ovarian artery.  I do not believe a repeat attempt to access the right ovarian artery will be successful.    At this time a repeat uterine fibroid embolization would not be beneficial for the patient.  I recommended to her that if her symptoms are still significant, she should consider surgical options.    All her questions were answered.   Thank you for this interesting consult.  I greatly enjoyed meeting Jasmin Stephenson and look forward to participating in their care.  A copy of this report was sent to the requesting provider on this date.  Electronically Signed: Paula Libra Tukker Byrns 01/05/2022, 2:24 PM   I spent a total of    15 Minutes in remote  clinical consultation, greater than 50% of which was counseling/coordinating care for UFE.    Visit type: Audio only (telephone). Audio (no video) only due to audio being sufficient for this visit. Alternative for in-person consultation at [facility], [address] [place], [place], [place]. This visit type was conducted due to national recommendations for restrictions regarding the COVID-19 Pandemic (e.g. social distancing).  This format is felt to be  most appropriate for this patient at this time.  All issues noted in this document were discussed and addressed.

## 2022-01-19 ENCOUNTER — Other Ambulatory Visit: Payer: Self-pay

## 2022-01-19 ENCOUNTER — Ambulatory Visit: Payer: Medicaid Other | Admitting: Podiatry

## 2022-01-23 ENCOUNTER — Other Ambulatory Visit: Payer: Self-pay | Admitting: Surgery

## 2022-01-23 DIAGNOSIS — G8929 Other chronic pain: Secondary | ICD-10-CM

## 2022-01-23 DIAGNOSIS — R1033 Periumbilical pain: Secondary | ICD-10-CM | POA: Diagnosis not present

## 2022-01-27 ENCOUNTER — Encounter: Payer: Self-pay | Admitting: Podiatry

## 2022-01-27 ENCOUNTER — Other Ambulatory Visit: Payer: Self-pay

## 2022-01-27 ENCOUNTER — Ambulatory Visit: Payer: Medicaid Other | Admitting: Podiatry

## 2022-01-27 ENCOUNTER — Ambulatory Visit (INDEPENDENT_AMBULATORY_CARE_PROVIDER_SITE_OTHER): Payer: Medicaid Other

## 2022-01-27 DIAGNOSIS — M2041 Other hammer toe(s) (acquired), right foot: Secondary | ICD-10-CM

## 2022-01-27 DIAGNOSIS — M79674 Pain in right toe(s): Secondary | ICD-10-CM | POA: Diagnosis not present

## 2022-01-27 NOTE — Progress Notes (Signed)
°  Subjective:  Patient ID: Jasmin Stephenson, female    DOB: 1971/11/07,  MRN: [id number]  Chief Complaint  Patient presents with   Routine Post Op    I was here last week and they had made an appointment in [place] and I told them I was not going to [place] and the place in between the 4th and 5th on the right is better and has dried up     51 y.o. female presents with the above complaint. History confirmed with patient. States that the area between the toes tore open again and she had a piece of skin that fell out. Thinks there was pus at the time but no pus or anything since.  Objective:  Physical Exam: warm, good capillary refill, no trophic changes or ulcerative lesions, normal DP and PT pulses, and normal sensory exam. Right 4th/5th toes hyperkeratosis over area of previous syndactyly. No skin bridging noted. No open wounds upon debridement.   Radiographs: X-ray of the right foot: no fracture, dislocation, swelling or degenerative changes noted - no interval changes. Assessment:   1. Hammer toe of right foot   2. Pain in toe of right foot      Plan:  Patient was evaluated and treated and all questions answered.  [place] -Debrided areas of previous syndactyly. It appears the skin bridge either ripped again or never fully healed. Likely tobacco use could be impeding healing of this area. No pain at these areas at present. Will hold off repair. -Debrided calluses medial hallux per patient request.  Other calluses trimmed courtesy  Return in about 4 weeks (around 02/24/2022) for [place] f/u.

## 2022-02-19 ENCOUNTER — Encounter: Payer: Self-pay | Admitting: Podiatry

## 2022-02-24 ENCOUNTER — Ambulatory Visit: Payer: Medicaid Other | Admitting: Podiatry

## 2022-02-25 ENCOUNTER — Ambulatory Visit
Admission: RE | Admit: 2022-02-25 | Discharge: 2022-02-25 | Disposition: A | Discharge status: Home / Self Care | Payer: Medicaid Other | Source: Ambulatory Visit | Attending: Surgery | Admitting: Surgery

## 2022-02-25 ENCOUNTER — Other Ambulatory Visit: Payer: Self-pay

## 2022-02-25 DIAGNOSIS — D259 Leiomyoma of uterus, unspecified: Secondary | ICD-10-CM | POA: Diagnosis not present

## 2022-02-25 DIAGNOSIS — G8929 Other chronic pain: Secondary | ICD-10-CM

## 2022-02-25 DIAGNOSIS — R1033 Periumbilical pain: Secondary | ICD-10-CM | POA: Diagnosis not present

## 2022-02-25 DIAGNOSIS — Z9049 Acquired absence of other specified parts of digestive tract: Secondary | ICD-10-CM | POA: Diagnosis not present

## 2022-02-25 DIAGNOSIS — I700 Atherosclerosis of aorta: Secondary | ICD-10-CM | POA: Diagnosis not present

## 2022-02-25 MED ORDER — IOPAMIDOL (ISOVUE-300) INJECTION 61%
100.0000 mL | Freq: Once | INTRAVENOUS | Status: AC | PRN
  Administered 2022-02-25: 100 mL via INTRAVENOUS

## 2022-02-27 NOTE — Progress Notes (Signed)
Please call the patient and let them know that their CT scan did not show any hernia or bowel obstruction.  She does have a large fibroid in her uterus that seems to be larger.  She may want to talk to her GYN about that to see if that could be causing her pain. ? ?Imogene Burn. Lizann Edelman, MD, FACS ?[facility]  ?General Surgery ? ? ?02/27/2022 ?9:28 AM ? ?

## 2022-03-11 ENCOUNTER — Ambulatory Visit: Payer: Medicaid Other | Admitting: Obstetrics and Gynecology

## 2022-03-11 ENCOUNTER — Other Ambulatory Visit: Payer: Self-pay

## 2022-03-11 VITALS — BP 156/96 | HR 83 | Ht 65.0 in | Wt 176.8 lb

## 2022-03-11 DIAGNOSIS — N920 Excessive and frequent menstruation with regular cycle: Secondary | ICD-10-CM

## 2022-03-11 DIAGNOSIS — D251 Intramural leiomyoma of uterus: Secondary | ICD-10-CM | POA: Diagnosis not present

## 2022-03-11 DIAGNOSIS — D252 Subserosal leiomyoma of uterus: Secondary | ICD-10-CM | POA: Diagnosis not present

## 2022-03-11 NOTE — Progress Notes (Signed)
? ?  Subjective:  ? ? Patient ID: Jasmin Stephenson, female    DOB: [birth year], 51 y.o.   MRN: [id number] ? ?HPI ?Pt seen for follow up after UFE.  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  Pt notes continued mass effect on her bladder as well as minimal improvement of her menstrual bleeding.  She has some pelvic pain which she also attributes to the fibroid/uterus as well.  Pt desires definitive treatment with hysterectomy. ? ? ?Review of Systems ? ?   ?Objective:  ? Physical Exam ?Vitals:  ? 03/11/22 1104  ?BP: (!) 156/96  ?Pulse: 83  ? ?CLINICAL DATA:  Follow-up uterine artery embolization. ?  ?EXAM: ?MRI PELVIS WITHOUT AND WITH CONTRAST ?  ?TECHNIQUE: ?Multiplanar multisequence MR imaging of the pelvis was performed ?both before and after administration of intravenous contrast. ?  ?CONTRAST:  32m GADAVIST GADOBUTROL 1 MMOL/ML IV SOLN ?  ?COMPARISON:  MRI 04/07/2021 ?  ?FINDINGS: ?Urinary Tract:  The bladder is under. No bladder mass or calculi. ?  ?Bowel: The rectum, sigmoid colon and visualized small bowel loops ?are unremarkable. ?  ?Vascular/Lymphatic: The major vascular structures are normal. No ?pelvic or inguinal adenopathy. ?  ?Reproductive: The uterus measures 11.5 x 7.1 x 7.1 cm. Uterine ?volume is 290 cc. The uterus previously measured 13.9 x 7.8 x 8.0 cm ?with estimated volume of 469 cc. ?  ?The dominant fibroid in the anterior fundal region measures 4.8 x ?4.0 x 4.9 cm. Volume is 47.5 cc. This previously measured 5.5 x 4.7 ?x 5.3 cm with volume of 68.5 cc. There is mild mass effect on the ?endometrium but the junctional zone appears preserved without ?discrete submucosal component. The fibroid demonstrates moderate ?diffuse enhancement after contrast administration. ?  ?Normal endometrium. Both ovaries are unremarkable. No adnexal ?masses. ?  ?Other:  No pelvic mass or free pelvic fluid collections. ?  ?Musculoskeletal: No  significant bony findings. ?  ?IMPRESSION: ?1. Interval decrease in size of the uterus and the dominant fibroid ?as detailed above. Persistent moderate diffuse enhancement of the ?fibroid after contrast administration. ?2. Normal ovaries. ?3. No pelvic mass or free pelvic fluid collections. ?  ? ? ?   ?Assessment & Plan:  ? ?1. Intramural and subserous leiomyoma of uterus ?Inadequate treatment with UFE.  Pt desires definitive treatment with hysterectomy.  Pt discussed with Dr. ERip Harbour  Pt is candidate for TVH even with enlarged fibroid.  [facility]  Pt will need medical clearance from cardiology and possible anesthesia consult prior to surgery due to history of myocardial infarction ? ?2. Menorrhagia with regular cycle ? ?I spent 10 minutes dedicated to the care of this patient including previsit review of records, face to face time with the patient discussing treatment options and post visit testing.  ? ? ?LGriffin Basil MD ?Faculty Attending, [facility] ?

## 2022-03-11 NOTE — Progress Notes (Signed)
Pt states that she is having significant pain since her procedure last year.   Pt got a call from IR and was told to f/u with Dr Elgie Congo  ?as soon as possible.  ?Pt had recent CT scan showing fibroid.  ? ?Pt is considering permanent surgical management. ? ? ?

## 2022-03-12 ENCOUNTER — Telehealth: Payer: Self-pay

## 2022-03-12 NOTE — Telephone Encounter (Signed)
A call was made to the patient advising her that she will need medical clearance from her cardiologist prior to her having surgery.  The patient stated understanding.  The patient was given the number of the cardiologist that she last saw in order to make an appointment. ?

## 2022-03-12 NOTE — Progress Notes (Signed)
?Cardiology Office Note:   ? ?Date:  03/18/2022  ? ?ID:  Jasmin Stephenson, DOB 1971-12-14, MRN [id number] ? ?PCP:  Vevelyn Francois, NP  ?Cardiologist:  Kirk Ruths, MD  ?Electrophysiologist:  None  ? ?Referring MD: Vevelyn Francois, NP  ? ?Chief Complaint: pre-op evaluation ? ?History of Present Illness:   ? ?Jasmin Stephenson is a 51 y.o. female with a history of hyperlipidemia, GERD, asthma, and dysmenorrhea who is followed by Dr. Stanford Breed and presents today for pre-op evaluation for upcoming hysterectomy. ? ?Patient has known PAD with ABIs in 05/2020 showing moderate arterial disease on the right but no significant arterial disease on the left.  Dopplers at that time showed inflow stenosis in the mid right external iliac artery of >50% and possible left peroneal artery disease.  This was followed by Vascular Surgery.  She was referred to Dr. Stanford Breed in 02/2021 for preop evaluation prior to fibroid surgery.  At that visit, patient reported that she had previously been told she had an MI in the ED in the past but when she saw her cardiologist for follow-up she was told her heart was strong.  She denies any exertional chest pain or dyspnea with more vigorous activities.  She was able to complete greater than 4 METS at that time and was felt to be at acceptable risk for surgery without any additional ischemic evaluation.  However, an Echo was ordered to assess LV function and rule out wall motion abnormalities given questionable history of CAD.  Echo showed LVEF of 60-65% with normal wall motion, mild LVH, normal diastolic parameters, and borderline dilatation of the aortic root measuring 39 mm.  She was advised to follow-up with Cardiology as needed. ? ?Patient presents today again for preop evaluation prior to upcoming hysterectomy. Here alone. Patient doing well from a cardiac standpoint. She denies any chest pain, shortness of breath, orthopnea, PND, or lower extremity edema. She notes occasional brief palpitations  that she describes as a "fluttering" sensation but no prolonged/sustained palpitations. She also notes occasional dizziness which may be related to pain from her large fibroid. No falls or syncope. She stays active and is easily able to complete >4.0 METS of activity without any problems. She denies any claudication but does reports some pain in her left groin when walking and thinks this is due to her fibroid. ? ?We discussed her questionable history of MI. She states over 10 years ago she was in the hospital for an unrelated issues and was told she was having a heart attack based on EKG. However, she was not having any cardiac symptoms at the time. Unclear if any cardiac enzymes were checked. However, when she was seen by a Cardiologist following this, she was told her heart was fine. ? ?She does continues to smoke. She states she has smoked for over 10 years and currently smokes 1 to 1.5 packs per days which is an improvement from last year. ? ?Past Medical History:  ?Diagnosis Date  ? Allergy   ? Anemia   ? 1 unit  ? Anxiety   ? Arthritis   ? and tendonitis  ? Asthma   ? Breast calcification, right   ? Cervical herniated disc 2017  ? Depression   ? Dysmenorrhea 11/2020  ? Family history of adverse reaction to anesthesia   ? GERD (gastroesophageal reflux disease)   ? Hyperlipidemia   ? Menorrhagia 11/2020  ? Myocardial infarction [facility]) yrs ago  ? " Mild" no cardiologist  ?  Neck fracture ([place])   ? cervical neck fracture  ? Vitamin D deficiency   ? ? ?Past Surgical History:  ?Procedure Laterality Date  ? ABDOMINAL SURGERY    ? APPENDECTOMY  yrs ago  ? BLADDER SURGERY    ? CERVICAL DISC ARTHROPLASTY N/A 09/21/2016  ? Procedure: CERVICAL SIX- CERVICAL SEVEN ARTHROPLASTY;  Surgeon: Kevan Ny Ditty, MD;  Location: [place];  Service: Neurosurgery;  Laterality: N/A;  C6-7 Arthroplasty  ? CHOLECYSTECTOMY N/A 10/10/2019  ? Procedure: LAPAROSCOPIC CHOLECYSTECTOMY WITH INTRAOPERATIVE CHOLANGIOGRAM;  Surgeon: Donnie Mesa, MD;  Location: [place];  Service: General;  Laterality: N/A;  ? FOOT SURGERY Left   ? corrections on foot  ? IR ANGIOGRAM SELECTIVE EACH ADDITIONAL VESSEL  05/26/2021  ? IR ANGIOGRAM VISCERAL SELECTIVE  05/26/2021  ? IR EMBO TUMOR ORGAN ISCHEMIA INFARCT INC GUIDE ROADMAPPING  05/26/2021  ? IR RADIOLOGIST EVAL & MGMT  03/19/2021  ? IR RADIOLOGIST EVAL & MGMT  06/10/2021  ? IR RADIOLOGIST EVAL & MGMT  01/05/2022  ? IR RENAL BILAT S&I MOD SED  05/26/2021  ? IR US GUIDE VASC ACCESS LEFT  05/26/2021  ? IRRIGATION AND DEBRIDEMENT ABSCESS Right 09/10/2014  ? Procedure: IRRIGATION AND DEBRIDEMENT RIGHT BREAST  ABSCESS;  Surgeon: Gayland Curry, MD;  Location: [facility];  Service: General;  Laterality: Right;  ? SYNDACTYLIZATION Right 01/08/2021  ? Procedure: SYNDACTYLIZATION;  Surgeon: Evelina Bucy, DPM;  Location: [facility];  Service: Podiatry;  Laterality: Right;  ? TUBAL LIGATION    ? per pt   ? WISDOM TOOTH EXTRACTION    ? ? ?Current Medications: ?Current Meds  ?Medication Sig  ? acetaminophen (TYLENOL) 500 MG tablet Take 500 mg by mouth every 6 (six) hours as needed.  ? diphenhydrAMINE (BENADRYL) 50 MG tablet Take 1 tablet (50 mg total) by mouth at bedtime as needed for itching.  ? Fluocinolone Acetonide Body 0.01 % OIL   ? fluticasone (FLONASE) 50 MCG/ACT nasal spray Place 2 sprays into both nostrils daily.  ? ondansetron (ZOFRAN) 4 MG tablet Take 1 tablet (4 mg total) by mouth every 6 (six) hours as needed for nausea or vomiting.  ? rosuvastatin (CRESTOR) 10 MG tablet Take 1 tablet (10 mg total) by mouth daily.  ? triamcinolone (KENALOG) 0.1 % Apply 1 application topically 2 (two) times daily.  ? trimethoprim-polymyxin b (POLYTRIM) ophthalmic solution Place 2 drops into the right eye every 4 (four) hours.  ? Vitamin D, Ergocalciferol, (DRISDOL) 1.25 MG (50000 UNIT) CAPS capsule Take 1 capsule (50,000 Units total) by mouth every 7 (seven) days.  ?  ? ?Allergies:   Penicillins  ? ?Social History   ? ?Socioeconomic History  ? Marital status: Single  ?  Spouse name: Not on file  ? Number of children: 3  ? Years of education: Not on file  ? Highest education level: Not on file  ?Occupational History  ? Not on file  ?Tobacco Use  ? Smoking status: Every Day  ?  Packs/day: 2.00  ?  Years: 10.00  ?  Pack years: 20.00  ?  Types: Cigarettes  ? Smokeless tobacco: Never  ?Vaping Use  ? Vaping Use: Never used  ?Substance and Sexual Activity  ? Alcohol use: Yes  ?  Comment: occ  ? Drug use: Not Currently  ?  Types: Marijuana  ? Sexual activity: Yes  ?  Birth control/protection: Surgical  ?Other Topics Concern  ? Not on file  ?Social History Narrative  ?  Not on file  ? ?Social Determinants of Health  ? ?Financial Resource Strain: Not on file  ?Food Insecurity: Not on file  ?Transportation Needs: Not on file  ?Physical Activity: Not on file  ?Stress: Not on file  ?Social Connections: Not on file  ?  ? ?Family History: ?The patient's family history includes Breast cancer (age of onset: 52) in her sister; CAD in her father; Diabetes in her father. There is no history of Alcohol abuse, Arthritis, Asthma, Birth defects, Cancer, COPD, Depression, Drug abuse, Early death, Hearing loss, Heart disease, Hyperlipidemia, Hypertension, Kidney disease, Learning disabilities, Mental illness, Mental retardation, Miscarriages / Stillbirths, Stroke, or Vision loss. ? ?ROS:   ?Please see the history of present illness.    ? ?EKGs/Labs/Other Studies Reviewed:   ? ?The following studies were reviewed today: ? ?ABIs/TBIs 05/31/2020: ?Summary:  ?Right: Resting right ankle-brachial index indicates moderate right lower extremity arterial disease. The right toe-brachial index is abnormal.  ? ?Left: Resting left ankle-brachial index is within normal range. No evidence of significant left lower extremity arterial disease. The left toe-brachial index is normal. ?_______________ ? ?Lower Extremity Arterial Ultrasound 07/15/2020: ?Summary:  ?Right:  Patent right lower extremity arterial system. Possible left peroneal artery disease. 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  ?_______________ ? ?Echocardiogram 04/09/2021: ?Impressions: ? 1. Left ventr

## 2022-03-18 ENCOUNTER — Ambulatory Visit: Payer: Medicaid Other | Admitting: Student

## 2022-03-18 ENCOUNTER — Encounter: Payer: Self-pay | Admitting: Student

## 2022-03-18 VITALS — BP 128/80 | HR 82 | Ht 65.0 in | Wt 179.2 lb

## 2022-03-18 DIAGNOSIS — Z720 Tobacco use: Secondary | ICD-10-CM | POA: Diagnosis not present

## 2022-03-18 DIAGNOSIS — I739 Peripheral vascular disease, unspecified: Secondary | ICD-10-CM | POA: Diagnosis not present

## 2022-03-18 DIAGNOSIS — Z01818 Encounter for other preprocedural examination: Secondary | ICD-10-CM | POA: Diagnosis not present

## 2022-03-18 DIAGNOSIS — E785 Hyperlipidemia, unspecified: Secondary | ICD-10-CM

## 2022-03-18 NOTE — Patient Instructions (Signed)
Medication Instructions:  ?No changes ?*If you need a refill on your cardiac medications before your next appointment, please call your pharmacy* ? ? ?Lab Work: ?None ?If you have labs (blood work) drawn today and your tests are completely normal, you will receive your results only by: ?MyChart Message (if you have MyChart) OR ?A paper copy in the mail ?If you have any lab test that is abnormal or we need to change your treatment, we will call you to review the results. ? ? ?Testing/Procedures: ?None ? ? ?Follow-Up: ?At [facility], you and your health needs are our priority.  As part of our continuing mission to provide you with exceptional heart care, we have created designated Provider Care Teams.  These Care Teams include your primary Cardiologist (physician) and Advanced Practice Providers (APPs -  Physician Assistants and Nurse Practitioners) who all work together to provide you with the care you need, when you need it. ? ?We recommend signing up for the patient portal called "MyChart".  Sign up information is provided on this After Visit Summary.  MyChart is used to connect with patients for Virtual Visits (Telemedicine).  Patients are able to view lab/test results, encounter notes, upcoming appointments, etc.  Non-urgent messages can be sent to your provider as well.   ?To learn more about what you can do with MyChart, go to [email or web address].   ? ?Your next appointment:   ?As needed ? ?The format for your next appointment:   ?In Person ? ?Provider:   ?Kirk Ruths, MD   ? ? ?Other Instructions ?Schedule Follow up appointment with Vascular Surgery.  ?

## 2022-04-06 ENCOUNTER — Other Ambulatory Visit: Payer: Self-pay | Admitting: *Deleted

## 2022-04-06 DIAGNOSIS — I70211 Atherosclerosis of native arteries of extremities with intermittent claudication, right leg: Secondary | ICD-10-CM

## 2022-04-10 ENCOUNTER — Ambulatory Visit: Payer: Medicaid Other | Admitting: Obstetrics and Gynecology

## 2022-04-10 VITALS — BP 141/93 | HR 102 | Wt 182.0 lb

## 2022-04-10 DIAGNOSIS — Z01818 Encounter for other preprocedural examination: Secondary | ICD-10-CM

## 2022-04-10 DIAGNOSIS — D252 Subserosal leiomyoma of uterus: Secondary | ICD-10-CM

## 2022-04-10 DIAGNOSIS — D251 Intramural leiomyoma of uterus: Secondary | ICD-10-CM | POA: Diagnosis not present

## 2022-04-10 NOTE — Progress Notes (Signed)
?OB/GYN Pre-Op History and Physical ? ?Jasmin Stephenson is a 51 y.o. [phone] presenting for preoperative exam prior to [facility].  Pt has tried alternative treatments for her menorrhagia including UFE.   This proved to be ineffective as she continued to have heavy bleeding and some pelvic pain.  Pt desires definitive therapy.  She has had cardiac clearance due to history of myocardial infarction.  Pt has had 3 vaginal deliveries with largest child weighing 8 pounds.  Risks and benefits of the procedure were given including bleeding, infection, and involvement of other organs.  She is aware she will no longer have periods and will no longer have pap smears if there was no history of abnormal pap. ? ?  ? ? ?Past Medical History:  ?Diagnosis Date  ? Allergy   ? Anemia   ? 1 unit  ? Anxiety   ? Arthritis   ? and tendonitis  ? Asthma   ? Breast calcification, right   ? Cervical herniated disc 2017  ? Depression   ? Dysmenorrhea 11/2020  ? Family history of adverse reaction to anesthesia   ? GERD (gastroesophageal reflux disease)   ? Hyperlipidemia   ? Menorrhagia 11/2020  ? Myocardial infarction [facility]) yrs ago  ? " Mild" no cardiologist  ? Neck fracture ([place])   ? cervical neck fracture  ? Vitamin D deficiency   ? ? ?Past Surgical History:  ?Procedure Laterality Date  ? ABDOMINAL SURGERY    ? APPENDECTOMY  yrs ago  ? BLADDER SURGERY    ? CERVICAL DISC ARTHROPLASTY N/A 09/21/2016  ? Procedure: CERVICAL SIX- CERVICAL SEVEN ARTHROPLASTY;  Surgeon: Kevan Ny Ditty, MD;  Location: [place];  Service: Neurosurgery;  Laterality: N/A;  C6-7 Arthroplasty  ? CHOLECYSTECTOMY N/A 10/10/2019  ? Procedure: LAPAROSCOPIC CHOLECYSTECTOMY WITH INTRAOPERATIVE CHOLANGIOGRAM;  Surgeon: Donnie Mesa, MD;  Location: [place];  Service: General;  Laterality: N/A;  ? FOOT SURGERY Left   ? corrections on foot  ? IR ANGIOGRAM SELECTIVE EACH ADDITIONAL VESSEL  05/26/2021  ? IR ANGIOGRAM VISCERAL SELECTIVE  05/26/2021  ? IR EMBO TUMOR ORGAN ISCHEMIA INFARCT  INC GUIDE ROADMAPPING  05/26/2021  ? IR RADIOLOGIST EVAL & MGMT  03/19/2021  ? IR RADIOLOGIST EVAL & MGMT  06/10/2021  ? IR RADIOLOGIST EVAL & MGMT  01/05/2022  ? IR RENAL BILAT S&I MOD SED  05/26/2021  ? IR US GUIDE VASC ACCESS LEFT  05/26/2021  ? IRRIGATION AND DEBRIDEMENT ABSCESS Right 09/10/2014  ? Procedure: IRRIGATION AND DEBRIDEMENT RIGHT BREAST  ABSCESS;  Surgeon: Gayland Curry, MD;  [facility];  Service: General;  Laterality: Right;  ? SYNDACTYLIZATION Right 01/08/2021  ? Procedure: SYNDACTYLIZATION;  Surgeon: Evelina Bucy, DPM;  [facility];  Service: Podiatry;  Laterality: Right;  ? TUBAL LIGATION    ? per pt   ? WISDOM TOOTH EXTRACTION    ? ? ?OB History  ?Gravida Para Term Preterm AB Living  ?'3 3 3     3  '$ ?SAB IAB Ectopic Multiple Live Births  ?           ?  ?# Outcome Date GA Lbr Len/2nd Weight Sex Delivery Anes PTL Lv  ?3 Term      Vag-Spont     ?2 Term      Vag-Spont     ?1 Term      Vag-Spont     ? ? ?Social History  ? ?Socioeconomic History  ? Marital status: Single  ?  Spouse name: Not on file  ? Number of children: 3  ? Years of education: Not on file  ? Highest education level: Not on file  ?Occupational History  ? Not on file  ?Tobacco Use  ? Smoking status: Every Day  ?  Packs/day: 2.00  ?  Years: 10.00  ?  Pack years: 20.00  ?  Types: Cigarettes  ? Smokeless tobacco: Never  ?Vaping Use  ? Vaping Use: Never used  ?Substance and Sexual Activity  ? Alcohol use: Yes  ?  Comment: occ  ? Drug use: Not Currently  ?  Types: Marijuana  ? Sexual activity: Yes  ?  Birth control/protection: Surgical  ?Other Topics Concern  ? Not on file  ?Social History Narrative  ? Not on file  ? ?Social Determinants of Health  ? ?Financial Resource Strain: Not on file  ?Food Insecurity: Not on file  ?Transportation Needs: Not on file  ?Physical Activity: Not on file  ?Stress: Not on file  ?Social Connections: Not on file  ? ? ?Family History  ?Problem Relation Age of Onset  ? Diabetes Father    ? CAD Father   ? Breast cancer Sister 59  ? Alcohol abuse Neg Hx   ? Arthritis Neg Hx   ? Asthma Neg Hx   ? Birth defects Neg Hx   ? Cancer Neg Hx   ? COPD Neg Hx   ? Depression Neg Hx   ? Drug abuse Neg Hx   ? Early death Neg Hx   ? Hearing loss Neg Hx   ? Heart disease Neg Hx   ? Hyperlipidemia Neg Hx   ? Hypertension Neg Hx   ? Kidney disease Neg Hx   ? Learning disabilities Neg Hx   ? Mental illness Neg Hx   ? Mental retardation Neg Hx   ? Miscarriages / Stillbirths Neg Hx   ? Stroke Neg Hx   ? Vision loss Neg Hx   ? ? ?(Not in a hospital admission) ? ? ?Allergies  ?Allergen Reactions  ? Penicillins Nausea And Vomiting and Rash  ?  Has patient had a PCN reaction causing immediate rash, facial/tongue/throat swelling, SOB or lightheadedness with hypotension: Yes ?Has patient had a PCN reaction causing severe rash involving mucus membranes or skin necrosis: Yes ?Has patient had a PCN reaction that required hospitalization No ?Has patient had a PCN reaction occurring within the last 10 years: No ?If all of the above answers are "NO", then may proceed with Cephalosporin use.  ? ? ?Review of Systems: Negative except for what is mentioned in HPI. ? ?  ? ?Physical Exam: ?BP (!) 141/93   Pulse (!) 102   Wt 82.6 kg   LMP 03/27/2022   BMI 30.29 kg/m?  ?CONSTITUTIONAL: Well-developed, well-nourished female in no acute distress.  ?HENT:  Normocephalic, atraumatic, External right and left ear normal. Oropharynx is clear and moist ?EYES: Conjunctivae and EOM are normal. Pupils are equal, round, and reactive to light. No scleral icterus.  ?NECK: Normal range of motion, supple, no masses ?SKIN: Skin is warm and dry. No rash noted. Not diaphoretic. No erythema. No pallor. ?[place]: Alert and oriented to person, place, and time. Normal reflexes, muscle tone coordination. No cranial nerve deficit noted. ?PSYCHIATRIC: Normal mood and affect. Normal behavior. Normal judgment and thought content. ?CARDIOVASCULAR: Normal heart  rate noted, regular rhythm ?RESPIRATORY: Effort and breath sounds normal, no problems with respiration noted ?ABDOMEN: Soft, nontender, nondistended, no overt masses ?PELVIC: cervix  visualized, easy to access, bimanual shows 10-11 cm uterus, mobile ?MUSCULOSKELETAL: Normal range of motion. No edema and no tenderness. 2+ distal pulses. ? ? ?Pertinent Labs/Studies:   ?CLINICAL DATA:  Menorrhagia. ?  ?EXAM: ?TRANSABDOMINAL AND TRANSVAGINAL ULTRASOUND OF PELVIS ?  ?TECHNIQUE: ?Both transabdominal and transvaginal ultrasound examinations of the ?pelvis were performed. Transabdominal technique was performed for ?global imaging of the pelvis including uterus, ovaries, adnexal ?regions, and pelvic cul-de-sac. It was necessary to proceed with ?endovaginal exam following the transabdominal exam to visualize the ?uterus and ovaries. ?  ?COMPARISON:  CT 11/22/2006. ?  ?FINDINGS: ?Uterus ?  ?Measurements: 13.6 x 6.7 x 8.0 cm = volume: 381.4 mL. 6.2 x 5.1 x ?5.1 cm fibroid. ?  ?Endometrium ?  ?Thickness: 7.9 mm. Endometrium is slightly heterogeneous. Tiny ?amount of endometrial canal fluid noted. ?  ?Right ovary ?  ?Measurements: 2.4 x 1.4 x 2.0 cm = volume: 3.5 mL. Normal ?appearance/no adnexal mass. ?  ?Left ovary ?  ?Measurements: 2.5 x 1.6 x 1.1 cm = volume: 2.3 mL. Normal ?appearance/no adnexal mass. ?  ?Color flow to both ovaries noted. ?  ?Other findings ?  ?No abnormal free fluid. ?  ?IMPRESSION: ?1. Endometrium is slightly heterogeneous. Tiny amount of endometrial ?canal fluid noted. If bleeding remains unresponsive to hormonal or ?medical therapy, sonohysterogram should be considered for focal ?lesion work-up. (Ref: Radiological Reasoning: Algorithmic Workup of ?Abnormal Vaginal Bleeding with Endovaginal Sonography and ?Sonohysterography. AJR 2008; 748:O70-78) ?2. 6.2 x 5.1 x 5.1 cm fibroid. ?  ? ?Assessment and Plan :MELODI HAPPEL is a 51 y.o. [phone] here for preoperative examination for total vaginal  hysterectomy. ? ? ?Plan for TVH ?NPO ?Admission labs ordered ?VS Q4 ?Clearances noted, risks and benefits given. ? ? ?Lynnda Shields, M.D. ?Attending [place], Faculty Practice ?Center for [facility]

## 2022-04-10 NOTE — Progress Notes (Signed)
Pt has several questions about procedure - how long will procedure take ? ?

## 2022-04-10 NOTE — H&P (View-Only) (Signed)
?OB/GYN Pre-Op History and Physical ? ?Jasmin Stephenson is a 51 y.o. [phone] presenting for preoperative exam prior to [facility].  Pt has tried alternative treatments for her menorrhagia including UFE.   This proved to be ineffective as she continued to have heavy bleeding and some pelvic pain.  Pt desires definitive therapy.  She has had cardiac clearance due to history of myocardial infarction.  Pt has had 3 vaginal deliveries with largest child weighing 8 pounds.  Risks and benefits of the procedure were given including bleeding, infection, and involvement of other organs.  She is aware she will no longer have periods and will no longer have pap smears if there was no history of abnormal pap. ? ?  ? ? ?Past Medical History:  ?Diagnosis Date  ? Allergy   ? Anemia   ? 1 unit  ? Anxiety   ? Arthritis   ? and tendonitis  ? Asthma   ? Breast calcification, right   ? Cervical herniated disc 2017  ? Depression   ? Dysmenorrhea 11/2020  ? Family history of adverse reaction to anesthesia   ? GERD (gastroesophageal reflux disease)   ? Hyperlipidemia   ? Menorrhagia 11/2020  ? Myocardial infarction [facility]) yrs ago  ? " Mild" no cardiologist  ? Neck fracture ([place])   ? cervical neck fracture  ? Vitamin D deficiency   ? ? ?Past Surgical History:  ?Procedure Laterality Date  ? ABDOMINAL SURGERY    ? APPENDECTOMY  yrs ago  ? BLADDER SURGERY    ? CERVICAL DISC ARTHROPLASTY N/A 09/21/2016  ? Procedure: CERVICAL SIX- CERVICAL SEVEN ARTHROPLASTY;  Surgeon: Kevan Ny Ditty, MD;  Location: [place];  Service: Neurosurgery;  Laterality: N/A;  C6-7 Arthroplasty  ? CHOLECYSTECTOMY N/A 10/10/2019  ? Procedure: LAPAROSCOPIC CHOLECYSTECTOMY WITH INTRAOPERATIVE CHOLANGIOGRAM;  Surgeon: Donnie Mesa, MD;  Location: [place];  Service: General;  Laterality: N/A;  ? FOOT SURGERY Left   ? corrections on foot  ? IR ANGIOGRAM SELECTIVE EACH ADDITIONAL VESSEL  05/26/2021  ? IR ANGIOGRAM VISCERAL SELECTIVE  05/26/2021  ? IR EMBO TUMOR ORGAN ISCHEMIA INFARCT  INC GUIDE ROADMAPPING  05/26/2021  ? IR RADIOLOGIST EVAL & MGMT  03/19/2021  ? IR RADIOLOGIST EVAL & MGMT  06/10/2021  ? IR RADIOLOGIST EVAL & MGMT  01/05/2022  ? IR RENAL BILAT S&I MOD SED  05/26/2021  ? IR US GUIDE VASC ACCESS LEFT  05/26/2021  ? IRRIGATION AND DEBRIDEMENT ABSCESS Right 09/10/2014  ? Procedure: IRRIGATION AND DEBRIDEMENT RIGHT BREAST  ABSCESS;  Surgeon: Gayland Curry, MD;  Location: [facility];  Service: General;  Laterality: Right;  ? SYNDACTYLIZATION Right 01/08/2021  ? Procedure: SYNDACTYLIZATION;  Surgeon: Evelina Bucy, DPM;  Location: [facility];  Service: Podiatry;  Laterality: Right;  ? TUBAL LIGATION    ? per pt   ? WISDOM TOOTH EXTRACTION    ? ? ?OB History  ?Gravida Para Term Preterm AB Living  ?'3 3 3     3  '$ ?SAB IAB Ectopic Multiple Live Births  ?           ?  ?# Outcome Date GA Lbr Len/2nd Weight Sex Delivery Anes PTL Lv  ?3 Term      Vag-Spont     ?2 Term      Vag-Spont     ?1 Term      Vag-Spont     ? ? ?Social History  ? ?Socioeconomic History  ? Marital status: Single  ?  Spouse name: Not on file  ? Number of children: 3  ? Years of education: Not on file  ? Highest education level: Not on file  ?Occupational History  ? Not on file  ?Tobacco Use  ? Smoking status: Every Day  ?  Packs/day: 2.00  ?  Years: 10.00  ?  Pack years: 20.00  ?  Types: Cigarettes  ? Smokeless tobacco: Never  ?Vaping Use  ? Vaping Use: Never used  ?Substance and Sexual Activity  ? Alcohol use: Yes  ?  Comment: occ  ? Drug use: Not Currently  ?  Types: Marijuana  ? Sexual activity: Yes  ?  Birth control/protection: Surgical  ?Other Topics Concern  ? Not on file  ?Social History Narrative  ? Not on file  ? ?Social Determinants of Health  ? ?Financial Resource Strain: Not on file  ?Food Insecurity: Not on file  ?Transportation Needs: Not on file  ?Physical Activity: Not on file  ?Stress: Not on file  ?Social Connections: Not on file  ? ? ?Family History  ?Problem Relation Age of Onset  ? Diabetes Father    ? CAD Father   ? Breast cancer Sister 51  ? Alcohol abuse Neg Hx   ? Arthritis Neg Hx   ? Asthma Neg Hx   ? Birth defects Neg Hx   ? Cancer Neg Hx   ? COPD Neg Hx   ? Depression Neg Hx   ? Drug abuse Neg Hx   ? Early death Neg Hx   ? Hearing loss Neg Hx   ? Heart disease Neg Hx   ? Hyperlipidemia Neg Hx   ? Hypertension Neg Hx   ? Kidney disease Neg Hx   ? Learning disabilities Neg Hx   ? Mental illness Neg Hx   ? Mental retardation Neg Hx   ? Miscarriages / Stillbirths Neg Hx   ? Stroke Neg Hx   ? Vision loss Neg Hx   ? ? ?(Not in a hospital admission) ? ? ?Allergies  ?Allergen Reactions  ? Penicillins Nausea And Vomiting and Rash  ?  Has patient had a PCN reaction causing immediate rash, facial/tongue/throat swelling, SOB or lightheadedness with hypotension: Yes ?Has patient had a PCN reaction causing severe rash involving mucus membranes or skin necrosis: Yes ?Has patient had a PCN reaction that required hospitalization No ?Has patient had a PCN reaction occurring within the last 10 years: No ?If all of the above answers are "NO", then may proceed with Cephalosporin use.  ? ? ?Review of Systems: Negative except for what is mentioned in HPI. ? ?  ? ?Physical Exam: ?BP (!) 141/93   Pulse (!) 102   Wt 82.6 kg   LMP 03/27/2022   BMI 30.29 kg/m?  ?CONSTITUTIONAL: Well-developed, well-nourished female in no acute distress.  ?HENT:  Normocephalic, atraumatic, External right and left ear normal. Oropharynx is clear and moist ?EYES: Conjunctivae and EOM are normal. Pupils are equal, round, and reactive to light. No scleral icterus.  ?NECK: Normal range of motion, supple, no masses ?SKIN: Skin is warm and dry. No rash noted. Not diaphoretic. No erythema. No pallor. ?[place]: Alert and oriented to person, place, and time. Normal reflexes, muscle tone coordination. No cranial nerve deficit noted. ?PSYCHIATRIC: Normal mood and affect. Normal behavior. Normal judgment and thought content. ?CARDIOVASCULAR: Normal heart  rate noted, regular rhythm ?RESPIRATORY: Effort and breath sounds normal, no problems with respiration noted ?ABDOMEN: Soft, nontender, nondistended, no overt masses ?PELVIC: cervix  visualized, easy to access, bimanual shows 10-11 cm uterus, mobile ?MUSCULOSKELETAL: Normal range of motion. No edema and no tenderness. 2+ distal pulses. ? ? ?Pertinent Labs/Studies:   ?CLINICAL DATA:  Menorrhagia. ?  ?EXAM: ?TRANSABDOMINAL AND TRANSVAGINAL ULTRASOUND OF PELVIS ?  ?TECHNIQUE: ?Both transabdominal and transvaginal ultrasound examinations of the ?pelvis were performed. Transabdominal technique was performed for ?global imaging of the pelvis including uterus, ovaries, adnexal ?regions, and pelvic cul-de-sac. It was necessary to proceed with ?endovaginal exam following the transabdominal exam to visualize the ?uterus and ovaries. ?  ?COMPARISON:  CT 11/22/2006. ?  ?FINDINGS: ?Uterus ?  ?Measurements: 13.6 x 6.7 x 8.0 cm = volume: 381.4 mL. 6.2 x 5.1 x ?5.1 cm fibroid. ?  ?Endometrium ?  ?Thickness: 7.9 mm. Endometrium is slightly heterogeneous. Tiny ?amount of endometrial canal fluid noted. ?  ?Right ovary ?  ?Measurements: 2.4 x 1.4 x 2.0 cm = volume: 3.5 mL. Normal ?appearance/no adnexal mass. ?  ?Left ovary ?  ?Measurements: 2.5 x 1.6 x 1.1 cm = volume: 2.3 mL. Normal ?appearance/no adnexal mass. ?  ?Color flow to both ovaries noted. ?  ?Other findings ?  ?No abnormal free fluid. ?  ?IMPRESSION: ?1. Endometrium is slightly heterogeneous. Tiny amount of endometrial ?canal fluid noted. If bleeding remains unresponsive to hormonal or ?medical therapy, sonohysterogram should be considered for focal ?lesion work-up. (Ref: Radiological Reasoning: Algorithmic Workup of ?Abnormal Vaginal Bleeding with Endovaginal Sonography and ?Sonohysterography. AJR 2008; [id number]:[id number]) ?2. 6.2 x 5.1 x 5.1 cm fibroid. ?  ? ?Assessment and Plan :Jasmin Stephenson is a 4 y.o. [phone] here for preoperative examination for total vaginal  hysterectomy. ? ? ?Plan for [facility] ?NPO ?Admission labs ordered ?VS Q4 ?Clearances noted, risks and benefits given. ? ? ?Lynnda Shields, M.D. ?Attending [place], Faculty Practice ?Center for [facility]

## 2022-04-20 ENCOUNTER — Ambulatory Visit: Payer: Medicaid Other | Admitting: Surgery

## 2022-04-20 ENCOUNTER — Encounter: Payer: Self-pay | Admitting: Surgery

## 2022-04-20 ENCOUNTER — Ambulatory Visit (HOSPITAL_COMMUNITY)
Admission: RE | Admit: 2022-04-20 | Discharge: 2022-04-20 | Disposition: A | Discharge status: Home / Self Care | Payer: Medicaid Other | Source: Ambulatory Visit | Attending: Surgery | Admitting: Surgery

## 2022-04-20 ENCOUNTER — Encounter (HOSPITAL_COMMUNITY): Payer: Self-pay | Admitting: Obstetrics and Gynecology

## 2022-04-20 ENCOUNTER — Other Ambulatory Visit: Payer: Self-pay

## 2022-04-20 ENCOUNTER — Ambulatory Visit (INDEPENDENT_AMBULATORY_CARE_PROVIDER_SITE_OTHER)
Admission: RE | Admit: 2022-04-20 | Discharge: 2022-04-20 | Disposition: A | Discharge status: Home / Self Care | Payer: Medicaid Other | Source: Ambulatory Visit | Attending: Surgery | Admitting: Surgery

## 2022-04-20 VITALS — BP 132/90 | HR 68 | Temp 98.1°F | Resp 20 | Ht 65.0 in | Wt 179.0 lb

## 2022-04-20 DIAGNOSIS — I70211 Atherosclerosis of native arteries of extremities with intermittent claudication, right leg: Secondary | ICD-10-CM | POA: Insufficient documentation

## 2022-04-20 NOTE — Progress Notes (Signed)
Anesthesia Chart Review: ?Same day workup ? ?Patient follows with cardiology for history of HTN, PAD (ABIs in 05/2020 showing moderate arterial disease on the right but no significant arterial disease on the left.  Dopplers at that time showed inflow stenosis in the mid right external iliac artery of >50% and possible left peroneal artery disease. ), questionable remote history of MI.  Last seen by Sande Rives, PA-C for 523.  Per note, "Pre-Op Evaluation Patient has upcoming hysterectomy planned.  She is doing well from a cardiac standpoint with no angina, acute CHF symptoms, palpitations, syncope.  She is able to complete > 4 METS without any problems. Per Revised Cardiac Risk Index, considered low risk. Therefore, based on ACC/AHA guidelines, patient would be at acceptable risk for the planned procedure without further cardiovascular testing. We have not received a pre-op clearance form for this yet but I will go ahead and route this note to Dr. Elgie Congo. Questionable CAD Patient was previously told she had an MI at a prior ED visit but on follow-up with her Cardiologist at the time she was told her heart was strong.  Echo was ordered at last visit to assess LV function and wall motion and showed LVEF of 60-65% with no wall motion abnormalities. It does not sound like she has ever had an ischemic evaluation and there are no chest CTs that show coronary calcifications. - She is asymptomatic at this time so no additional work-up necessary." ? ?Patient will need day of surgery labs and evaluation. ? ?EKG 03/18/2022: NSR.  Rate 82. ? ?TTE 04/09/2021: ? 1. Left ventricular ejection fraction, by estimation, is 60 to 65%. The  ?left ventricle has normal function. The left ventricle has no regional  ?wall motion abnormalities. There is mild left ventricular hypertrophy.  ?Left ventricular diastolic parameters  ?were normal. The average left ventricular global longitudinal strain is  ?-18.4 %. The global longitudinal strain is  normal.  ? 2. Right ventricular systolic function is normal. The right ventricular  ?size is normal. There is normal pulmonary artery systolic pressure.  ? 3. The mitral valve is normal in structure. Trivial mitral valve  ?regurgitation. No evidence of mitral stenosis.  ? 4. The aortic valve is tricuspid. Aortic valve regurgitation is trivial.  ?No aortic stenosis is present.  ? 5. Aortic dilatation noted. There is borderline dilatation of the aortic  ?root, measuring 39 mm.  ? 6. The inferior vena cava is normal in size with greater than 50%  ?respiratory variability, suggesting right atrial pressure of 3 mmHg.  ? ? ?Jasmin Caldwell, PA-C ?[facility] Short Stay Center/Anesthesiology ?Phone [phone] ?04/20/2022 12:51 PM ? ?

## 2022-04-20 NOTE — Anesthesia Preprocedure Evaluation (Addendum)
Anesthesia Evaluation  ?Patient identified by MRN, date of birth, ID band ?Patient awake ? ? ? ?Reviewed: ?Allergy & Precautions, NPO status , Patient's Chart, lab work & pertinent test results ? ?History of Anesthesia Complications ?Negative for: history of anesthetic complications ? ?Airway ?Mallampati: IV ? ?TM Distance: <3 FB ?Neck ROM: Full ? ? ? Dental ? ?(+) Teeth Intact, Dental Advisory Given,  ?  ?Pulmonary ?neg shortness of breath, asthma , neg recent URI, Current Smoker and Patient abstained from smoking.,  ?  ?breath sounds clear to auscultation ? ? ? ? ? ? Cardiovascular ?(-) hypertension(-) angina(-) CAD and (-) CHF  ?Rhythm:Regular  ??1. Left ventricular ejection fraction, by estimation, is 60 to 65%. The  ?left ventricle has normal function. The left ventricle has no regional  ?wall motion abnormalities. There is mild left ventricular hypertrophy.  ?Left ventricular diastolic parameters  ?were normal. The average left ventricular global longitudinal strain is  ?-18.4 %. The global longitudinal strain is normal.  ??2. Right ventricular systolic function is normal. The right ventricular  ?size is normal. There is normal pulmonary artery systolic pressure.  ??3. The mitral valve is normal in structure. Trivial mitral valve  ?regurgitation. No evidence of mitral stenosis.  ??4. The aortic valve is tricuspid. Aortic valve regurgitation is trivial.  ?No aortic stenosis is present.  ??5. Aortic dilatation noted. There is borderline dilatation of the aortic  ?root, measuring 39 mm.  ??6. The inferior vena cava is normal in size with greater than 50%  ?respiratory variability, suggesting right atrial pressure of 3 mmHg.  ?  ?Neuro/Psych ?PSYCHIATRIC DISORDERS Anxiety Depression Bipolar Disorder  Neuromuscular disease   ? GI/Hepatic ?Neg liver ROS, GERD  Controlled,  ?Endo/Other  ?negative endocrine ROS ? Renal/GU ?Lab Results ?     Component                Value                Date                 ?     CREATININE               1.00                04/21/2022           ?  ? ?  ?Musculoskeletal ? ?(+) Arthritis ,  ? Abdominal ?  ?Peds ? Hematology ? ?(+) Blood dyscrasia, anemia , Lab Results ?     Component                Value               Date                 ?     WBC                      8.2                 04/21/2022           ?     HGB                      9.0 (L)             04/21/2022           ?     HCT  31.7 (L)            04/21/2022           ?     MCV                      73.7 (L)            04/21/2022           ?     PLT                      432 (H)             04/21/2022           ?   ?Anesthesia Other Findings ? ? Reproductive/Obstetrics ?Lab Results ?     Component                Value               Date                 ?     PREGTESTUR               NEGATIVE            04/21/2022           ?     PREGSERUM                NEGATIVE            05/26/2021           ? ? ?  ? ? ? ? ? ? ? ? ? ? ? ? ? ?  ?  ? ? ? ? ? ? ?Anesthesia Physical ?Anesthesia Plan ? ?ASA: 2 ? ?Anesthesia Plan: General  ? ?Post-op Pain Management: Tylenol PO (pre-op)*, Toradol IV (intra-op)* and Ketamine IV*  ? ?Induction: Intravenous ? ?PONV Risk Score and Plan: 2 and Dexamethasone and Ondansetron ? ?Airway Management Planned: Oral ETT ? ?Additional Equipment: None ? ?Intra-op Plan:  ? ?Post-operative Plan: Extubation in OR ? ?Informed Consent: I have reviewed the patients History and Physical, chart, labs and discussed the procedure including the risks, benefits and alternatives for the proposed anesthesia with the patient or authorized representative who has indicated his/her understanding and acceptance.  ? ? ? ?Dental advisory given ? ?Plan Discussed with: CRNA ? ?Anesthesia Plan Comments: (PAT note by Karoline Caldwell, PA-C: ?Patient follows with cardiology for history of HTN, PAD (ABIs in 05/2020 showing moderate arterial disease on the right but no significant arterial disease?on the  left. ?Dopplers at that time showed inflow stenosis in the mid right external iliac artery of >50% and possible left peroneal artery?disease.?), questionable remote history of MI.  Last seen by Sande Rives, PA-C for 523.  Per note, "Pre-Op Evaluation Patient has upcoming hysterectomy planned. ?She is doing well from a cardiac standpoint with no angina, acute CHF symptoms, palpitations, syncope. ?She is able to complete >?4 METS without any problems. Per Revised Cardiac Risk Index, considered low risk.?Therefore, based on ACC/AHA guidelines, patient would be at acceptable risk for the planned procedure without further cardiovascular testing.?We have not received a pre-op clearance form for this yet but I will go ahead and route this note to Dr. Elgie Congo. Questionable CAD Patient was previously told she had an MI at a prior ED visit but on follow-up with her?Cardiologist at the time she was told her  heart was strong. ?Echo was ordered at last visit to assess LV function and wall motion and showed LVEF of 60-65% with no wall motion abnormalities. It does not sound like she?has ever had an ischemic evaluation and there are?no chest CTs?that show?coronary calcifications.?-?She is asymptomatic at this time?so no additional work-up necessary." ? ?Patient will need day of surgery labs and evaluation. ? ?EKG 03/18/2022: NSR.  Rate 82. ? ?TTE 04/09/2021: ??1. Left ventricular ejection fraction, by estimation, is 60 to 65%. The  ?left ventricle has normal function. The left ventricle has no regional  ?wall motion abnormalities. There is mild left ventricular hypertrophy.  ?Left ventricular diastolic parameters  ?were normal. The average left ventricular global longitudinal strain is  ?-18.4 %. The global longitudinal strain is normal.  ??2. Right ventricular systolic function is normal. The right ventricular  ?size is normal. There is normal pulmonary artery systolic pressure.  ??3. The mitral valve is normal in structure.  Trivial mitral valve  ?regurgitation. No evidence of mitral stenosis.  ??4. The aortic valve is tricuspid. Aortic valve regurgitation is trivial.  ?No aortic stenosis is present.  ??5. Aortic dilatation noted. There is borderline dilatation of the aortic  ?root, measuring 39 mm.  ??6. The inferior vena cava is normal in size with greater than 50%  ?respiratory variability, suggesting right atrial pressure of 3 mmHg.  ? ?)  ? ? ? ? ? ?Anesthesia Quick Evaluation ? ?

## 2022-04-20 NOTE — Progress Notes (Signed)
? ?[facility] ? ?Patient name: Jasmin Stephenson MRN: [id number] DOB: 02-01-1971 Sex: female ? ? ?REASON FOR VISIT:  ? ? ?Follow up ? ?HISOTRY OF PRESENT ILLNESS:  ? ? ?Jasmin Stephenson is a 51 y.o. female who I met in 2021 for lower extremity vascular disease.  She was complaining of cramping in her right thigh when walking up 3 flights of stairs.  The pain went away with rest.  She did not have rest pain or open wounds.  There is a family history of vascular disease, as her father had a heart attack at age 82.  I started her on a statin and aspirin.  She she began an exercise program and is trying to stop smoking.  She was also given cilostazol.  She is back today for follow-up.  She is still has right leg claudication walking up stairs but this is tolerable.  She has cut back on smoking but has not quit ? ? ?PAST MEDICAL HISTORY:  ? ?Past Medical History:  ?Diagnosis Date  ? Allergy   ? Anemia   ? 1 unit  ? Anxiety   ? Arthritis   ? and tendonitis  ? Asthma   ? Breast calcification, right   ? Cervical herniated disc 2017  ? Depression   ? Dysmenorrhea 11/2020  ? Family history of adverse reaction to anesthesia   ? GERD (gastroesophageal reflux disease)   ? Hyperlipidemia   ? Menorrhagia 11/2020  ? Myocardial infarction [facility]) yrs ago  ? " Mild" no cardiologist  ? Neck fracture ([place])   ? cervical neck fracture  ? Vitamin D deficiency   ? ? ? ?FAMILY HISTORY:  ? ?Family History  ?Problem Relation Age of Onset  ? Diabetes Father   ? CAD Father   ? Breast cancer Sister 44  ? Alcohol abuse Neg Hx   ? Arthritis Neg Hx   ? Asthma Neg Hx   ? Birth defects Neg Hx   ? Cancer Neg Hx   ? COPD Neg Hx   ? Depression Neg Hx   ? Drug abuse Neg Hx   ? Early death Neg Hx   ? Hearing loss Neg Hx   ? Heart disease Neg Hx   ? Hyperlipidemia Neg Hx   ? Hypertension Neg Hx   ? Kidney disease Neg Hx   ? Learning disabilities Neg Hx   ? Mental illness Neg Hx   ? Mental  retardation Neg Hx   ? Miscarriages / Stillbirths Neg Hx   ? Stroke Neg Hx   ? Vision loss Neg Hx   ? ? ?SOCIAL HISTORY:  ? ?Social History  ? ?Tobacco Use  ? Smoking status: Every Day  ?  Packs/day: 2.00  ?  Years: 10.00  ?  Pack years: 20.00  ?  Types: Cigarettes  ? Smokeless tobacco: Never  ?Substance Use Topics  ? Alcohol use: Yes  ?  Comment: occ  ? ? ? ?ALLERGIES:  ? ?Allergies  ?Allergen Reactions  ? Penicillins Nausea And Vomiting and Rash  ?  Has patient had a PCN reaction causing immediate rash, facial/tongue/throat swelling, SOB or lightheadedness with hypotension: Yes ?Has patient had a PCN reaction causing severe rash involving mucus membranes or skin necrosis: Yes ?Has patient had a PCN reaction that required hospitalization No ?Has patient had a PCN reaction occurring within the last 10 years: No ?If all of the above answers are "NO", then may proceed with  Cephalosporin use.  ? ? ? ?CURRENT MEDICATIONS:  ? ?Current Outpatient Medications  ?Medication Sig Dispense Refill  ? acetaminophen (TYLENOL) 500 MG tablet Take 500 mg by mouth every 6 (six) hours as needed for moderate pain.    ? albuterol (PROVENTIL) (2.5 MG/3ML) 0.083% nebulizer solution Take 3 mLs (2.5 mg total) by nebulization every 6 (six) hours as needed for wheezing or shortness of breath. 75 mL 12  ? albuterol (VENTOLIN HFA) 108 (90 Base) MCG/ACT inhaler Inhale 2 puffs into the lungs every 4 (four) hours as needed for wheezing or shortness of breath (cough, shortness of breath or wheezing.). 1 each 11  ? aspirin EC 81 MG tablet Take 1 tablet (81 mg total) by mouth daily. 150 tablet 2  ? cilostazol (PLETAL) 100 MG tablet Take 1 tablet (100 mg total) by mouth 2 (two) times daily before a meal. 60 tablet 11  ? ketoconazole (NIZORAL) 2 % shampoo Apply 1 application. topically 2 (two) times a week.    ? rosuvastatin (CRESTOR) 10 MG tablet Take 1 tablet (10 mg total) by mouth daily. 30 tablet 10  ? triamcinolone (KENALOG) 0.1 % Apply 1  application topically 2 (two) times daily. (Patient taking differently: Apply 1 application. topically 2 (two) times daily as needed (irritation).) 30 g 1  ? trimethoprim-polymyxin b (POLYTRIM) ophthalmic solution Place 2 drops into the right eye every 4 (four) hours. 10 mL 0  ? ciclopirox (PENLAC) 8 % solution Apply topically at bedtime. Apply over nail and surrounding skin. Apply daily over previous coat. Remove weekly with file or polish remover. (Patient not taking: Reported on 04/16/2022) 6.6 mL 0  ? diphenhydrAMINE (BENADRYL) 50 MG tablet Take 1 tablet (50 mg total) by mouth at bedtime as needed for itching. (Patient not taking: Reported on 04/16/2022) 30 tablet 2  ? docusate sodium (COLACE) 100 MG capsule Take 1 capsule (100 mg total) by mouth 2 (two) times daily. (Patient not taking: Reported on 04/16/2022) 10 capsule 0  ? ferrous sulfate 324 (65 Fe) MG TBEC TAKE 1 TABLET (324 MG TOTAL) BY MOUTH IN THE MORNING AND AT BEDTIME. (Patient not taking: Reported on 06/04/2021) 180 tablet 3  ? fluticasone (FLONASE) 50 MCG/ACT nasal spray Place 2 sprays into both nostrils daily. (Patient not taking: Reported on 04/16/2022) 16 g 6  ? ondansetron (ZOFRAN) 4 MG tablet Take 1 tablet (4 mg total) by mouth every 6 (six) hours as needed for nausea or vomiting. (Patient not taking: Reported on 04/16/2022) 30 tablet 0  ? Vitamin D, Ergocalciferol, (DRISDOL) 1.25 MG (50000 UNIT) CAPS capsule Take 1 capsule (50,000 Units total) by mouth every 7 (seven) days. (Patient not taking: Reported on 04/16/2022) 5 capsule 6  ? ?No current facility-administered medications for this visit.  ? ? ?REVIEW OF SYSTEMS:  ? ?[X] denotes positive finding, [ ] denotes negative finding ?Cardiac  Comments:  ?Chest pain or chest pressure:    ?Shortness of breath upon exertion:    ?Short of breath when lying flat:    ?Irregular heart rhythm:    ?    ?Vascular    ?Pain in calf, thigh, or hip brought on by ambulation: x   ?Pain in feet at night that wakes you up from  your sleep:     ?Blood clot in your veins:    ?Leg swelling:     ?    ?Pulmonary    ?Oxygen at home:    ?Productive cough:     ?Wheezing:     ?    ?  Neurologic    ?Sudden weakness in arms or legs:     ?Sudden numbness in arms or legs:     ?Sudden onset of difficulty speaking or slurred speech:    ?Temporary loss of vision in one eye:     ?Problems with dizziness:     ?    ?Gastrointestinal    ?Blood in stool:     ?Vomited blood:     ?    ?Genitourinary    ?Burning when urinating:     ?Blood in urine:    ?    ?Psychiatric    ?Major depression:     ?    ?Hematologic    ?Bleeding problems:    ?Problems with blood clotting too easily:    ?    ?Skin    ?Rashes or ulcers:    ?    ?Constitutional    ?Fever or chills:    ? ? ?PHYSICAL EXAM:  ? ?Vitals:  ? 04/20/22 1354  ?BP: 132/90  ?Pulse: 68  ?Resp: 20  ?Temp: 98.1 ?F (36.7 ?C)  ?SpO2: 95%  ?Weight: 179 lb (81.2 kg)  ?Height: 5' 5" (1.651 m)  ? ? ?GENERAL: The patient is a well-nourished female, in no acute distress. The vital signs are documented above. ?CARDIAC: There is a regular rate and rhythm.  ?VASCULAR: Palpable posterior tibial pulse on the left nonpalpable on the right ?PULMONARY: Non-labored respirations ?MUSCULOSKELETAL: There are no major deformities or cyanosis. ?NEUROLOGIC: No focal weakness or paresthesias are detected. ?SKIN: There are no ulcers or rashes noted. ?PSYCHIATRIC: The patient has a normal affect. ? ?STUDIES:  ? ?I have reviewed the following: ? ?A focal velocity elevation of 386 cm/s was obtained at Mid external iliac  ?with a VR of 3.0. Findings are characteristic of 50-74% stenosis.  ? ?+-------+-----------+-----------+------------+------------+  ?ABI/TBIToday's ABIToday's TBIPrevious ABIPrevious TBI  ?+-------+-----------+-----------+------------+------------+  ?Right  1.08       0.82       0.76        0.69          ?+-------+-----------+-----------+------------+------------+  ?Left   1.12       0.98       1.11        1.24           ?+-------+-----------+-----------+------------+------------+ ?Right toe pressure equals 113 ?Left toe pressure is 135 ? ? ?MEDICAL ISSUES:  ? ?Right leg claudication: The patient's symptoms have remained stable over the course

## 2022-04-20 NOTE — Progress Notes (Signed)
Jasmin Jasmin Stephenson denies chest pain or shortness of breath. Patient denies having any s/s of Covid in her household.  Patient denies any known exposure to Covid.  ? ?Jasmin Stephenson's PCP is Caryl Never NP; cardiologist is Dr. Stanford Breed. Patient denies any palpations. ? ?I instructed  Jasmin Stephenson  to shower with antibacteria soap.  DO not shave. No nail polish, artificial or acrylic nails. Wear clean clothes, brush your teeth. ?Glasses, contact lens,dentures or partials may not be worn in the OR. If you need to wear them, please bring a case for glasses, do not wear contacts or bring a case, the hospital does not have contact cases, dentures or partials will have to be removed , make sure they are clean, we will provide a denture cup to put them in. You will need some one to drive you home and a responsible person over the age of 27 to stay with you for the first 24 hours after surgery.  ?

## 2022-04-21 ENCOUNTER — Encounter (HOSPITAL_COMMUNITY): Payer: Self-pay | Admitting: Obstetrics and Gynecology

## 2022-04-21 ENCOUNTER — Inpatient Hospital Stay (HOSPITAL_COMMUNITY)
Admission: RE | Admit: 2022-04-21 | Discharge: 2022-04-22 | DRG: 742 | Disposition: A | Discharge status: Home / Self Care | Payer: Medicaid Other | Attending: Obstetrics and Gynecology | Admitting: Obstetrics and Gynecology

## 2022-04-21 ENCOUNTER — Ambulatory Visit (HOSPITAL_COMMUNITY): Payer: Medicaid Other | Admitting: Physician Assistant

## 2022-04-21 ENCOUNTER — Other Ambulatory Visit: Payer: Self-pay

## 2022-04-21 ENCOUNTER — Encounter (HOSPITAL_COMMUNITY)
Admission: RE | Disposition: A | Discharge status: Home / Self Care | Payer: Self-pay | Source: Home / Self Care | Attending: Obstetrics and Gynecology

## 2022-04-21 DIAGNOSIS — R030 Elevated blood-pressure reading, without diagnosis of hypertension: Secondary | ICD-10-CM | POA: Diagnosis not present

## 2022-04-21 DIAGNOSIS — D251 Intramural leiomyoma of uterus: Secondary | ICD-10-CM

## 2022-04-21 DIAGNOSIS — F17210 Nicotine dependence, cigarettes, uncomplicated: Secondary | ICD-10-CM | POA: Diagnosis present

## 2022-04-21 DIAGNOSIS — Z90710 Acquired absence of both cervix and uterus: Secondary | ICD-10-CM | POA: Diagnosis present

## 2022-04-21 DIAGNOSIS — I252 Old myocardial infarction: Secondary | ICD-10-CM | POA: Diagnosis not present

## 2022-04-21 DIAGNOSIS — Z981 Arthrodesis status: Secondary | ICD-10-CM | POA: Diagnosis not present

## 2022-04-21 DIAGNOSIS — D62 Acute posthemorrhagic anemia: Secondary | ICD-10-CM | POA: Diagnosis present

## 2022-04-21 DIAGNOSIS — Z833 Family history of diabetes mellitus: Secondary | ICD-10-CM

## 2022-04-21 DIAGNOSIS — Z880 Allergy status to penicillin: Secondary | ICD-10-CM | POA: Diagnosis not present

## 2022-04-21 DIAGNOSIS — Z8249 Family history of ischemic heart disease and other diseases of the circulatory system: Secondary | ICD-10-CM | POA: Diagnosis not present

## 2022-04-21 DIAGNOSIS — Z803 Family history of malignant neoplasm of breast: Secondary | ICD-10-CM

## 2022-04-21 DIAGNOSIS — E785 Hyperlipidemia, unspecified: Secondary | ICD-10-CM | POA: Diagnosis present

## 2022-04-21 DIAGNOSIS — D259 Leiomyoma of uterus, unspecified: Principal | ICD-10-CM

## 2022-04-21 DIAGNOSIS — K219 Gastro-esophageal reflux disease without esophagitis: Secondary | ICD-10-CM | POA: Diagnosis present

## 2022-04-21 DIAGNOSIS — I70201 Unspecified atherosclerosis of native arteries of extremities, right leg: Secondary | ICD-10-CM | POA: Diagnosis present

## 2022-04-21 DIAGNOSIS — R110 Nausea: Secondary | ICD-10-CM | POA: Diagnosis not present

## 2022-04-21 DIAGNOSIS — D630 Anemia in neoplastic disease: Secondary | ICD-10-CM | POA: Diagnosis not present

## 2022-04-21 DIAGNOSIS — N920 Excessive and frequent menstruation with regular cycle: Secondary | ICD-10-CM

## 2022-04-21 HISTORY — PX: VAGINAL HYSTERECTOMY: SHX2639

## 2022-04-21 HISTORY — DX: Bipolar disorder, unspecified: F31.9

## 2022-04-21 HISTORY — DX: Personal history of other medical treatment: Z92.89

## 2022-04-21 HISTORY — DX: Peripheral vascular disease, unspecified: I73.9

## 2022-04-21 LAB — CBC
HCT: 31.7 % — ABNORMAL LOW (ref 36.0–46.0)
Hemoglobin: 9 g/dL — ABNORMAL LOW (ref 12.0–15.0)
MCH: 20.9 pg — ABNORMAL LOW (ref 26.0–34.0)
MCHC: 28.4 g/dL — ABNORMAL LOW (ref 30.0–36.0)
MCV: 73.7 fL — ABNORMAL LOW (ref 80.0–100.0)
Platelets: 432 10*3/uL — ABNORMAL HIGH (ref 150–400)
RBC: 4.3 MIL/uL (ref 3.87–5.11)
RDW: 19.9 % — ABNORMAL HIGH (ref 11.5–15.5)
WBC: 8.2 10*3/uL (ref 4.0–10.5)
nRBC: 0 % (ref 0.0–0.2)

## 2022-04-21 LAB — COMPREHENSIVE METABOLIC PANEL
ALT: 8 U/L (ref 0–44)
AST: 15 U/L (ref 15–41)
Albumin: 3.4 g/dL — ABNORMAL LOW (ref 3.5–5.0)
Alkaline Phosphatase: 70 U/L (ref 38–126)
Anion gap: 7 (ref 5–15)
BUN: 8 mg/dL (ref 6–20)
CO2: 23 mmol/L (ref 22–32)
Calcium: 8.5 mg/dL — ABNORMAL LOW (ref 8.9–10.3)
Chloride: 109 mmol/L (ref 98–111)
Creatinine, Ser: 1 mg/dL (ref 0.44–1.00)
GFR, Estimated: >60 mL/min (ref >60)
Glucose, Bld: 99 mg/dL (ref 70–99)
Potassium: 3.6 mmol/L (ref 3.5–5.1)
Sodium: 139 mmol/L (ref 135–145)
Total Bilirubin: 0.8 mg/dL (ref 0.3–1.2)
Total Protein: 6.5 g/dL (ref 6.5–8.1)

## 2022-04-21 LAB — SURGICAL PCR SCREEN
MRSA, PCR: NEGATIVE
Staphylococcus aureus: NEGATIVE

## 2022-04-21 LAB — POCT PREGNANCY, URINE: Preg Test, Ur: NEGATIVE

## 2022-04-21 LAB — PREPARE RBC (CROSSMATCH)

## 2022-04-21 SURGERY — HYSTERECTOMY, VAGINAL
Anesthesia: General | Site: Uterus | Laterality: Bilateral

## 2022-04-21 MED ORDER — IBUPROFEN 600 MG PO TABS
600.0000 mg | ORAL_TABLET | Freq: Four times a day (QID) | ORAL | Status: DC
  Administered 2022-04-22 (×2): 600 mg via ORAL
  Filled 2022-04-21 (×2): qty 1

## 2022-04-21 MED ORDER — ACETAMINOPHEN 325 MG PO TABS
650.0000 mg | ORAL_TABLET | Freq: Once | ORAL | Status: AC
  Administered 2022-04-21: 650 mg via ORAL
  Filled 2022-04-21: qty 2

## 2022-04-21 MED ORDER — MIDAZOLAM HCL 5 MG/5ML IJ SOLN
INTRAMUSCULAR | Status: DC | PRN
  Administered 2022-04-21: 2 mg via INTRAVENOUS

## 2022-04-21 MED ORDER — ACETAMINOPHEN 10 MG/ML IV SOLN
INTRAVENOUS | Status: AC
  Filled 2022-04-21: qty 100

## 2022-04-21 MED ORDER — LACTATED RINGERS IV SOLN: INTRAVENOUS | Status: DC

## 2022-04-21 MED ORDER — LIDOCAINE 2% (20 MG/ML) 5 ML SYRINGE
INTRAMUSCULAR | Status: AC
  Filled 2022-04-21: qty 5

## 2022-04-21 MED ORDER — POVIDONE-IODINE 10 % EX SWAB
2.0000 | Freq: Once | CUTANEOUS | Status: AC
Start: 2022-04-21 — End: 2022-04-21
  Administered 2022-04-21: 2 via TOPICAL

## 2022-04-21 MED ORDER — ROCURONIUM BROMIDE 10 MG/ML (PF) SYRINGE
PREFILLED_SYRINGE | INTRAVENOUS | Status: AC
  Filled 2022-04-21: qty 10

## 2022-04-21 MED ORDER — ROCURONIUM BROMIDE 10 MG/ML (PF) SYRINGE
PREFILLED_SYRINGE | INTRAVENOUS | Status: DC | PRN
  Administered 2022-04-21: 70 mg via INTRAVENOUS
  Administered 2022-04-21: 30 mg via INTRAVENOUS

## 2022-04-21 MED ORDER — ONDANSETRON HCL 4 MG PO TABS
8.0000 mg | ORAL_TABLET | Freq: Four times a day (QID) | ORAL | Status: DC | PRN
  Filled 2022-04-21: qty 2

## 2022-04-21 MED ORDER — DEXAMETHASONE SODIUM PHOSPHATE 10 MG/ML IJ SOLN
INTRAMUSCULAR | Status: AC
  Filled 2022-04-21: qty 1

## 2022-04-21 MED ORDER — ACETAMINOPHEN 500 MG PO TABS: 1000.0000 mg | ORAL_TABLET | Freq: Once | ORAL | Status: DC | PRN

## 2022-04-21 MED ORDER — CHLORHEXIDINE GLUCONATE 0.12 % MT SOLN
15.0000 mL | Freq: Once | OROMUCOSAL | Status: AC
  Administered 2022-04-21: 15 mL via OROMUCOSAL
  Filled 2022-04-21: qty 15

## 2022-04-21 MED ORDER — FENTANYL CITRATE (PF) 250 MCG/5ML IJ SOLN
INTRAMUSCULAR | Status: AC
  Filled 2022-04-21: qty 5

## 2022-04-21 MED ORDER — OXYCODONE HCL 5 MG PO TABS: 5.0000 mg | ORAL_TABLET | Freq: Once | ORAL | Status: DC | PRN

## 2022-04-21 MED ORDER — 0.9 % SODIUM CHLORIDE (POUR BTL) OPTIME
TOPICAL | Status: DC | PRN
Start: 2022-04-21 — End: 2022-04-21
  Administered 2022-04-21: 1000 mL

## 2022-04-21 MED ORDER — GENTAMICIN SULFATE 40 MG/ML IJ SOLN
5.0000 mg/kg | INTRAVENOUS | Status: AC
  Administered 2022-04-21: 410 mg via INTRAVENOUS
  Filled 2022-04-21: qty 10.25

## 2022-04-21 MED ORDER — ONDANSETRON HCL 4 MG PO TABS: 4.0000 mg | ORAL_TABLET | Freq: Four times a day (QID) | ORAL | Status: DC | PRN

## 2022-04-21 MED ORDER — AMISULPRIDE (ANTIEMETIC) 5 MG/2ML IV SOLN
10.0000 mg | Freq: Once | INTRAVENOUS | Status: AC
  Administered 2022-04-21: 10 mg via INTRAVENOUS

## 2022-04-21 MED ORDER — ACETAMINOPHEN 10 MG/ML IV SOLN: 1000.0000 mg | Freq: Once | INTRAVENOUS | Status: DC | PRN

## 2022-04-21 MED ORDER — FENTANYL CITRATE (PF) 100 MCG/2ML IJ SOLN
25.0000 ug | INTRAMUSCULAR | Status: DC | PRN
  Administered 2022-04-21 (×2): 25 ug via INTRAVENOUS

## 2022-04-21 MED ORDER — ACETAMINOPHEN 160 MG/5ML PO SOLN: 1000.0000 mg | Freq: Once | ORAL | Status: DC | PRN

## 2022-04-21 MED ORDER — OXYCODONE HCL 5 MG PO TABS
5.0000 mg | ORAL_TABLET | ORAL | Status: DC | PRN
  Administered 2022-04-21: 5 mg via ORAL
  Filled 2022-04-21: qty 1

## 2022-04-21 MED ORDER — FENTANYL CITRATE (PF) 100 MCG/2ML IJ SOLN
INTRAMUSCULAR | Status: AC
  Filled 2022-04-21: qty 2

## 2022-04-21 MED ORDER — SOD CITRATE-CITRIC ACID 500-334 MG/5ML PO SOLN
30.0000 mL | ORAL | Status: AC
  Administered 2022-04-21: 30 mL via ORAL
  Filled 2022-04-21: qty 30

## 2022-04-21 MED ORDER — ONDANSETRON HCL 4 MG/2ML IJ SOLN
INTRAMUSCULAR | Status: AC
  Filled 2022-04-21: qty 2

## 2022-04-21 MED ORDER — HYDROMORPHONE HCL 2 MG PO TABS: 1.0000 mg | ORAL_TABLET | Freq: Four times a day (QID) | ORAL | Status: DC | PRN

## 2022-04-21 MED ORDER — ACETAMINOPHEN 325 MG PO TABS: 650.0000 mg | ORAL_TABLET | ORAL | Status: DC | PRN

## 2022-04-21 MED ORDER — DIPHENHYDRAMINE HCL 50 MG/ML IJ SOLN
25.0000 mg | Freq: Once | INTRAMUSCULAR | Status: AC
  Administered 2022-04-21: 25 mg via INTRAVENOUS
  Filled 2022-04-21: qty 1
  Filled 2022-04-21: qty 0.5

## 2022-04-21 MED ORDER — LIDOCAINE HCL (CARDIAC) PF 100 MG/5ML IV SOSY
PREFILLED_SYRINGE | INTRAVENOUS | Status: DC | PRN
  Administered 2022-04-21: 40 mg via INTRAVENOUS

## 2022-04-21 MED ORDER — ENOXAPARIN SODIUM 40 MG/0.4ML IJ SOSY
40.0000 mg | PREFILLED_SYRINGE | INTRAMUSCULAR | Status: DC
  Administered 2022-04-22: 40 mg via SUBCUTANEOUS
  Filled 2022-04-21: qty 0.4

## 2022-04-21 MED ORDER — DOCUSATE SODIUM 100 MG PO CAPS
100.0000 mg | ORAL_CAPSULE | Freq: Two times a day (BID) | ORAL | Status: DC
  Administered 2022-04-21 – 2022-04-22 (×2): 100 mg via ORAL
  Filled 2022-04-21 (×2): qty 1

## 2022-04-21 MED ORDER — VANCOMYCIN HCL 1000 MG IV SOLR: INTRAVENOUS | Status: DC | PRN

## 2022-04-21 MED ORDER — KETOROLAC TROMETHAMINE 30 MG/ML IJ SOLN
30.0000 mg | Freq: Four times a day (QID) | INTRAMUSCULAR | Status: AC
  Administered 2022-04-21 – 2022-04-22 (×3): 30 mg via INTRAVENOUS
  Filled 2022-04-21 (×4): qty 1

## 2022-04-21 MED ORDER — SUGAMMADEX SODIUM 200 MG/2ML IV SOLN
INTRAVENOUS | Status: DC | PRN
  Administered 2022-04-21: 200 mg via INTRAVENOUS

## 2022-04-21 MED ORDER — NIFEDIPINE ER OSMOTIC RELEASE 30 MG PO TB24
30.0000 mg | ORAL_TABLET | Freq: Every day | ORAL | Status: DC
  Administered 2022-04-22: 30 mg via ORAL
  Filled 2022-04-21 (×2): qty 1

## 2022-04-21 MED ORDER — VANCOMYCIN HCL IN DEXTROSE 1-5 GM/200ML-% IV SOLN
1000.0000 mg | Freq: Once | INTRAVENOUS | Status: AC
  Administered 2022-04-21: 1000 mg via INTRAVENOUS
  Filled 2022-04-21: qty 200

## 2022-04-21 MED ORDER — PROPOFOL 10 MG/ML IV BOLUS
INTRAVENOUS | Status: DC | PRN
  Administered 2022-04-21: 110 mg via INTRAVENOUS

## 2022-04-21 MED ORDER — SODIUM CHLORIDE 0.9% IV SOLUTION: Freq: Once | INTRAVENOUS | Status: DC

## 2022-04-21 MED ORDER — FENTANYL CITRATE (PF) 100 MCG/2ML IJ SOLN
INTRAMUSCULAR | Status: DC | PRN
  Administered 2022-04-21 (×5): 50 ug via INTRAVENOUS

## 2022-04-21 MED ORDER — MIDAZOLAM HCL 2 MG/2ML IJ SOLN
INTRAMUSCULAR | Status: AC
  Filled 2022-04-21: qty 2

## 2022-04-21 MED ORDER — OXYCODONE HCL 5 MG/5ML PO SOLN: 5.0000 mg | Freq: Once | ORAL | Status: DC | PRN

## 2022-04-21 MED ORDER — ONDANSETRON HCL 4 MG/2ML IJ SOLN
4.0000 mg | Freq: Four times a day (QID) | INTRAMUSCULAR | Status: DC | PRN
  Administered 2022-04-21: 4 mg via INTRAVENOUS
  Filled 2022-04-21: qty 2

## 2022-04-21 MED ORDER — ORAL CARE MOUTH RINSE: 15.0000 mL | Freq: Once | OROMUCOSAL | Status: AC

## 2022-04-21 MED ORDER — PROPOFOL 10 MG/ML IV BOLUS
INTRAVENOUS | Status: AC
  Filled 2022-04-21: qty 20

## 2022-04-21 MED ORDER — SIMETHICONE 80 MG PO CHEW
80.0000 mg | CHEWABLE_TABLET | Freq: Four times a day (QID) | ORAL | Status: DC | PRN
Start: 2022-04-21 — End: 2022-04-23

## 2022-04-21 MED ORDER — DEXAMETHASONE SODIUM PHOSPHATE 4 MG/ML IJ SOLN
INTRAMUSCULAR | Status: DC | PRN
Start: 2022-04-21 — End: 2022-04-21
  Administered 2022-04-21: 10 mg via INTRAVENOUS

## 2022-04-21 MED ORDER — ACETAMINOPHEN 500 MG PO TABS
1000.0000 mg | ORAL_TABLET | ORAL | Status: AC
  Administered 2022-04-21: 1000 mg via ORAL
  Filled 2022-04-21: qty 2

## 2022-04-21 MED ORDER — KETOROLAC TROMETHAMINE 30 MG/ML IJ SOLN
INTRAMUSCULAR | Status: DC | PRN
  Administered 2022-04-21: 30 mg via INTRAVENOUS

## 2022-04-21 MED ORDER — SODIUM CHLORIDE 0.9 % IV SOLN
8.0000 mg | Freq: Four times a day (QID) | INTRAVENOUS | Status: DC | PRN
  Administered 2022-04-21: 8 mg via INTRAVENOUS
  Filled 2022-04-21 (×2): qty 4

## 2022-04-21 MED ORDER — SODIUM CHLORIDE 0.9 % IV SOLN
25.0000 mg | Freq: Four times a day (QID) | INTRAVENOUS | Status: DC | PRN
  Administered 2022-04-21: 25 mg via INTRAVENOUS
  Filled 2022-04-21 (×2): qty 1

## 2022-04-21 MED ORDER — AMISULPRIDE (ANTIEMETIC) 5 MG/2ML IV SOLN
INTRAVENOUS | Status: AC
Start: 2022-04-21 — End: 2022-04-21
  Filled 2022-04-21: qty 4

## 2022-04-21 SURGICAL SUPPLY — 23 items
BLADE SURG 10 STRL SS (BLADE) ×2 IMPLANT
CNTNR URN SCR LID CUP LEK RST (MISCELLANEOUS) IMPLANT
CONT SPEC 4OZ STRL OR WHT (MISCELLANEOUS)
GAUZE 4X4 16PLY ~~LOC~~+RFID DBL (SPONGE) ×4 IMPLANT
GLOVE BIO SURGEON STRL SZ7.5 (GLOVE) ×2 IMPLANT
GLOVE BIOGEL PI IND STRL 6.5 (GLOVE) ×1 IMPLANT
GLOVE BIOGEL PI INDICATOR 6.5 (GLOVE) ×1
GLOVE SURG ORTHO 8.0 STRL STRW (GLOVE) ×2 IMPLANT
GOWN STRL REUS W/ TWL LRG LVL3 (GOWN DISPOSABLE) ×3 IMPLANT
GOWN STRL REUS W/ TWL XL LVL3 (GOWN DISPOSABLE) ×1 IMPLANT
GOWN STRL REUS W/TWL LRG LVL3 (GOWN DISPOSABLE) ×6
GOWN STRL REUS W/TWL XL LVL3 (GOWN DISPOSABLE) ×2
HIBICLENS CHG 4% 4OZ BTL (MISCELLANEOUS) ×2 IMPLANT
NS IRRIG 1000ML POUR BTL (IV SOLUTION) ×2 IMPLANT
PACK VAGINAL WOMENS (CUSTOM PROCEDURE TRAY) ×2 IMPLANT
PAD OB MATERNITY 4.3X12.25 (PERSONAL CARE ITEMS) ×2 IMPLANT
SUT VIC AB 2-0 CT1 18 (SUTURE) ×2 IMPLANT
SUT VIC AB 2-0 CT1 27 (SUTURE) ×2
SUT VIC AB 2-0 CT1 TAPERPNT 27 (SUTURE) ×1 IMPLANT
SUT VIC AB PLUS 45CM 1-MO-4 (SUTURE) ×5 IMPLANT
SUT VICRYL 1 TIES 12X18 (SUTURE) ×2 IMPLANT
TOWEL GREEN STERILE FF (TOWEL DISPOSABLE) ×4 IMPLANT
TRAY FOLEY W/BAG SLVR 14FR LF (SET/KITS/TRAYS/PACK) ×2 IMPLANT

## 2022-04-21 NOTE — Transfer of Care (Signed)
Immediate Anesthesia Transfer of Care Note ? ?Patient: Jasmin Stephenson ? ?Procedure(s) Performed: VAGINAL HYSTERECTOMY, BILATERAL SALPINGECTOMY (Bilateral: Uterus) ? ?Patient Location: PACU ? ?Anesthesia Type:General ? ?Level of Consciousness: drowsy and patient cooperative ? ?Airway & Oxygen Therapy: Patient Spontanous Breathing and Patient connected to nasal cannula oxygen ? ?Post-op Assessment: Report given to RN and Post -op Vital signs reviewed and stable ? ?Post vital signs: Reviewed and stable ? ?Last Vitals:  ?Vitals Value Taken Time  ?BP 135/75 04/21/22 0942  ?Temp 37 ?C 04/21/22 0942  ?Pulse 68 04/21/22 0947  ?Resp 31 04/21/22 0947  ?SpO2 97 % 04/21/22 0947  ?Vitals shown include unvalidated device data. ? ?Last Pain:  ?Vitals:  ? 04/21/22 0614  ?TempSrc: Oral  ?   ? ?  ? ?Complications: No notable events documented. ?

## 2022-04-21 NOTE — Anesthesia Procedure Notes (Signed)
Procedure Name: Intubation ?Date/Time: 04/21/2022 7:40 AM ?Performed by: Terrence Dupont, CRNA ?Pre-anesthesia Checklist: Patient identified, Emergency Drugs available, Suction available and Patient being monitored ?Patient Re-evaluated:Patient Re-evaluated prior to induction ?Oxygen Delivery Method: Circle system utilized ?Preoxygenation: Pre-oxygenation with 100% oxygen ?Induction Type: IV induction ?Ventilation: Mask ventilation without difficulty ?Laryngoscope Size: Mac and 3 ?Grade View: Grade II ?Tube type: Oral ?Tube size: 7.0 mm ?Number of attempts: 1 ?Airway Equipment and Method: Stylet and Oral airway ?Placement Confirmation: ETT inserted through vocal cords under direct vision, positive ETCO2 and breath sounds checked- equal and bilateral ?Tube secured with: Tape ?Dental Injury: Teeth and Oropharynx as per pre-operative assessment  ? ? ? ? ?

## 2022-04-21 NOTE — Op Note (Signed)
Jasmin Stephenson ?PROCEDURE DATE: 04/21/2022 ? ?PREOPERATIVE DIAGNOSIS:  Symptomatic fibroids, menorrhagia ?POSTOPERATIVE DIAGNOSIS:  Symptomatic fibroids, menorrhagia ?SURGEON:   Lynnda Shields M.D., [place] ?ASSISTANT: Margie Ege, M.D. ?OPERATION:  Total Vaginal hysterectomy , bilateral salpingectomy ?ANESTHESIA:  General endotracheal. ? ?INDICATIONS: The patient is a 51 y.o. [id number] with history of symptomatic uterine fibroids/menorrhagia. The patient made a decision to undergo definite surgical treatment. On the preoperative visit, the risks, benefits, indications, and alternatives of the procedure were reviewed with the patient.  On the day of surgery, the risks of surgery were again discussed with the patient including but not limited to: bleeding which may require transfusion or reoperation; infection which may require antibiotics; injury to bowel, bladder, ureters or other surrounding organs; need for additional procedures; thromboembolic phenomenon, incisional problems and other postoperative/anesthesia complications. Written informed consent was obtained.   ? ?An experienced assistant was required given the standard of surgical care given the complexity of the case.  This assistant was needed for exposure, dissection, suctioning, retraction, instrument exchange,  and for overall help during the procedure. ? ?OPERATIVE FINDINGS: A 12 week size uterus with normal tubes and ovaries bilaterally. ? ?ESTIMATED BLOOD LOSS: 500 ml ?FLUIDS:  900 ml of Lactated Ringers ?URINE OUTPUT:900 ml of clear yellow urine. ?SPECIMENS:  Uterus and cervix and bilateral tubal segments sent to pathology ?COMPLICATIONS:  None immediate. ? ?DESCRIPTION OF PROCEDURE:  The patient received intravenous antibiotics and had sequential compression devices applied to her lower extremities while in the preoperative area.  She was then taken to the operating room where general anesthesia was administered and was found to be adequate.  She was  placed in the dorsal lithotomy position, and was prepped and draped in a sterile manner.  The patients bladder was drained with a foley catheter which was left in place. After an adequate timeout was performed, attention was turned to her pelvis.  A weighted speculum was then placed in the vagina, and the anterior and posterior lips of the cervix were grasped bilaterally with tenaculums.   The cervix was then circumferentially incised, and the bladder was dissected off the pubocervical fascia without complication.  Th posterior cul-de-sac was entered sharply without difficulty. A suture was placed on the posterior vagina.  A long weighted speculum was inserted into the posterior cul-de-sac.  The Heaney clamp was then used to clamp the uterosacral ligaments on either side.  They were then cut and sutured ligated with 0 Vicryl, and the ligated uterosacral ligaments were transfixed to the posterior lateral vaginal epithelium to further support the vagina and provide hemostasis. Of note, all sutures used in this case were 0 Vicryl unless otherwise noted.   The cardinal ligaments were then clamped, cut and ligated. The anterior cul-de-sac was then entered sharply after the bladder was safely dissected away. The uterine vessels and broad ligaments were then serially clamped with the Heaney clamps, cut, and suture ligated on both sides.  Excellent hemostasis was noted at this point.  Due to the size of the uterus, it was morcellated using a coring technique.  The uterus was then delivered via the posterior cul-de-sac, and the cornua were clamped with the Heaney clamps, transected, and the uterus was delivered and sent to pathology. These pedicles were then suture ligated to ensure hemostasis.  After completion of the hysterectomy, The fallopian tube on the left side was grasped with a Kelly clamp, transected and suture ligated. Procedure was repeated on the patient's right side.  All pedicles from the  uterosacral ligament  to the cornua were examined hemostasis was confirmed.  Potential space in the peritoneum was then ligated. The vaginal cuff was reefed in a running locked fashion then reapproximated using figure of eight sutures care was given to incorporate the uterosacral pedicles bilaterally.  All instruments were then removed from the pelvis.  The patient tolerated the procedure well.  All instruments, needles, and sponge counts were correct x 2. The patient was taken to the recovery room in stable condition.   ?

## 2022-04-21 NOTE — Interval H&P Note (Signed)
History and Physical Interval Note: ? ?04/21/2022 ?7:21 AM ? ?Jasmin Stephenson  has presented today for surgery, with the diagnosis of Fibroid ?Menorrhagia.  The various methods of treatment have been discussed with the patient and family. After consideration of risks, benefits and other options for treatment, the patient has consented to  Procedure(s): ?HYSTERECTOMY VAGINAL AND POSSIBLE SALPINGECTOMY (Bilateral) as a surgical intervention.  The patient's history has been reviewed, patient examined, no change in status, stable for surgery.  I have reviewed the patient's chart and labs.  Questions were answered to the patient's satisfaction.   ? ? ?Griffin Basil ? ? ?

## 2022-04-21 NOTE — Anesthesia Postprocedure Evaluation (Signed)
Anesthesia Post Note ? ?Patient: LAMARIA HILDEBRANDT ? ?Procedure(s) Performed: VAGINAL HYSTERECTOMY, BILATERAL SALPINGECTOMY (Bilateral: Uterus) ? ?  ? ?Patient location during evaluation: PACU ?Anesthesia Type: General ?Level of consciousness: patient cooperative and awake ?Pain management: pain level controlled ?Vital Signs Assessment: post-procedure vital signs reviewed and stable ?Respiratory status: spontaneous breathing, nonlabored ventilation, respiratory function stable and patient connected to nasal cannula oxygen ?Cardiovascular status: blood pressure returned to baseline and stable ?Anesthetic complications: no ? ? ?No notable events documented. ? ?Last Vitals:  ?Vitals:  ? 04/21/22 1209 04/21/22 1407  ?BP: 122/76 (!) 144/98  ?Pulse: 70 65  ?Resp: 18 14  ?Temp: 36.5 ?C 36.9 ?C  ?SpO2: 99% 100%  ?  ?Last Pain:  ?Vitals:  ? 04/21/22 1407  ?TempSrc: Oral  ?PainSc:   ? ? ?  ?  ?  ?  ?  ?  ? ?Marinus Eicher ? ? ? ? ?

## 2022-04-21 NOTE — TOC CM/SW Note (Signed)
S/p Total Vaginal hysterectomy , bilateral salpingectomy ? ? ?Transition of Care (TOC) Screening Note ? ? ?Patient Details  ?Name: Jasmin Stephenson ?Date of Birth: March 06, 1971 ? ? ? ? ? ?Transition of Care Department [facility]) has reviewed patient and no TOC needs have been identified at this time. We will continue to monitor patient advancement through interdisciplinary progression rounds. If new patient transition needs arise, please place a TOC consult. ?  ?

## 2022-04-22 ENCOUNTER — Encounter (HOSPITAL_COMMUNITY): Payer: Self-pay | Admitting: Obstetrics and Gynecology

## 2022-04-22 DIAGNOSIS — N920 Excessive and frequent menstruation with regular cycle: Secondary | ICD-10-CM | POA: Diagnosis not present

## 2022-04-22 DIAGNOSIS — D259 Leiomyoma of uterus, unspecified: Secondary | ICD-10-CM | POA: Diagnosis not present

## 2022-04-22 LAB — CBC
HCT: 35.4 % — ABNORMAL LOW (ref 36.0–46.0)
Hemoglobin: 11.2 g/dL — ABNORMAL LOW (ref 12.0–15.0)
MCH: 23.5 pg — ABNORMAL LOW (ref 26.0–34.0)
MCHC: 31.6 g/dL (ref 30.0–36.0)
MCV: 74.4 fL — ABNORMAL LOW (ref 80.0–100.0)
Platelets: 371 10*3/uL (ref 150–400)
RBC: 4.76 MIL/uL (ref 3.87–5.11)
RDW: 21.4 % — ABNORMAL HIGH (ref 11.5–15.5)
WBC: 23.8 10*3/uL — ABNORMAL HIGH (ref 4.0–10.5)
nRBC: 0 % (ref 0.0–0.2)

## 2022-04-22 LAB — TYPE AND SCREEN
ABO/RH(D): A POS
Antibody Screen: NEGATIVE
Unit division: 0
Unit division: 0

## 2022-04-22 LAB — BPAM RBC
Blood Product Expiration Date: 202306072359
Blood Product Expiration Date: 202306072359
ISSUE DATE / TIME: 202305091355
ISSUE DATE / TIME: 202305091644
Unit Type and Rh: 6200
Unit Type and Rh: 6200

## 2022-04-22 LAB — SURGICAL PATHOLOGY

## 2022-04-22 MED ORDER — NIFEDIPINE ER 30 MG PO TB24: 30.0000 mg | ORAL_TABLET | Freq: Every day | ORAL | 1 refills | Status: DC

## 2022-04-22 MED ORDER — IBUPROFEN 600 MG PO TABS: 600.0000 mg | ORAL_TABLET | Freq: Four times a day (QID) | ORAL | 2 refills | Status: DC

## 2022-04-22 MED ORDER — ASPIRIN EC 81 MG PO TBEC
81.0000 mg | DELAYED_RELEASE_TABLET | Freq: Every day | ORAL | Status: DC
  Administered 2022-04-22: 81 mg via ORAL
  Filled 2022-04-22: qty 1

## 2022-04-22 MED ORDER — ALBUTEROL SULFATE (2.5 MG/3ML) 0.083% IN NEBU: 2.5000 mg | INHALATION_SOLUTION | Freq: Four times a day (QID) | RESPIRATORY_TRACT | Status: DC | PRN

## 2022-04-22 MED ORDER — CHLORHEXIDINE GLUCONATE CLOTH 2 % EX PADS
6.0000 | MEDICATED_PAD | Freq: Every day | CUTANEOUS | Status: DC
  Administered 2022-04-22: 6 via TOPICAL

## 2022-04-22 MED ORDER — ALBUTEROL SULFATE HFA 108 (90 BASE) MCG/ACT IN AERS: 2.0000 | INHALATION_SPRAY | RESPIRATORY_TRACT | Status: DC | PRN

## 2022-04-22 MED ORDER — POLYMYXIN B-TRIMETHOPRIM 10000-0.1 UNIT/ML-% OP SOLN
2.0000 [drp] | OPHTHALMIC | Status: DC
  Filled 2022-04-22 (×2): qty 10

## 2022-04-22 MED ORDER — ROSUVASTATIN CALCIUM 5 MG PO TABS
10.0000 mg | ORAL_TABLET | Freq: Every day | ORAL | Status: DC
  Administered 2022-04-22: 10 mg via ORAL
  Filled 2022-04-22: qty 2

## 2022-04-22 NOTE — Discharge Summary (Signed)
Physician Discharge Summary  ?Patient ID: ?Jasmin Stephenson ?MRN: [id number] ?DOB/AGE: August 29, 1971 51 y.o. ? ?Admit date: 04/21/2022 ?Discharge date: 04/22/2022 ? ?Admission Diagnoses:Menorrhagia ? ?Discharge Diagnoses:  ?Principal Problem: ?  Menorrhagia with regular cycle ?Active Problems: ?  S/P vaginal hysterectomy ? ? ?Discharged Condition: good ? ?Hospital Course: Pt admitted for scheduled vaginal hysterectomy.  Please see separate operative note. DOS pt had elevated blood pressure and was started on procardia XL 30 mg.   Pt seen post op day 1 with moderate  nausea and discomfort.  Pt attributed nausea to  her oxycodone.  Pt re-evaluated in the afternoon and was doing much better only using ibuprofen for pain control. Pt had met all conditions for discharge and asked to be discharged the evening of POD 1. ? ?Consults: None ? ?Significant Diagnostic Studies: n/a ? ?Treatments: blood transfusion 2 units ? ?Discharge Exam: ?Blood pressure 134/80, pulse 72, temperature (!) 97.3 ?F (36.3 ?C), temperature source Oral, resp. rate 18, height 5' 5"  (1.651 m), weight 81.6 kg, last menstrual period 03/27/2022, SpO2 97 %. ?General appearance: alert, cooperative, and no distress ?Head: Normocephalic, without obvious abnormality, atraumatic ?Resp: clear to auscultation bilaterally ?Cardio: regular rate and rhythm ?GI: soft, non-tender; bowel sounds normal; no masses,  no organomegaly ?Extremities: extremities normal, atraumatic, no cyanosis or edema and Homans sign is negative, no sign of DVT ? ?Disposition: Discharge disposition: 01-Home or Self Care ? ? ? ? ? ? ?Discharge Instructions   ? ? Call MD for:  difficulty breathing, headache or visual disturbances   Complete by: As directed ?  ? Call MD for:  persistant dizziness or light-headedness   Complete by: As directed ?  ? Call MD for:  persistant nausea and vomiting   Complete by: As directed ?  ? Call MD for:  severe uncontrolled pain   Complete by: As directed ?  ? Call MD  for:  temperature >100.4   Complete by: As directed ?  ? Diet - low sodium heart healthy   Complete by: As directed ?  ? Driving Restrictions   Complete by: As directed ?  ? No driving for 3-4 days  ? Increase activity slowly   Complete by: As directed ?  ? Lifting restrictions   Complete by: As directed ?  ? No lifting more than 20 pounds for 2 weeks  ? Sexual Activity Restrictions   Complete by: As directed ?  ? Pelvic rest 6-8 weeks  ? ?  ? ?Allergies as of 04/22/2022   ? ?   Reactions  ? Penicillins Nausea And Vomiting, Rash  ? Has patient had a PCN reaction causing immediate rash, facial/tongue/throat swelling, SOB or lightheadedness with hypotension: Yes ?Has patient had a PCN reaction causing severe rash involving mucus membranes or skin necrosis: Yes ?Has patient had a PCN reaction that required hospitalization No ?Has patient had a PCN reaction occurring within the last 10 years: No ?If all of the above answers are "NO", then may proceed with Cephalosporin use.  ? ?  ? ?  ?Medication List  ?  ? ?STOP taking these medications   ? ?ciclopirox 8 % solution ?Commonly known as: PENLAC ?  ?cilostazol 100 MG tablet ?Commonly known as: PLETAL ?  ?diphenhydrAMINE 50 MG tablet ?Commonly known as: BENADRYL ?  ?docusate sodium 100 MG capsule ?Commonly known as: COLACE ?  ?ferrous sulfate 324 (65 Fe) MG Tbec ?  ?fluticasone 50 MCG/ACT nasal spray ?Commonly known as: FLONASE ?  ?ondansetron 4  MG tablet ?Commonly known as: Zofran ?  ? ?  ? ?TAKE these medications   ? ?acetaminophen 500 MG tablet ?Commonly known as: TYLENOL ?Take 500 mg by mouth every 6 (six) hours as needed for moderate pain. ?  ?albuterol (2.5 MG/3ML) 0.083% nebulizer solution ?Commonly known as: PROVENTIL ?Take 3 mLs (2.5 mg total) by nebulization every 6 (six) hours as needed for wheezing or shortness of breath. ?  ?albuterol 108 (90 Base) MCG/ACT inhaler ?Commonly known as: VENTOLIN HFA ?Inhale 2 puffs into the lungs every 4 (four) hours as needed for  wheezing or shortness of breath (cough, shortness of breath or wheezing.). ?  ?aspirin EC 81 MG tablet ?Take 1 tablet (81 mg total) by mouth daily. ?  ?ibuprofen 600 MG tablet ?Commonly known as: ADVIL ?Take 1 tablet (600 mg total) by mouth every 6 (six) hours. ?Start taking on: Apr 23, 2022 ?  ?ketoconazole 2 % shampoo ?Commonly known as: NIZORAL ?Apply 1 application. topically 2 (two) times a week. ?  ?NIFEdipine 30 MG 24 hr tablet ?Commonly known as: ADALAT CC ?Take 1 tablet (30 mg total) by mouth daily. ?Start taking on: Apr 23, 2022 ?  ?rosuvastatin 10 MG tablet ?Commonly known as: Crestor ?Take 1 tablet (10 mg total) by mouth daily. ?  ?triamcinolone cream 0.1 % ?Commonly known as: KENALOG ?Apply 1 application topically 2 (two) times daily. ?What changed:  ?when to take this ?reasons to take this ?  ?trimethoprim-polymyxin b ophthalmic solution ?Commonly known as: POLYTRIM ?Place 2 drops into the right eye every 4 (four) hours. ?  ?Vitamin D (Ergocalciferol) 1.25 MG (50000 UNIT) Caps capsule ?Commonly known as: DRISDOL ?Take 1 capsule (50,000 Units total) by mouth every 7 (seven) days. ?  ? ?  ? ? Follow-up Information   ? ? [facility] Follow up in 1 month(s).   ?Specialty: Obstetrics and Gynecology ?Why: schedule with Dr. Elgie Congo for post op ?Contact information: ?[address] ?[place] ?[phone] ? ?  ?  ? ?  ?  ? ?  ? ? ?Signed: ?Griffin Basil ?04/22/2022, 6:10 PM ? ? ?

## 2022-04-22 NOTE — Progress Notes (Signed)
Patient slept for majority of this shift. While awake the patient dry heaved continuously. Patient medicated with antiemetics as per PRN orders with positive effect. Patient blood pressure lowered without additional medication. Patient unable to tolerate anything PO at this time. Family remained at bedside all night. IV fluids infusing. Will continue to monitor. ?

## 2022-04-22 NOTE — Progress Notes (Signed)
Gynecology Progress Note  ?Admission Date: 04/21/2022 ?Current Date: 04/22/2022 ?8:57 AM ? ?Jasmin Stephenson is a 51 y.o. [id number] HD#1 admitted for [facility]  ? ?History complicated by: ?Patient Active Problem List  ? Diagnosis Date Noted  ? S/P vaginal hysterectomy 04/21/2022  ? Uterine leiomyoma 05/26/2021  ? Fibroid uterus 02/25/2021  ? No-show for appointment 02/19/2021  ? Scalp pruritus 02/18/2021  ? 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 Menorrhagia with regular cycle 01/23/2021  ? Women's annual routine gynecological examination 01/23/2021  ? History of MI (myocardial infarction) 01/23/2021  ? Tinea corporis 01/25/2019  ? Cervical radiculopathy 09/21/2018  ? History of fusion of cervical spine 09/21/2018  ? Numbness and tingling 09/21/2018  ? Neck pain 09/02/2018  ? Cervical disc disorder at C6-C7 level with radiculopathy 09/21/2016  ? Hair loss 01/16/2016  ? Pain in joint, ankle and foot 01/16/2016  ? ? ?ROS and patient/family/surgical history, located on admission H&P note dated 04/21/2022, have been reviewed, and there are no changes except as noted below ?Yesterday/Overnight Events:  ?Pt received 2 units of PRBC due to low starting h/h and surgical blood loss.  Increased pain and nausea last evening.  Procardia added due to elevated BP.  Order placed for foley removal.  Nursing did not remove catheter as ordered.  No MD contact made. ? ?Subjective:  ?Pt seen this AM.  Complains of continued nausea and hasn't been able to eat.  Pt has not been to restroom as catheter is still in place.  Pt does note positive flatus.  Pt has completed her blood transfusion.  Pt states she thinks her pain medication is too strong and may be contributing to the nausea. ? ?Objective:  ? ?Vitals:  ? 04/21/22 1924 04/21/22 2101 04/22/22 0009 04/22/22 0519  ?BP: (!) 170/110 (!) 169/89 (!) 149/87 138/73  ?Pulse: (!) 50 (!) 56 (!) 51 (!) 57  ?Resp:  '18 18 16  '$ ?Temp:  98.5 ?F (36.9 ?C) 98.4 ?F (36.9  ?C) 98.1 ?F (36.7 ?C)  ?TempSrc:  Oral Oral Oral  ?SpO2:  100% 100% 99%  ?Weight:      ?Height:      ? ? ?Temp:  [97.5 ?F (36.4 ?C)-98.6 ?F (37 ?C)] 98.1 ?F (36.7 ?C) (05/10 0519) ?Pulse Rate:  [50-74] 57 (05/10 0519) ?Resp:  [11-25] 16 (05/10 0519) ?BP: (114-172)/(73-110) 138/73 (05/10 0519) ?SpO2:  [91 %-100 %] 99 % (05/10 0519) ?I/O last 3 completed shifts: ?In: 2194.3 [I.V.:1300; Blood:680; IV Piggyback:214.3] ?Out: 3625 [YTKPT:4656; Blood:500] ?No intake/output data recorded. ? ?Intake/Output Summary (Last 24 hours) at 04/22/2022 0857 ?Last data filed at 04/22/2022 [id number] ?Gross per 24 hour  ?Intake 2084 ml  ?Output 2475 ml  ?Net -391 ml  ? ? ? Current Vital Signs 24h Vital Sign Ranges  ?T 98.1 ?F (36.7 ?C) Temp  Avg: 98.3 ?F (36.8 ?C)  Min: 97.5 ?F (36.4 ?C)  Max: 98.6 ?F (37 ?C)  ?BP 138/73 BP  Min: 114/76  Max: 172/90  ?HR (!) 57 ? Pulse  Avg: 63.5  Min: 50  Max: 74  ?RR 16 Resp  Avg: 18.7  Min: 11  Max: 25  ?SaO2 99 % Room Air SpO2  Avg: 97.2 %  Min: 91 %  Max: 100 %  ?    ? 24 Hour I/O Current Shift I/O  ?Time ?Ins ?Outs 05/09 0701 - 05/10 0700 ?In: 2194.3 [I.V.:1300] ?Out: 3625 [Urine:3125] No intake/output data recorded.  ? ?Patient Vitals for the  past 12 hrs: ? BP Temp Temp src Pulse Resp SpO2  ?04/22/22 0519 138/73 98.1 ?F (36.7 ?C) Oral (!) 57 16 99 %  ?04/22/22 0009 (!) 149/87 98.4 ?F (36.9 ?C) Oral (!) 51 18 100 %  ?04/21/22 2101 (!) 169/89 98.5 ?F (36.9 ?C) Oral (!) 56 18 100 %  ? ? ? ?Patient Vitals for the past 24 hrs: ? BP Temp Temp src Pulse Resp SpO2  ?04/22/22 0519 138/73 98.1 ?F (36.7 ?C) Oral (!) 57 16 99 %  ?04/22/22 0009 (!) 149/87 98.4 ?F (36.9 ?C) Oral (!) 51 18 100 %  ?04/21/22 2101 (!) 169/89 98.5 ?F (36.9 ?C) Oral (!) 56 18 100 %  ?04/21/22 1924 (!) 170/110 -- -- (!) 50 -- --  ?04/21/22 1723 (!) 152/104 98.4 ?F (36.9 ?C) Oral 68 18 100 %  ?04/21/22 1657 (!) 172/90 98.2 ?F (36.8 ?C) Oral 60 18 100 %  ?04/21/22 1627 (!) 160/85 (!) 97.5 ?F (36.4 ?C) Oral 64 18 100 %  ?04/21/22 1424 (!)  155/101 98.6 ?F (37 ?C) Oral 61 15 100 %  ?04/21/22 1407 (!) 144/98 98.5 ?F (36.9 ?C) Oral 65 14 100 %  ?04/21/22 1209 122/76 97.7 ?F (36.5 ?C) Oral 70 18 99 %  ?04/21/22 1148 114/76 -- -- 74 (!) 24 92 %  ?04/21/22 1142 130/83 98.6 ?F (37 ?C) -- 73 (!) 21 92 %  ?04/21/22 1127 139/85 -- -- 66 (!) 22 92 %  ?04/21/22 1112 (!) 151/86 -- -- 66 (!) 22 91 %  ?04/21/22 1057 (!) 144/75 -- -- 64 (!) 24 93 %  ?04/21/22 1047 -- -- -- 62 (!) 22 97 %  ?04/21/22 1042 (!) 151/88 -- -- 68 (!) 25 98 %  ?04/21/22 1027 (!) 141/76 -- -- 67 14 98 %  ?04/21/22 1012 129/81 -- -- 63 11 99 %  ?04/21/22 0957 138/80 -- -- 66 20 94 %  ?04/21/22 0942 135/75 98.6 ?F (37 ?C) -- 62 16 99 %  ? ? ?Physical exam: ?General appearance: alert, cooperative, appears stated age, mild distress, and pt appears moderately uncomfortable. ?Abdomen: soft, non-tender; bowel sounds normal; no masses,  no organomegaly ?GU: No gross VB ?Lungs: clear to auscultation bilaterally ?Heart: regular rate and rhythm ?Extremities: no lower extremity edema ?Skin: WNL ?Psych: appropriate ?Neurologic: Grossly normal ? ?Medications ?Current Facility-Administered Medications  ?Medication Dose Route Frequency Provider Last Rate Last Admin  ? 0.9 %  sodium chloride infusion (Manually program via Guardrails IV Fluids)   Intravenous Once Griffin Basil, MD      ? acetaminophen (TYLENOL) tablet 650 mg  650 mg Oral Q4H PRN Griffin Basil, MD      ? Chlorhexidine Gluconate Cloth 2 % PADS 6 each  6 each Topical Daily Griffin Basil, MD      ? docusate sodium (COLACE) capsule 100 mg  100 mg Oral BID Griffin Basil, MD   100 mg at 04/21/22 1417  ? enoxaparin (LOVENOX) injection 40 mg  40 mg Subcutaneous Q24H Griffin Basil, MD      ? HYDROmorphone (DILAUDID) tablet 1 mg  1 mg Oral Q6H PRN Griffin Basil, MD      ? ketorolac (TORADOL) 30 MG/ML injection 30 mg  30 mg Intravenous Q6H Griffin Basil, MD   30 mg at 04/22/22 0557  ? Followed by  ? ibuprofen (ADVIL) tablet 600 mg   600 mg Oral Q6H Griffin Basil, MD      ? lactated  ringers infusion   Intravenous Continuous Griffin Basil, MD      ? NIFEdipine (PROCARDIA-XL/NIFEDICAL-XL) 24 hr tablet 30 mg  30 mg Oral Daily Griffin Basil, MD      ? ondansetron [facility]) tablet 8 mg  8 mg Oral Q6H PRN Griffin Basil, MD      ? Or  ? ondansetron (ZOFRAN) 8 mg in sodium chloride 0.9 % 50 mL IVPB  8 mg Intravenous Q6H PRN Griffin Basil, MD   Stopped at 04/21/22 2240  ? oxyCODONE (Oxy IR/ROXICODONE) immediate release tablet 5-10 mg  5-10 mg Oral Q4H PRN Griffin Basil, MD   5 mg at 04/21/22 1432  ? promethazine (PHENERGAN) 25 mg in sodium chloride 0.9 % 50 mL IVPB  25 mg Intravenous Q6H PRN Griffin Basil, MD   Stopped at 04/21/22 1938  ? simethicone (MYLICON) chewable tablet 80 mg  80 mg Oral QID PRN Griffin Basil, MD      ? ? ? ? ?Labs  ?Recent Labs  ?Lab 04/21/22 ?0620 04/22/22 ?5170  ?WBC 8.2 23.8*  ?HGB 9.0* 11.2*  ?HCT 31.7* 35.4*  ?PLT 432* 371  ? ? ?Recent Labs  ?Lab 04/21/22 ?0620  ?NA 139  ?K 3.6  ?CL 109  ?CO2 23  ?BUN 8  ?CREATININE 1.00  ?CALCIUM 8.5*  ?PROT 6.5  ?BILITOT 0.8  ?ALKPHOS 70  ?ALT 8  ?AST 15  ?GLUCOSE 99  ? ? ?Radiology ?N/a ? ?Assessment & Plan:  ?POD 1 ?Pt continues to have some issues with pain control and nausea. ?Pt advised to try utilizing ibuprofen as first line therapy and use oxycodone for breakthrough pain only. ?Remove foley ?Pt advise to ambulate and get out of bed.  ?Advance diet as tolerated, nausea meds PRN ?Will reassess in the afternoon ? ?Code Status: Full Code ? ? ?Lynnda Shields, MD ?Attending ?Center for [employer]) ?   ?

## 2022-04-23 ENCOUNTER — Telehealth: Payer: Self-pay

## 2022-04-23 NOTE — Telephone Encounter (Signed)
Transition Care Management Unsuccessful Follow-up Telephone Call ? ?Date of discharge and from where:  04/22/2022-[place]  ? ?Attempts:  1st Attempt ? ?Reason for unsuccessful TCM follow-up call:  Unable to leave message ? ?  ?

## 2022-04-24 NOTE — Telephone Encounter (Signed)
Transition Care Management Unsuccessful Follow-up Telephone Call ? ?Date of discharge and from where:  04/22/2022-[place]  ? ?Attempts:  2nd Attempt ? ?Reason for unsuccessful TCM follow-up call:  Unable to leave message ? ? ? ?

## 2022-04-28 NOTE — Telephone Encounter (Signed)
Transition Care Management Unsuccessful Follow-up Telephone Call ? ?Date of discharge and from where:  04/22/2022-[place]  ? ?Attempts:  3rd Attempt ? ?Reason for unsuccessful TCM follow-up call:  Unable to leave message ? ?  ?

## 2022-05-15 ENCOUNTER — Other Ambulatory Visit: Payer: Self-pay | Admitting: Obstetrics and Gynecology

## 2022-05-30 ENCOUNTER — Other Ambulatory Visit: Payer: Self-pay | Admitting: Obstetrics and Gynecology

## 2022-06-01 ENCOUNTER — Ambulatory Visit (INDEPENDENT_AMBULATORY_CARE_PROVIDER_SITE_OTHER): Payer: Medicaid Other | Admitting: Obstetrics and Gynecology

## 2022-06-01 ENCOUNTER — Encounter: Payer: Self-pay | Admitting: Obstetrics and Gynecology

## 2022-06-01 VITALS — BP 162/104 | HR 66 | Ht 65.0 in | Wt 179.8 lb

## 2022-06-01 DIAGNOSIS — Z1231 Encounter for screening mammogram for malignant neoplasm of breast: Secondary | ICD-10-CM | POA: Diagnosis not present

## 2022-06-01 DIAGNOSIS — Z4889 Encounter for other specified surgical aftercare: Secondary | ICD-10-CM | POA: Diagnosis not present

## 2022-06-01 NOTE — Progress Notes (Signed)
Pt presented for f/u after hysterectomy. Pt states her recovery has been good, no complaints at this time.   Pt is requesting medication for low libido.

## 2022-06-01 NOTE — Progress Notes (Addendum)
    Subjective:    Jasmin Stephenson is a 51 y.o. female who presents to the clinic status post TVH on 04/21/22. The patient is not having any pain.  Eating a regular diet without difficulty. Bowel movements are normal. No other significant postoperative concerns.  The following portions of the patient's history were reviewed and updated as appropriate: allergies, current medications, past family history, past medical history, past social history, past surgical history, and problem list..  Last pap smear was normal on 01/23/21.  Pt notes no hx of abnl pap, she will no longer need pap smears.  Review of Systems Pertinent items are noted in HPI.   Objective:   BP (!) 162/104   Pulse 66   Ht '5\' 5"'$  (1.651 m)   Wt 179 lb 12.8 oz (81.6 kg)   LMP 03/27/2022 Comment: DOS UPREG NEGATIVE  BMI 29.92 kg/m  Constitutional:  Well-developed, well-nourished female in no acute distress.   Skin: Skin is warm and dry, no rash noted, not diaphoretic,no erythema, no pallor.  Cardiovascular: Normal heart rate noted  Respiratory: Effort and breath sounds normal, no problems with respiration noted  Abdomen: Soft, bowel sounds active, non-tender, no abnormal masses  Incision: Healing well, no drainage, no erythema, no hernia, no seroma, no swelling, no dehiscence, incision well approximated  Pelvic:   Normal appearing external genitalia; normal appearing vaginal mucosa and well-healing vaginal cuff with sutures visible.  No abnormal discharge noted.   Surgical pathology () Uterus with fibroid, no malignancy Assessment:   Doing well postoperatively.  Operative findings again reviewed. Pathology report discussed.   Plan:   1. Continue any current medications. 2. Wound care discussed. 3. Activity restrictions: none 4. Anticipated return to work: now. 5. Follow up as needed 6.  Routine preventative health maintenance measures emphasized. Please refer to After Visit Summary for other counseling  recommendations.  7.  Pt may resume intercourse in 1-2 weeks. 8. Pt will be referred back to internal medicine  regarding her blood pressure as well as restarting her procardia as previously prescribed.   Lynnda Shields, MD, [place] Attending [place] for [facility], [place]

## 2022-06-08 ENCOUNTER — Ambulatory Visit
Admission: RE | Admit: 2022-06-08 | Discharge: 2022-06-08 | Disposition: A | Discharge status: Home / Self Care | Payer: Medicaid Other | Source: Ambulatory Visit | Attending: Obstetrics and Gynecology | Admitting: Obstetrics and Gynecology

## 2022-06-08 DIAGNOSIS — Z1231 Encounter for screening mammogram for malignant neoplasm of breast: Secondary | ICD-10-CM

## 2022-06-10 ENCOUNTER — Other Ambulatory Visit: Payer: Self-pay | Admitting: Obstetrics and Gynecology

## 2022-06-10 DIAGNOSIS — R928 Other abnormal and inconclusive findings on diagnostic imaging of breast: Secondary | ICD-10-CM

## 2022-06-12 ENCOUNTER — Inpatient Hospital Stay: Payer: Medicaid Other | Admitting: Nurse Practitioner

## 2022-07-17 ENCOUNTER — Inpatient Hospital Stay: Payer: Medicaid Other | Admitting: Nurse Practitioner

## 2022-07-23 ENCOUNTER — Other Ambulatory Visit: Payer: Medicaid Other

## 2022-08-03 ENCOUNTER — Ambulatory Visit
Admission: RE | Admit: 2022-08-03 | Discharge: 2022-08-03 | Disposition: A | Discharge status: Home / Self Care | Payer: Medicaid Other | Source: Ambulatory Visit | Attending: Obstetrics and Gynecology | Admitting: Obstetrics and Gynecology

## 2022-08-03 ENCOUNTER — Other Ambulatory Visit: Payer: Self-pay | Admitting: Obstetrics and Gynecology

## 2022-08-03 DIAGNOSIS — N6311 Unspecified lump in the right breast, upper outer quadrant: Secondary | ICD-10-CM | POA: Diagnosis not present

## 2022-08-03 DIAGNOSIS — R928 Other abnormal and inconclusive findings on diagnostic imaging of breast: Secondary | ICD-10-CM

## 2022-08-03 DIAGNOSIS — N6312 Unspecified lump in the right breast, upper inner quadrant: Secondary | ICD-10-CM | POA: Diagnosis not present

## 2022-08-03 DIAGNOSIS — N6001 Solitary cyst of right breast: Secondary | ICD-10-CM

## 2022-08-03 DIAGNOSIS — R922 Inconclusive mammogram: Secondary | ICD-10-CM | POA: Diagnosis not present

## 2022-08-12 ENCOUNTER — Encounter: Payer: Self-pay | Admitting: Nurse Practitioner

## 2022-08-12 ENCOUNTER — Ambulatory Visit: Payer: Medicaid Other | Attending: Nurse Practitioner | Admitting: Nurse Practitioner

## 2022-08-12 VITALS — BP 146/89 | HR 68 | Temp 98.0°F | Ht 65.0 in | Wt 170.6 lb

## 2022-08-12 DIAGNOSIS — Z7689 Persons encountering health services in other specified circumstances: Secondary | ICD-10-CM

## 2022-08-12 DIAGNOSIS — R7303 Prediabetes: Secondary | ICD-10-CM

## 2022-08-12 DIAGNOSIS — D72829 Elevated white blood cell count, unspecified: Secondary | ICD-10-CM

## 2022-08-12 DIAGNOSIS — Z1159 Encounter for screening for other viral diseases: Secondary | ICD-10-CM

## 2022-08-12 DIAGNOSIS — E785 Hyperlipidemia, unspecified: Secondary | ICD-10-CM | POA: Diagnosis not present

## 2022-08-12 DIAGNOSIS — J45909 Unspecified asthma, uncomplicated: Secondary | ICD-10-CM

## 2022-08-12 DIAGNOSIS — I10 Essential (primary) hypertension: Secondary | ICD-10-CM | POA: Diagnosis not present

## 2022-08-12 MED ORDER — BLOOD PRESSURE MONITOR DEVI: 0 refills | Status: DC

## 2022-08-12 MED ORDER — ALBUTEROL SULFATE HFA 108 (90 BASE) MCG/ACT IN AERS: 2.0000 | INHALATION_SPRAY | RESPIRATORY_TRACT | 11 refills | Status: DC | PRN

## 2022-08-12 MED ORDER — ALBUTEROL SULFATE (2.5 MG/3ML) 0.083% IN NEBU: 2.5000 mg | INHALATION_SOLUTION | Freq: Four times a day (QID) | RESPIRATORY_TRACT | 12 refills | Status: DC | PRN

## 2022-08-12 MED ORDER — HYDROCHLOROTHIAZIDE 25 MG PO TABS: 25.0000 mg | ORAL_TABLET | Freq: Every day | ORAL | 3 refills | Status: DC

## 2022-08-12 NOTE — Progress Notes (Signed)
Assessment & Plan:  Jasmin Stephenson was seen today for establish care and hypertension.  Diagnoses and all orders for this visit:  Encounter to establish care  Primary hypertension -     hydrochlorothiazide (HYDRODIURIL) 25 MG tablet; Take 1 tablet (25 mg total) by mouth daily. -     Blood Pressure Monitor DEVI; Please provide patient with insurance approved blood pressure monitor ICD 10 I10.0 Continue HCTZ as prescribed.  Reminded to bring in blood pressure log for follow  up appointment.  RECOMMENDATIONS: DASH/Mediterranean Diets are healthier choices for HTN.    Leukocytosis, unspecified type -     CBC with Differential  Prediabetes -     CMP14+EGFR -     Hemoglobin A1c  Dyslipidemia, goal LDL below 100 -     Lipid panel  Need for hepatitis C screening test -     HCV Ab w Reflex to Quant PCR  Mild asthma without complication, unspecified whether persistent WELL CONTROLLED -     albuterol (VENTOLIN HFA) 108 (90 Base) MCG/ACT inhaler; Inhale 2 puffs into the lungs every 4 (four) hours as needed for wheezing or shortness of breath (cough, shortness of breath or wheezing.). -     albuterol (PROVENTIL) (2.5 MG/3ML) 0.083% nebulizer solution; Take 3 mLs (2.5 mg total) by nebulization every 6 (six) hours as needed for wheezing or shortness of breath. -     albuterol (PROVENTIL) (2.5 MG/3ML) 0.083% nebulizer solution; Take 3 mLs (2.5 mg total) by nebulization every 6 (six) hours as needed for wheezing or shortness of breath.    Patient has been counseled on age-appropriate routine health concerns for screening and prevention. These are reviewed and up-to-date. Referrals have been placed accordingly. Immunizations are up-to-date or declined.    Subjective:   Chief Complaint  Patient presents with   Establish Care   Hypertension   HPI Jasmin Stephenson 51 y.o. female presents to office today to establish care.  She has a past medical history of Allergy, Anemia, Anxiety, Arthritis,  Asthma, Bipolar disorder, Breast calcification, right, Cervical herniated disc (2017), Depression, Dysmenorrhea (11/2020), GERD , Hyperlipidemia, Menorrhagia (11/2020), Myocardial infarction, Neck fracture, Peripheral vascular disease, and Vitamin D deficiency.   HTN Blood pressure is not well controlled. She is not taking nifedipine as prescribed.  Will start her on HCTZ 25 mg today. A blood pressure cuff was ordered for her today as well.  BP Readings from Last 3 Encounters:  08/12/22 (!) 146/89  06/01/22 (!) 162/104  04/22/22 134/80    Prediabetes  Well controlled with diet only at this time.  Lab Results  Component Value Date   HGBA1C 5.8 (H) 12/04/2020   Refilled rosuvastatin 10 mg today. LDL at goal.  Lab Results  Component Value Date   [facility] 78 12/04/2020   The ASCVD Risk score (Arnett DK, et al., 2019) failed to calculate for the following reasons:   The patient has a prior MI or stroke diagnosis     Review of Systems  Constitutional:  Negative for fever, malaise/fatigue and weight loss.  HENT: Negative.  Negative for nosebleeds.   Eyes: Negative.  Negative for blurred vision, double vision and photophobia.  Respiratory: Negative.  Negative for cough and shortness of breath.   Cardiovascular: Negative.  Negative for chest pain, palpitations and leg swelling.  Gastrointestinal: Negative.  Negative for heartburn, nausea and vomiting.  Musculoskeletal: Negative.  Negative for myalgias.  Neurological: Negative.  Negative for dizziness, focal weakness, seizures and headaches.  Psychiatric/Behavioral: Negative.  Negative for suicidal ideas.     Past Medical History:  Diagnosis Date   Allergy    Anemia    1 unit   Anxiety    RA   Arthritis    and tendonitis   Asthma    Bipolar disorder ([place])    Breast calcification, right    Cervical herniated disc 2017   Depression    Dysmenorrhea 11/2020   Family history of adverse reaction to anesthesia    GERD  (gastroesophageal reflux disease)    History of blood transfusion    Hyperlipidemia    Menorrhagia 11/2020   Myocardial infarction [facility]) yrs ago   " Mild" no cardiologist   Neck fracture ([place])    cervical neck fracture   Peripheral vascular disease ([place])    Vitamin D deficiency     Past Surgical History:  Procedure Laterality Date   ABDOMINAL SURGERY     APPENDECTOMY  yrs ago   [place] N/A 09/21/2016   Procedure: CERVICAL SIX- CERVICAL SEVEN ARTHROPLASTY;  Surgeon: Kevan Ny Ditty, MD;  Location: [place];  Service: Neurosurgery;  Laterality: N/A;  C6-7 Arthroplasty   CHOLECYSTECTOMY N/A 10/10/2019   Procedure: LAPAROSCOPIC CHOLECYSTECTOMY WITH INTRAOPERATIVE CHOLANGIOGRAM;  Surgeon: Donnie Mesa, MD;  Location: [place];  Service: General;  Laterality: N/A;   FOOT SURGERY Left    corrections on foot   IR ANGIOGRAM SELECTIVE EACH ADDITIONAL VESSEL  05/26/2021   IR ANGIOGRAM VISCERAL SELECTIVE  05/26/2021   IR EMBO TUMOR ORGAN ISCHEMIA INFARCT INC GUIDE ROADMAPPING  05/26/2021   IR RADIOLOGIST EVAL & MGMT  03/19/2021   IR RADIOLOGIST EVAL & MGMT  06/10/2021   IR RADIOLOGIST EVAL & MGMT  01/05/2022   IR RENAL BILAT S&I MOD SED  05/26/2021   IR US GUIDE VASC ACCESS LEFT  05/26/2021   IRRIGATION AND DEBRIDEMENT ABSCESS Right 09/10/2014   Procedure: IRRIGATION AND DEBRIDEMENT RIGHT BREAST  ABSCESS;  Surgeon: Gayland Curry, MD;  Location: [facility];  Service: General;  Laterality: Right;   SYNDACTYLIZATION Right 01/08/2021   Procedure: SYNDACTYLIZATION;  Surgeon: Evelina Bucy, DPM;  Location: [place];  Service: Podiatry;  Laterality: Right;   TUBAL LIGATION     per pt    VAGINAL HYSTERECTOMY Bilateral 04/21/2022   Procedure: VAGINAL HYSTERECTOMY, BILATERAL SALPINGECTOMY;  Surgeon: Griffin Basil, MD;  Location: [place];  Service: Gynecology;  Laterality: Bilateral;   WISDOM TOOTH EXTRACTION      Family History  Problem Relation Age of  Onset   Diabetes Father    CAD Father    Breast cancer Sister 69   Alcohol abuse Neg Hx    Arthritis Neg Hx    Asthma Neg Hx    Birth defects Neg Hx    Cancer Neg Hx    COPD Neg Hx    Depression Neg Hx    Drug abuse Neg Hx    Early death Neg Hx    Hearing loss Neg Hx    Heart disease Neg Hx    Hyperlipidemia Neg Hx    Hypertension Neg Hx    Kidney disease Neg Hx    Learning disabilities Neg Hx    Mental illness Neg Hx    Mental retardation Neg Hx    Miscarriages / Stillbirths Neg Hx    Stroke Neg Hx    Vision loss Neg Hx     Social History Reviewed with no changes to be  made today.   Outpatient Medications Prior to Visit  Medication Sig Dispense Refill   aspirin EC 81 MG tablet Take 1 tablet (81 mg total) by mouth daily. 150 tablet 2   ketoconazole (NIZORAL) 2 % shampoo Apply 1 application. topically 2 (two) times a week.     rosuvastatin (CRESTOR) 10 MG tablet Take 1 tablet (10 mg total) by mouth daily. 30 tablet 10   triamcinolone (KENALOG) 0.1 % Apply 1 application topically 2 (two) times daily. (Patient taking differently: Apply 1 application  topically 2 (two) times daily as needed (irritation).) 30 g 1   Vitamin D, Ergocalciferol, (DRISDOL) 1.25 MG (50000 UNIT) CAPS capsule Take 1 capsule (50,000 Units total) by mouth every 7 (seven) days. 5 capsule 6   albuterol (VENTOLIN HFA) 108 (90 Base) MCG/ACT inhaler Inhale 2 puffs into the lungs every 4 (four) hours as needed for wheezing or shortness of breath (cough, shortness of breath or wheezing.). 1 each 11   acetaminophen (TYLENOL) 500 MG tablet Take 500 mg by mouth every 6 (six) hours as needed for moderate pain. (Patient not taking: Reported on 08/12/2022)     albuterol (PROVENTIL) (2.5 MG/3ML) 0.083% nebulizer solution Take 3 mLs (2.5 mg total) by nebulization every 6 (six) hours as needed for wheezing or shortness of breath. (Patient not taking: Reported on 08/12/2022) 75 mL 12   ibuprofen (ADVIL) 600 MG tablet Take 1  tablet (600 mg total) by mouth every 6 (six) hours. (Patient not taking: Reported on 08/12/2022) 30 tablet 2   NIFEdipine (ADALAT CC) 30 MG 24 hr tablet TAKE 1 TABLET BY MOUTH EVERY DAY (Patient not taking: Reported on 08/12/2022) 90 tablet 1   trimethoprim-polymyxin b (POLYTRIM) ophthalmic solution Place 2 drops into the right eye every 4 (four) hours. (Patient not taking: Reported on 08/12/2022) 10 mL 0   No facility-administered medications prior to visit.    Allergies  Allergen Reactions   Penicillins Nausea And Vomiting and Rash    Has patient had a PCN reaction causing immediate rash, facial/tongue/throat swelling, SOB or lightheadedness with hypotension: Yes Has patient had a PCN reaction causing severe rash involving mucus membranes or skin necrosis: Yes Has patient had a PCN reaction that required hospitalization No Has patient had a PCN reaction occurring within the last 10 years: No If all of the above answers are "NO", then may proceed with Cephalosporin use.       Objective:    BP (!) 146/89   Pulse 68   Temp 98 F (36.7 C) (Oral)   Ht 5' 5"  (1.651 m)   Wt 170 lb 9.6 oz (77.4 kg)   LMP 03/27/2022 Comment: DOS UPREG NEGATIVE  SpO2 100%   BMI 28.39 kg/m  Wt Readings from Last 3 Encounters:  08/12/22 170 lb 9.6 oz (77.4 kg)  06/01/22 179 lb 12.8 oz (81.6 kg)  04/21/22 180 lb (81.6 kg)    Physical Exam Vitals and nursing note reviewed.  Constitutional:      Appearance: She is well-developed.  HENT:     Head: Normocephalic and atraumatic.  Cardiovascular:     Rate and Rhythm: Normal rate and regular rhythm.     Heart sounds: Normal heart sounds. No murmur heard.    No friction rub. No gallop.  Pulmonary:     Effort: Pulmonary effort is normal. No tachypnea or respiratory distress.     Breath sounds: Normal breath sounds. No decreased breath sounds, wheezing, rhonchi or rales.  Chest:  Chest wall: No tenderness.  Abdominal:     General: Bowel sounds are  normal.     Palpations: Abdomen is soft.  Musculoskeletal:        General: Normal range of motion.     Cervical back: Normal range of motion.  Skin:    General: Skin is warm and dry.  Neurological:     Mental Status: She is alert and oriented to person, place, and time.     Coordination: Coordination normal.  Psychiatric:        Behavior: Behavior normal. Behavior is cooperative.        Thought Content: Thought content normal.        Judgment: Judgment normal.          Patient has been counseled extensively about nutrition and exercise as well as the importance of adherence with medications and regular follow-up. The patient was given clear instructions to go to ER or return to medical center if symptoms don't improve, worsen or new problems develop. The patient verbalized understanding.   Follow-up: Return for HTN BP NURSE VISIT in 4 weeks> see me in 3 months.   Gildardo Pounds, FNP-BC [facility] and [facility], [place]   08/12/2022, 8:54 PM

## 2022-08-12 NOTE — Patient Instructions (Signed)
[facility]  [phone]

## 2022-08-13 DIAGNOSIS — I10 Essential (primary) hypertension: Secondary | ICD-10-CM | POA: Diagnosis not present

## 2022-08-13 LAB — CBC WITH DIFFERENTIAL/PLATELET
Basophils Absolute: 0 10*3/uL (ref 0.0–0.2)
Basos: 1 %
EOS (ABSOLUTE): 0.2 10*3/uL (ref 0.0–0.4)
Eos: 3 %
Hematocrit: 42.3 % (ref 34.0–46.6)
Hemoglobin: 13.6 g/dL (ref 11.1–15.9)
Immature Grans (Abs): 0 10*3/uL (ref 0.0–0.1)
Immature Granulocytes: 0 %
Lymphocytes Absolute: 2.2 10*3/uL (ref 0.7–3.1)
Lymphs: 29 %
MCH: 27.6 pg (ref 26.6–33.0)
MCHC: 32.2 g/dL (ref 31.5–35.7)
MCV: 86 fL (ref 79–97)
Monocytes Absolute: 0.7 10*3/uL (ref 0.1–0.9)
Monocytes: 9 %
Neutrophils Absolute: 4.6 10*3/uL (ref 1.4–7.0)
Neutrophils: 58 %
Platelets: 416 10*3/uL (ref 150–450)
RBC: 4.92 x10E6/uL (ref 3.77–5.28)
RDW: 17.1 % — ABNORMAL HIGH (ref 11.7–15.4)
WBC: 7.7 10*3/uL (ref 3.4–10.8)

## 2022-08-13 LAB — CMP14+EGFR
ALT: 8 IU/L (ref 0–32)
AST: 11 IU/L (ref 0–40)
Albumin/Globulin Ratio: 1.5 (ref 1.2–2.2)
Albumin: 4.1 g/dL (ref 3.9–4.9)
Alkaline Phosphatase: 103 IU/L (ref 44–121)
BUN/Creatinine Ratio: 5 — ABNORMAL LOW (ref 9–23)
BUN: 5 mg/dL — ABNORMAL LOW (ref 6–24)
Bilirubin Total: 0.3 mg/dL (ref 0.0–1.2)
CO2: 24 mmol/L (ref 20–29)
Calcium: 9.2 mg/dL (ref 8.7–10.2)
Chloride: 106 mmol/L (ref 96–106)
Creatinine, Ser: 0.97 mg/dL (ref 0.57–1.00)
Globulin, Total: 2.7 g/dL (ref 1.5–4.5)
Glucose: 94 mg/dL (ref 70–99)
Potassium: 3.9 mmol/L (ref 3.5–5.2)
Sodium: 142 mmol/L (ref 134–144)
Total Protein: 6.8 g/dL (ref 6.0–8.5)
eGFR: 72 mL/min/{1.73_m2} (ref >59)

## 2022-08-13 LAB — HCV AB W REFLEX TO QUANT PCR: HCV Ab: NONREACTIVE

## 2022-08-13 LAB — LIPID PANEL
Chol/HDL Ratio: 4.5 ratio — ABNORMAL HIGH (ref 0.0–4.4)
Cholesterol, Total: 195 mg/dL (ref 100–199)
HDL: 43 mg/dL (ref >39)
LDL Chol Calc (NIH): 128 mg/dL — ABNORMAL HIGH (ref 0–99)
Triglycerides: 135 mg/dL (ref 0–149)
VLDL Cholesterol Cal: 24 mg/dL (ref 5–40)

## 2022-08-13 LAB — HEMOGLOBIN A1C
Est. average glucose Bld gHb Est-mCnc: 117 mg/dL
Hgb A1c MFr Bld: 5.7 % — ABNORMAL HIGH (ref 4.8–5.6)

## 2022-08-13 LAB — HCV INTERPRETATION

## 2022-09-01 ENCOUNTER — Telehealth: Payer: Self-pay

## 2022-09-01 NOTE — Telephone Encounter (Signed)
Pt called and said she needs a letter saying when she had a hysterectomy and that she is medically cleared from that. Ms. Rivenbark said she needs it for a custody case. Her number on file is correct.   Letter provided via mychart.   Attempted to contact patient to inform her that she can access the letter in [place]. No answer. Unable to leave message.

## 2022-09-03 ENCOUNTER — Encounter: Payer: Self-pay | Admitting: Obstetrics and Gynecology

## 2022-09-08 ENCOUNTER — Ambulatory Visit (INDEPENDENT_AMBULATORY_CARE_PROVIDER_SITE_OTHER): Payer: Medicaid Other | Admitting: Podiatry

## 2022-09-08 ENCOUNTER — Encounter: Payer: Self-pay | Admitting: Podiatry

## 2022-09-08 DIAGNOSIS — Q828 Other specified congenital malformations of skin: Secondary | ICD-10-CM | POA: Diagnosis not present

## 2022-09-08 DIAGNOSIS — L84 Corns and callosities: Secondary | ICD-10-CM

## 2022-09-08 NOTE — Progress Notes (Signed)
  Subjective:  Patient ID: Jasmin Stephenson, female    DOB: 1971/11/21,   MRN: [id number]  Chief Complaint  Patient presents with   Foot Problem    [place] on bilateral feet as well as bone spurs. Patient needs a letter stating that she is able to left her grandson with no restrictions. Patient also has an issue with splitting nails on bilateral feet     51 y.o. female presents for concern of bilateral feet to have them reassessed. She relates she is trying to get custody of her grandson and needs her feet evaluated to make sure she has no restrictions when taking care of him. Relates she is doing well and not having much trouble. Does relates the calluses on her feet bother her but she keeps them trimmed and she does well.  . Denies any other pedal complaints. Denies n/v/f/c.   Past Medical History:  Diagnosis Date   Allergy    Anemia    1 unit   Anxiety    RA   Arthritis    and tendonitis   Asthma    Bipolar disorder ([place])    Breast calcification, right    Cervical herniated disc 2017   Depression    Dysmenorrhea 11/2020   Family history of adverse reaction to anesthesia    GERD (gastroesophageal reflux disease)    History of blood transfusion    Hyperlipidemia    Menorrhagia 11/2020   Myocardial infarction [facility]) yrs ago   " Mild" no cardiologist   Neck fracture ([place])    cervical neck fracture   Peripheral vascular disease (HCC)    Vitamin D deficiency     Objective:  Physical Exam: Vascular: DP/PT pulses 2/4 bilateral. CFT <3 seconds. Normal hair growth on digits. No edema.  Skin. No lacerations or abrasions bilateral feet. Hyperkeratotic lesions noted sub first and fifth metatarsals and hallux bilateral as well as bilateral plantar heels and midfoot.  Musculoskeletal: MMT 5/5 bilateral lower extremities in DF, PF, Inversion and Eversion. Deceased ROM in DF of ankle joint.  Neurological: Sensation intact to light touch.   Assessment:   1. Porokeratosis   2. Callus  of foot      Plan:  Patient was evaluated and treated and all questions answered. -Discussed corns and calluses with patient and treatment options.  -Hyperkeratotic tissue was debrided with chisel without incident as courtesy.  -Encouraged daily moisturizing -Discussed use of pumice stone -Advised good supportive shoes and inserts -Note provided for no restrictions for lifting or activity in regards to her feet.  -Patient to return to office as needed or sooner if condition worsens.   Lorenda Peck, DPM

## 2022-09-14 ENCOUNTER — Ambulatory Visit: Payer: Medicaid Other

## 2022-09-16 ENCOUNTER — Ambulatory Visit: Payer: Medicaid Other

## 2022-11-16 ENCOUNTER — Encounter: Payer: Self-pay | Admitting: Nurse Practitioner

## 2022-11-16 ENCOUNTER — Ambulatory Visit: Payer: Medicaid Other | Attending: Nurse Practitioner | Admitting: Nurse Practitioner

## 2022-11-16 VITALS — BP 143/86 | HR 78 | Ht 65.0 in | Wt 187.8 lb

## 2022-11-16 DIAGNOSIS — L730 Acne keloid: Secondary | ICD-10-CM

## 2022-11-16 DIAGNOSIS — L709 Acne, unspecified: Secondary | ICD-10-CM

## 2022-11-16 DIAGNOSIS — L2082 Flexural eczema: Secondary | ICD-10-CM

## 2022-11-16 DIAGNOSIS — I10 Essential (primary) hypertension: Secondary | ICD-10-CM | POA: Diagnosis not present

## 2022-11-16 MED ORDER — BENZOYL PEROXIDE WASH 5 % EX LIQD: Freq: Two times a day (BID) | CUTANEOUS | 1 refills | Status: DC

## 2022-11-16 MED ORDER — TRIAMCINOLONE ACETONIDE 0.1 % EX CREA: 1.0000 | TOPICAL_CREAM | Freq: Two times a day (BID) | CUTANEOUS | 1 refills | Status: DC

## 2022-11-16 MED ORDER — VALSARTAN 80 MG PO TABS: 80.0000 mg | ORAL_TABLET | Freq: Every day | ORAL | 3 refills | Status: DC

## 2022-11-16 NOTE — Progress Notes (Signed)
Assessment & Plan:  Jasmin Stephenson was seen today for medication refill and hypertension.  Diagnoses and all orders for this visit:  Primary hypertension DCd HCTZ today -     valsartan (DIOVAN) 80 MG tablet; Take 1 tablet (80 mg total) by mouth daily. -     CMP14+EGFR Continue all antihypertensives as prescribed.  Reminded to bring in blood pressure log for follow  up appointment.  RECOMMENDATIONS: DASH/Mediterranean Diets are healthier choices for HTN.    Flexural eczema -     triamcinolone cream (KENALOG) 0.1 %; Apply 1 Application topically 2 (two) times daily. For eczema on elbows  Adult acne -     Ambulatory referral to Dermatology -     benzoyl peroxide 5 % external liquid; Apply topically 2 (two) times daily.  Acne scarring -     Ambulatory referral to Dermatology -     benzoyl peroxide 5 % external liquid; Apply topically 2 (two) times daily.    Patient has been counseled on age-appropriate routine health concerns for screening and prevention. These are reviewed and up-to-date. Referrals have been placed accordingly. Immunizations are up-to-date or declined.    Subjective:   Chief Complaint  Patient presents with   Medication Refill   Hypertension   Medication Refill Pertinent negatives include no chest pain, coughing, fever, headaches, myalgias, nausea or vomiting.  Hypertension Pertinent negatives include no blurred vision, chest pain, headaches, malaise/fatigue, palpitations or shortness of breath.   Jasmin Stephenson 51 y.o. female presents to office today for follow for follow up to HTN  Requesting refill of triamcinolone for eczema on elbows.   HTN Blood pressure is not well controlled with HCTZ 25 mg daily. She reports similar readings at home compared to today's. Will start valsartan at this time.  BP Readings from Last 3 Encounters:  11/16/22 (!) 143/86  08/12/22 (!) 146/89  06/01/22 (!) 162/104    Acne: Patient presents for evaluation of acne.  Onset was  several years ago. Symptoms have gradually worsened. Lesions are described as closed comedones and scars. Acne is primarily located on the face. Treatment to date has included benzoyl peroxide preparations: too early to assess effectiveness    Review of Systems  Constitutional:  Negative for fever, malaise/fatigue and weight loss.  HENT: Negative.  Negative for nosebleeds.   Eyes: Negative.  Negative for blurred vision, double vision and photophobia.  Respiratory: Negative.  Negative for cough and shortness of breath.   Cardiovascular: Negative.  Negative for chest pain, palpitations and leg swelling.  Gastrointestinal: Negative.  Negative for heartburn, nausea and vomiting.  Musculoskeletal: Negative.  Negative for myalgias.  Skin:        SEE HPI  Neurological: Negative.  Negative for dizziness, focal weakness, seizures and headaches.  Psychiatric/Behavioral: Negative.  Negative for suicidal ideas.     Past Medical History:  Diagnosis Date   Allergy    Anemia    1 unit   Anxiety    RA   Arthritis    and tendonitis   Asthma    Bipolar disorder ([place])    Breast calcification, right    Cervical herniated disc 2017   Depression    Dysmenorrhea 11/2020   Family history of adverse reaction to anesthesia    GERD (gastroesophageal reflux disease)    History of blood transfusion    Hyperlipidemia    Menorrhagia 11/2020   Myocardial infarction [facility]) yrs ago   " Mild" no cardiologist   Neck fracture ([place])  cervical neck fracture   Peripheral vascular disease ([place])    Vitamin D deficiency     Past Surgical History:  Procedure Laterality Date   ABDOMINAL SURGERY     APPENDECTOMY  yrs ago   [place] N/A 09/21/2016   Procedure: CERVICAL SIX- CERVICAL SEVEN ARTHROPLASTY;  Surgeon: Kevan Ny Ditty, MD;  Location: [place];  Service: Neurosurgery;  Laterality: N/A;  C6-7 Arthroplasty   CHOLECYSTECTOMY N/A 10/10/2019   Procedure: LAPAROSCOPIC  CHOLECYSTECTOMY WITH INTRAOPERATIVE CHOLANGIOGRAM;  Surgeon: Donnie Mesa, MD;  Location: [place];  Service: General;  Laterality: N/A;   FOOT SURGERY Left    corrections on foot   IR ANGIOGRAM SELECTIVE EACH ADDITIONAL VESSEL  05/26/2021   IR ANGIOGRAM VISCERAL SELECTIVE  05/26/2021   IR EMBO TUMOR ORGAN ISCHEMIA INFARCT INC GUIDE ROADMAPPING  05/26/2021   IR RADIOLOGIST EVAL & MGMT  03/19/2021   IR RADIOLOGIST EVAL & MGMT  06/10/2021   IR RADIOLOGIST EVAL & MGMT  01/05/2022   IR RENAL BILAT S&I MOD SED  05/26/2021   IR US GUIDE VASC ACCESS LEFT  05/26/2021   IRRIGATION AND DEBRIDEMENT ABSCESS Right 09/10/2014   Procedure: IRRIGATION AND DEBRIDEMENT RIGHT BREAST  ABSCESS;  Surgeon: Gayland Curry, MD;  Location: [facility];  Service: General;  Laterality: Right;   SYNDACTYLIZATION Right 01/08/2021   Procedure: SYNDACTYLIZATION;  Surgeon: Evelina Bucy, DPM;  Location: [place];  Service: Podiatry;  Laterality: Right;   TUBAL LIGATION     per pt    VAGINAL HYSTERECTOMY Bilateral 04/21/2022   Procedure: VAGINAL HYSTERECTOMY, BILATERAL SALPINGECTOMY;  Surgeon: Griffin Basil, MD;  Location: [place];  Service: Gynecology;  Laterality: Bilateral;   WISDOM TOOTH EXTRACTION      Family History  Problem Relation Age of Onset   Diabetes Father    CAD Father    Breast cancer Sister 41   Alcohol abuse Neg Hx    Arthritis Neg Hx    Asthma Neg Hx    Birth defects Neg Hx    Cancer Neg Hx    COPD Neg Hx    Depression Neg Hx    Drug abuse Neg Hx    Early death Neg Hx    Hearing loss Neg Hx    Heart disease Neg Hx    Hyperlipidemia Neg Hx    Hypertension Neg Hx    Kidney disease Neg Hx    Learning disabilities Neg Hx    Mental illness Neg Hx    Mental retardation Neg Hx    Miscarriages / Stillbirths Neg Hx    Stroke Neg Hx    Vision loss Neg Hx     Social History Reviewed with no changes to be made today.   Outpatient Medications Prior to Visit  Medication Sig Dispense Refill    albuterol (PROVENTIL) (2.5 MG/3ML) 0.083% nebulizer solution Take 3 mLs (2.5 mg total) by nebulization every 6 (six) hours as needed for wheezing or shortness of breath. 75 mL 12   albuterol (VENTOLIN HFA) 108 (90 Base) MCG/ACT inhaler Inhale 2 puffs into the lungs every 4 (four) hours as needed for wheezing or shortness of breath (cough, shortness of breath or wheezing.). 1 each 11   aspirin EC 81 MG tablet Take 1 tablet (81 mg total) by mouth daily. 150 tablet 2   Blood Pressure Monitor DEVI Please provide patient with insurance approved blood pressure monitor ICD 10 I10.0 1 each 0  ketoconazole (NIZORAL) 2 % shampoo Apply 1 application. topically 2 (two) times a week.     rosuvastatin (CRESTOR) 10 MG tablet Take 1 tablet (10 mg total) by mouth daily. 30 tablet 10   Vitamin D, Ergocalciferol, (DRISDOL) 1.25 MG (50000 UNIT) CAPS capsule Take 1 capsule (50,000 Units total) by mouth every 7 (seven) days. 5 capsule 6   hydrochlorothiazide (HYDRODIURIL) 25 MG tablet Take 1 tablet (25 mg total) by mouth daily. 90 tablet 3   triamcinolone (KENALOG) 0.1 % Apply 1 application topically 2 (two) times daily. (Patient taking differently: Apply 1 application  topically 2 (two) times daily as needed (irritation).) 30 g 1   No facility-administered medications prior to visit.    Allergies  Allergen Reactions   Penicillins Nausea And Vomiting and Rash    Has patient had a PCN reaction causing immediate rash, facial/tongue/throat swelling, SOB or lightheadedness with hypotension: Yes Has patient had a PCN reaction causing severe rash involving mucus membranes or skin necrosis: Yes Has patient had a PCN reaction that required hospitalization No Has patient had a PCN reaction occurring within the last 10 years: No If all of the above answers are "NO", then may proceed with Cephalosporin use.       Objective:    BP (!) 143/86   Pulse 78   Ht _0  (1.651 m)   Wt 187 lb 12.8 oz (85.2 kg)   LMP  03/27/2022 Comment: DOS UPREG NEGATIVE  SpO2 98%   BMI 31.25 kg/m  Wt Readings from Last 3 Encounters:  11/16/22 187 lb 12.8 oz (85.2 kg)  08/12/22 170 lb 9.6 oz (77.4 kg)  06/01/22 179 lb 12.8 oz (81.6 kg)    Physical Exam Vitals and nursing note reviewed.  Constitutional:      Appearance: She is well-developed.  HENT:     Head: Normocephalic and atraumatic.  Cardiovascular:     Rate and Rhythm: Normal rate and regular rhythm.     Heart sounds: Normal heart sounds. No murmur heard.    No friction rub. No gallop.  Pulmonary:     Effort: Pulmonary effort is normal. No tachypnea or respiratory distress.     Breath sounds: Normal breath sounds. No decreased breath sounds, wheezing, rhonchi or rales.  Chest:     Chest wall: No tenderness.  Abdominal:     General: Bowel sounds are normal.     Palpations: Abdomen is soft.  Musculoskeletal:        General: Normal range of motion.     Cervical back: Normal range of motion.  Skin:    General: Skin is warm and dry.       Neurological:     Mental Status: She is alert and oriented to person, place, and time.     Coordination: Coordination normal.  Psychiatric:        Behavior: Behavior normal. Behavior is cooperative.        Judgment: Judgment normal.          Patient has been counseled extensively about nutrition and exercise as well as the importance of adherence with medications and regular follow-up. The patient was given clear instructions to go to ER or return to medical center if symptoms don't improve, worsen or new problems develop. The patient verbalized understanding.   Follow-up: Return for Double book any time for phone visit on the 26th of December. BP . then see me in March for HTN.   Gildardo Pounds, FNP-BC [facility] [place], [place]   11/16/2022, 10:58 AM

## 2022-11-17 LAB — CMP14+EGFR
ALT: 9 IU/L (ref 0–32)
AST: 13 IU/L (ref 0–40)
Albumin/Globulin Ratio: 1.5 (ref 1.2–2.2)
Albumin: 4.1 g/dL (ref 3.9–4.9)
Alkaline Phosphatase: 96 IU/L (ref 44–121)
BUN/Creatinine Ratio: 8 — ABNORMAL LOW (ref 9–23)
BUN: 9 mg/dL (ref 6–24)
Bilirubin Total: 0.2 mg/dL (ref 0.0–1.2)
CO2: 25 mmol/L (ref 20–29)
Calcium: 9.2 mg/dL (ref 8.7–10.2)
Chloride: 103 mmol/L (ref 96–106)
Creatinine, Ser: 1.08 mg/dL — ABNORMAL HIGH (ref 0.57–1.00)
Globulin, Total: 2.7 g/dL (ref 1.5–4.5)
Glucose: 85 mg/dL (ref 70–99)
Potassium: 4 mmol/L (ref 3.5–5.2)
Sodium: 143 mmol/L (ref 134–144)
Total Protein: 6.8 g/dL (ref 6.0–8.5)
eGFR: 63 mL/min/1.73

## 2022-12-08 ENCOUNTER — Telehealth: Payer: Medicaid Other | Admitting: Nurse Practitioner

## 2022-12-08 NOTE — Progress Notes (Deleted)
MAILBOX FULL> UNABLE TO LEAVE A VM

## 2023-02-04 ENCOUNTER — Ambulatory Visit
Admission: RE | Admit: 2023-02-04 | Discharge: 2023-02-04 | Disposition: A | Discharge status: Home / Self Care | Payer: Medicaid Other | Source: Ambulatory Visit | Attending: Obstetrics and Gynecology | Admitting: Obstetrics and Gynecology

## 2023-02-04 ENCOUNTER — Other Ambulatory Visit: Payer: Self-pay | Admitting: Obstetrics and Gynecology

## 2023-02-04 DIAGNOSIS — N6001 Solitary cyst of right breast: Secondary | ICD-10-CM

## 2023-02-04 DIAGNOSIS — N6310 Unspecified lump in the right breast, unspecified quadrant: Secondary | ICD-10-CM

## 2023-02-04 DIAGNOSIS — N6011 Diffuse cystic mastopathy of right breast: Secondary | ICD-10-CM | POA: Diagnosis not present

## 2023-03-18 DIAGNOSIS — L219 Seborrheic dermatitis, unspecified: Secondary | ICD-10-CM | POA: Diagnosis not present

## 2023-03-18 DIAGNOSIS — L732 Hidradenitis suppurativa: Secondary | ICD-10-CM | POA: Diagnosis not present

## 2023-03-18 DIAGNOSIS — L658 Other specified nonscarring hair loss: Secondary | ICD-10-CM | POA: Diagnosis not present

## 2023-03-18 DIAGNOSIS — L2084 Intrinsic (allergic) eczema: Secondary | ICD-10-CM | POA: Diagnosis not present

## 2023-04-12 ENCOUNTER — Ambulatory Visit: Payer: Self-pay | Admitting: Surgery

## 2023-04-12 DIAGNOSIS — K429 Umbilical hernia without obstruction or gangrene: Secondary | ICD-10-CM | POA: Diagnosis not present

## 2023-04-12 NOTE — Progress Notes (Signed)
 PROVIDER:  DONNICE DEWAYNE LIMA, MD  MRN: [id number] DOB: 09/27/1971 DATE OF ENCOUNTER: 04/12/2023 Subjective   Chief Complaint: Follow-up (recheck possible hernia)     History of Present Illness: Jasmin Stephenson is a 52 y.o. female who is seen today for periumbilical pain/ abnormal bowel movements.  This is a 52 year old female who underwent laparoscopic repair of a hiatal hernia in 2011 with primary repair of an umbilical hernia.  In 2020, she presented with periumbilical pain, reflux, nausea, and bloating.  We evaluated her fundoplication and there were no issues.  EGD was unremarkable.  On 02/07/19, she underwent open mesh repair of a recurrent umbilical hernia.  She had a 1.5 cm defect repaired with a small Ventralex mesh.  She did well after that surgery.  However, later in 2020, she developed gallbladder symptoms and underwent laparoscopic cholecystectomy with IOC.  Pathology confirmed chronic cholecystitis.  Her digestive symptoms improved.   Last year, she was evaluated for worsening pain just to the left of her umbilicus.  I obtained a CT scan in March 2023 that had no mention of a recurrent umbilical hernia and the radiology report.  She did have significant uterine fibroids. Last year, she underwent embolization of her uterine artery for fibroids.  In May 2023, she underwent total vaginal hysterectomy and bilateral salpingectomy by Dr. Zina.  The patient presents today continued to complain of pain to the left of her umbilicus.  She has begun to notice a bulge in this area.  Unfortunately, the patient continues to smoke a pack a day.  Last week, she had an episode where she had several days of fairly frequent large bowel movements.  She denies any diarrhea.  She states that normally she is fairly regular with the bowel movements and does not need to take any stool softeners or laxatives.  The frequent bowel movements have resolved.  She had previously been seen by [facility].  She denies any  hematochezia or melena.   Review of Systems: A complete review of systems was obtained from the patient.  I have reviewed this information and discussed as appropriate with the patient.  See HPI as well for other ROS.  Review of Systems  Constitutional: Negative.   HENT: Negative.    Eyes: Negative.   Respiratory: Negative.    Cardiovascular: Negative.   Gastrointestinal:  Positive for abdominal pain and constipation.  Genitourinary: Negative.   Musculoskeletal: Negative.   Skin: Negative.   Neurological: Negative.   Endo/Heme/Allergies: Negative.   Psychiatric/Behavioral: Negative.        Medical History: Past Medical History:  Diagnosis Date  . Anxiety   . Arthritis     Patient Active Problem List  Diagnosis  . 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 History of MI (myocardial infarction)  . Leiomyoma of uterus, unspecified    Past Surgical History:  Procedure Laterality Date  . APPENDECTOMY    . CHOLECYSTECTOMY       Allergies  Allergen Reactions  . Penicillins Nausea And Vomiting and Rash    Has patient had a PCN reaction causing immediate rash, facial/tongue/throat swelling, SOB or lightheadedness with hypotension: Yes Has patient had a PCN reaction causing severe rash involving mucus membranes or skin necrosis: Yes Has patient had a PCN reaction that required hospitalization No Has patient had a PCN reaction occurring within the last 10 years: No If all of the above answers are NO, then may proceed with Cephalosporin use. Has patient  had a PCN reaction causing immediate rash, facial/tongue/throat swelling, SOB or lightheadedness with hypotension: Yes Has patient had a PCN reaction causing severe rash involving mucus membranes or skin necrosis: Yes Has patient had a PCN reaction that required hospitalization No Has patient had a PCN reaction occurring within the last 10 years: No If all of the above answers are NO, then may proceed  with Cephalosporin use.     Current Outpatient Medications on File Prior to Visit  Medication Sig Dispense Refill  . acetaminophen  (TYLENOL ) 500 MG tablet Take 500 mg by mouth    . albuterol  (PROVENTIL ) 2.5 mg /3 mL (0.083 %) nebulizer solution Inhale into the lungs    . aspirin  81 MG EC tablet Take by mouth    . betamethasone, augmented, (DIPROLENE) 0.05 % lotion APPLY TO SCALP DAILY X 2 WK, THEN EVERY OTHER DAY. NOT TO FACE.    . clobetasoL (TEMOVATE) 0.05 % ointment APPLY TO THE AFFECTED AREAS OF THE SCALP 3-4 TIMES WEEKLY    . ergocalciferol , vitamin D2, 1,250 mcg (50,000 unit) capsule Take by mouth    . hydrOXYzine  (ATARAX ) 25 MG tablet TAKE 1-2 TABLETS BY MOUTH EVERY 8 HOURS AS NEEDED FOR ITCHING.    SABRA ketoconazole (NIZORAL) 2 % shampoo Apply to scalp and shampoo as usual. Repeat 1-2 times weekly     No current facility-administered medications on file prior to visit.    Family History  Problem Relation Age of Onset  . Skin cancer Father   . High blood pressure (Hypertension) Father   . Colon cancer Father      Social History   Tobacco Use  Smoking Status Every Day  . Types: Cigarettes  Smokeless Tobacco Never     Social History   Socioeconomic History  . Marital status: Unknown  Tobacco Use  . Smoking status: Every Day    Types: Cigarettes  . Smokeless tobacco: Never    Objective:    Vitals:   04/12/23 0952  PainSc:   5      Physical Exam   Well-developed well-nourished no apparent distress Abdomen shows well-healed laparoscopic incisions No visible bulge Just to the left of the umbilicus, when the patient is standing with Valsalva maneuver, a small bulge is palpated.  This is spontaneously reducible.  No other ventral hernias noted. Labs, Imaging and Diagnostic Testing:  The CT report from 2023 does not mention any sign of recurrent hernia.  However on my examination of the images, there may be a very small defect just to the left of umbilicus.   This measures 6 mm.  It is likely larger now.   Assessment and Plan:  Diagnoses and all orders for this visit:  Recurrent umbilical hernia    The patient may have a small fat-containing recurrent umbilical hernia just to the left of her previous repair.  Unfortunately, she continues to smoke 1 pack/day.  After counseling, the patient will discuss smoking cessation with her PCP.  I also encouraged her to use a daily fiber supplement to help regulate her bowel movements.  She may now be completely emptying with her daily bowel movements.  Reevaluate in 6 weeks to see if her bowel movements have improved.  We will also see if she has been able to cut back on her smoking.  Would likely recommend a laparoscopic repair of her recurrent umbilical hernia.  Return in about 6 weeks (around 05/24/2023).  MATTHEW DEWAYNE LIMA, MD   04/12/2023 10:49 AM

## 2023-05-19 NOTE — Therapy (Signed)
OUTPATIENT PHYSICAL THERAPY EVALUATION   Patient Name: Jasmin Stephenson MRN: [id number] DOB:11-27-1971, 52 y.o., female Today's Date: 05/20/2023   END OF SESSION:  PT End of Session - 05/20/23 1029     Visit Number 1    Number of Visits 9    Date for PT Re-Evaluation 07/15/23    Authorization Type [employer]    PT Start Time 1015    PT Stop Time 1100    PT Time Calculation (min) 45 min    Activity Tolerance Patient tolerated treatment well    Behavior During Therapy WFL for tasks assessed/performed             Past Medical History:  Diagnosis Date   Allergy    Anemia    1 unit   Anxiety    RA   Arthritis    and tendonitis   Asthma    Bipolar disorder (HCC)    Breast calcification, right    Cervical herniated disc 2017   Depression    Dysmenorrhea 11/2020   Family history of adverse reaction to anesthesia    GERD (gastroesophageal reflux disease)    History of blood transfusion    Hyperlipidemia    Menorrhagia 11/2020   Myocardial infarction [facility]) yrs ago   " Mild" no cardiologist   Neck fracture (HCC)    cervical neck fracture   Peripheral vascular disease (HCC)    Vitamin D deficiency    Past Surgical History:  Procedure Laterality Date   ABDOMINAL SURGERY     APPENDECTOMY  yrs ago   BLADDER SURGERY     CERVICAL DISC ARTHROPLASTY N/A 09/21/2016   Procedure: CERVICAL SIX- CERVICAL SEVEN ARTHROPLASTY;  Surgeon: Loura Halt Ditty, MD;  Location: [facility] OR;  Service: Neurosurgery;  Laterality: N/A;  C6-7 Arthroplasty   CHOLECYSTECTOMY N/A 10/10/2019   Procedure: LAPAROSCOPIC CHOLECYSTECTOMY WITH INTRAOPERATIVE CHOLANGIOGRAM;  Surgeon: Manus Rudd, MD;  Location: [facility] OR;  Service: General;  Laterality: N/A;   FOOT SURGERY Left    corrections on foot   IR ANGIOGRAM SELECTIVE EACH ADDITIONAL VESSEL  05/26/2021   IR ANGIOGRAM VISCERAL SELECTIVE  05/26/2021   IR EMBO TUMOR ORGAN ISCHEMIA INFARCT INC GUIDE ROADMAPPING  05/26/2021   IR RADIOLOGIST EVAL &  MGMT  03/19/2021   IR RADIOLOGIST EVAL & MGMT  06/10/2021   IR RADIOLOGIST EVAL & MGMT  01/05/2022   IR RENAL BILAT S&I MOD SED  05/26/2021   IR US GUIDE VASC ACCESS LEFT  05/26/2021   IRRIGATION AND DEBRIDEMENT ABSCESS Right 09/10/2014   Procedure: IRRIGATION AND DEBRIDEMENT RIGHT BREAST  ABSCESS;  Surgeon: Atilano Ina, MD;  Location: [facility];  Service: General;  Laterality: Right;   SYNDACTYLIZATION Right 01/08/2021   Procedure: SYNDACTYLIZATION;  Surgeon: Park Liter, DPM;  Location: [facility] [place];  Service: Podiatry;  Laterality: Right;   TUBAL LIGATION     per pt    VAGINAL HYSTERECTOMY Bilateral 04/21/2022   Procedure: VAGINAL HYSTERECTOMY, BILATERAL SALPINGECTOMY;  Surgeon: Warden Fillers, MD;  Location: [facility] OR;  Service: Gynecology;  Laterality: Bilateral;   WISDOM TOOTH EXTRACTION     Patient Active Problem List   Diagnosis Date Noted   S/P vaginal hysterectomy 04/21/2022   Uterine leiomyoma 05/26/2021   Fibroid uterus 02/25/2021   No-show for appointment 02/19/2021   Scalp pruritus 02/18/2021   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 s/p previous cx surgery  THERAPY DIAG:  Cervicalgia  Chronic right shoulder pain  Muscle weakness (generalized)  Rationale for Evaluation and Treatment: Rehabilitation  ONSET DATE:  09/21/2026   SUBJECTIVE:                                                                                                                                                                                                        SUBJECTIVE STATEMENT: Patient reports neck pain and right arm weakness, it goes to sleep and acts like it doesn't want to move. She did have neck surgery in 2017 and has had issues with her right neck and arm since then. She has been having a lot of pain in her right neck and shoulder, and she has been dropping things with the right hand like her grip isn't strong. She reports she will get numbness like the arm goes to sleep every once in a while and it feels like the whole arm. She has trouble sleeping and feels like she has to constantly turn in bed because of the pain. She denies any recent MRI or other treatments such as injections for her neck. She did have a unit with pads that she used many years ago but it does not work anymore and she does not have any of the pads, but this did help.  Hand dominance: Left  PERTINENT HISTORY:  C6-7 fusion 2017  PAIN:  Are you having pain? Yes:  NPRS scale: 8/10 Pain location: Neck, right arm Pain description: "Disturbing," numbness Aggravating factors: Activity, all neck movement, laying in bed Relieving factors: Tylenol, heat  PRECAUTIONS: None  WEIGHT BEARING RESTRICTIONS: No  FALLS:  Has patient fallen in last 6 months? No  PLOF: Independent  PATIENT GOALS: Pain relief and be able to use right arm normally   OBJECTIVE:  PATIENT SURVEYS:  FOTO 43% functional status  COGNITION: Overall cognitive status: Within functional limits for tasks assessed  SENSATION: Patient reports global sensation deficits of right lower arm and hand, no specific dermatomal pattern noted  POSTURE:   Rounded  shoulder and forward head posture  PALPATION: Tender to palpation right cervical paraspinals, upper trap, and suboccipital  region with increase in muscular tension noted  CERVICAL ROM:   Active ROM A/PROM (deg) eval  Flexion 45  Extension 20  Right lateral flexion 20  Left lateral flexion 25  Right rotation 35  Left rotation 50   (Blank rows = not tested)  Patient reports right sided neck and shoulder pain with flexion, right rotation and side bend  UPPER EXTREMITY ROM:   UE ROM grossly WFL but reports right neck and shoulder pain with movement  UPPER EXTREMITY MMT:  MMT Right eval Left eval  Shoulder flexion 4 5  Shoulder extension 4 5  Shoulder abduction 4 5  Shoulder adduction    Shoulder internal rotation 4 5  Shoulder external rotation 4 5  Middle trapezius    Lower trapezius    Elbow flexion 5 5  Elbow extension 4 5  Wrist flexion    Wrist extension    Wrist ulnar deviation    Wrist radial deviation    Wrist pronation    Wrist supination    Grip strength 35 42   (Blank rows = not tested)  CERVICAL SPECIAL TESTS:  Cervical radicular testing positive on right  FUNCTIONAL TESTS:  DNF endurance: 3 seconds   TODAY'S TREATMENT:     [facility] Adult PT Treatment:                                                DATE: 05/20/2023 Therapeutic Exercise: Supine chin tuck 10 x 5 sec Seated shoulder blade squeezes 10 x 5 sec  PATIENT EDUCATION:  Education details: Exam findings, POC, HEP, purchasing a home TENS unit, use of TPDN in future sessions Person educated: Patient Education method: Explanation, Demonstration, Tactile cues, Verbal cues, and Handouts Education comprehension: verbalized understanding, returned demonstration, verbal cues required, tactile cues required, and needs further education  HOME EXERCISE PROGRAM: Access Code: [email or web address]    ASSESSMENT: CLINICAL IMPRESSION: Patient is a 52 y.o. female who was seen today for physical therapy evaluation and treatment for chronic right sided neck pain and radiculopathy with previous C6-7 fusion following cervical fracture. She  demonstrates limitations in cervical motion with increased muscle tightness and tenderness noted with palpation throughout right cervical and upper trap region. She exhibits postural deviations and gross strength deficit of the right shoulder and with gripping.    OBJECTIVE IMPAIRMENTS: decreased activity tolerance, decreased ROM, decreased strength, impaired flexibility, postural dysfunction, and pain.   ACTIVITY LIMITATIONS: carrying, lifting, sitting, sleeping, bathing, dressing, reach over head, and hygiene/grooming  PARTICIPATION LIMITATIONS: meal prep, cleaning, laundry, driving, shopping, and community activity  PERSONAL FACTORS: Fitness, Past/current experiences, and Time since onset of injury/illness/exacerbation are also affecting patient's functional outcome.   REHAB POTENTIAL: Good  CLINICAL DECISION MAKING: Stable/uncomplicated  EVALUATION COMPLEXITY: Low   GOALS: Goals reviewed with patient? Yes  SHORT TERM GOALS: Target date: 06/17/2023  Patient will be I with initial HEP in order to progress with therapy. Baseline: HEP provided at eval Goal status: INITIAL  2.  Patient will report neck and shoulder pain </= 5/10 in order to reduce functional limitations Baseline: 8/10 pain Goal status: INITIAL  LONG TERM GOALS: Target date: 07/15/2023  Patient will be I with final HEP to maintain progress from PT. Baseline: HEP provided at eval Goal  status: INITIAL  2.  Patient will report >/= 52% status on FOTO to indicate improved functional ability. Baseline: 43% functional status Goal status: INITIAL  3.  Patient will demonstrate right cervical rotation >/= 55 deg to reduce neck tension and improve driving ability Baseline: 40 deg Goal status: INITIAL  4.  Patient will demonstrate right shoulder strength >/= 4+/5 MMT and grip strength >/= 40 lbs in order to improve ability to perform household tasks and hold objects Baseline: right shoulder strength grossly 4/5 MMT, grip  strength 35 lbs Goal status: INITIAL  5. Patient will demonstrate DNF endurance >/= 15 seconds in order to improve postural control and reduce neck pain  Baseline: 3 seconds  Goal status: INITIAL   PLAN: PT FREQUENCY: 1x/week  PT DURATION: 8 weeks  PLANNED INTERVENTIONS: Therapeutic exercises, Therapeutic activity, Neuromuscular re-education, Balance training, Gait training, Patient/Family education, Self Care, Joint mobilization, Dry Needling, Cryotherapy, Moist heat, Taping, Manual therapy, and Re-evaluation ([employer] so cannot bill e-stim or traction)  PLAN FOR NEXT SESSION: Review HEP and progress PRN, manual/TPDN for right cervical paraspinals, suboccipitals, upper trap region, progress postural strength and endurance, right shoulder and grip strengthening   Rosana Hoes, PT, DPT, LAT, ATC 05/20/23  2:54 PM Phone: [phone] Fax: [phone]    Check all possible CPT codes: 29562 - PT Re-evaluation, 97110- Therapeutic Exercise, [phone]- Neuro Re-education, [phone] - Gait Training, 97140 - Manual Therapy, 97530 - Therapeutic Activities, and 97535 - Self Care    Check all conditions that are expected to impact treatment: {Conditions expected to impact treatment:Musculoskeletal disorders and Social determinants of health   If treatment provided at initial evaluation, no treatment charged due to lack of authorization.

## 2023-05-20 ENCOUNTER — Other Ambulatory Visit: Payer: Self-pay

## 2023-05-20 ENCOUNTER — Ambulatory Visit: Payer: Medicaid Other | Attending: Physician Assistant | Admitting: Physical Therapy

## 2023-05-20 ENCOUNTER — Encounter: Payer: Self-pay | Admitting: Physical Therapy

## 2023-05-20 DIAGNOSIS — G8929 Other chronic pain: Secondary | ICD-10-CM | POA: Diagnosis not present

## 2023-05-20 DIAGNOSIS — M542 Cervicalgia: Secondary | ICD-10-CM | POA: Diagnosis not present

## 2023-05-20 DIAGNOSIS — M6281 Muscle weakness (generalized): Secondary | ICD-10-CM | POA: Insufficient documentation

## 2023-05-20 DIAGNOSIS — M25511 Pain in right shoulder: Secondary | ICD-10-CM | POA: Diagnosis not present

## 2023-05-20 NOTE — Patient Instructions (Signed)
Access Code: [email or web address] URL: [email or web address] Date: 05/20/2023 Prepared by: Rosana Hoes  Exercises - Supine Cervical Retraction with Towel  - 2-3 x daily - 10 reps - 5 seconds hold - Seated Scapular Retraction  - 2-3 x daily - 10 reps - 5 seconds hold

## 2023-05-21 ENCOUNTER — Telehealth: Payer: Self-pay

## 2023-05-21 ENCOUNTER — Ambulatory Visit: Payer: Self-pay | Admitting: Surgery

## 2023-05-21 DIAGNOSIS — K429 Umbilical hernia without obstruction or gangrene: Secondary | ICD-10-CM | POA: Diagnosis not present

## 2023-05-21 NOTE — Telephone Encounter (Signed)
I tried calling pt. Her vm box is full.

## 2023-05-21 NOTE — Telephone Encounter (Signed)
   Pre-operative Risk Assessment    Patient Name: Jasmin Stephenson  DOB: 07/02/1971 MRN: [id number]      Request for Surgical Clearance    Procedure:   Hernia Surgery  Date of Surgery:  Clearance TBD                                 Surgeon:  Manus Rudd, MD Surgeon's Group or Practice Name:  [facility] Phone number:  [phone] Fax number:  [phone]   Type of Clearance Requested:   - Medical  - Pharmacy:  Hold Aspirin pt will need instructions on when/if to hold   Type of Anesthesia:  General    Additional requests/questions:    SignedZada Finders   05/21/2023, 10:53 AM

## 2023-05-21 NOTE — H&P (Signed)
Subjective    Chief Complaint: Umbilical Hernia       History of Present Illness: Jasmin Stephenson is a 52 y.o. female who is seen today as an office consultation at the request of Dr. Seymour Bars for evaluation of Umbilical Hernia .   This is a 52 year old female who underwent laparoscopic repair of a hiatal hernia in 2011 with primary repair of an umbilical hernia.  In 2020, she presented with periumbilical pain, reflux, nausea, and bloating.  We evaluated her fundoplication and there were no issues.  EGD was unremarkable.  On 02/07/19, she underwent open mesh repair of a recurrent umbilical hernia.  She had a 1.5 cm defect repaired with a small Ventralex mesh.  She did well after that surgery.  However, later in 2020, she developed gallbladder symptoms and underwent laparoscopic cholecystectomy with IOC.  Pathology confirmed chronic cholecystitis.  Her digestive symptoms improved.   Last year, she was evaluated for worsening pain just to the left of her umbilicus.  I obtained a CT scan in March 2023 that had no mention of a recurrent umbilical hernia and the radiology report.  She did have significant uterine fibroids. Last year, she underwent embolization of her uterine artery for fibroids.  In May 2023, she underwent total vaginal hysterectomy and bilateral salpingectomy by Dr. Donavan Foil.  The patient presents today continued to complain of pain to the left of her umbilicus.  She has begun to notice a bulge in this area.  Unfortunately, the patient continues to smoke a pack a day.  Last week, she had an episode where she had several days of fairly frequent large bowel movements.  She denies any diarrhea.  She states that normally she is fairly regular with the bowel movements and does not need to take any stool softeners or laxatives.  The frequent bowel movements have resolved.  She had previously been seen by [facility].  She denies any hematochezia or melena.   I examined her several weeks ago and noticed a  small recurrence to the left of her umbilicus.  We discussed smoking cessation or at least cutting back.  She has cut her cigarette intake at least in half in the last few weeks.  Her bowel movements have improved and returned to normal.  The hernia continues to cause some discomfort but it does not seem to be any larger than last visit.       Medical History: Past Medical History      Past Medical History:  Diagnosis Date   Anxiety     Arthritis          Problem List     Patient Active Problem List  Diagnosis   Cervical disc disorder at C6-C7 level with radiculopathy   History of MI (myocardial infarction)   Leiomyoma of uterus, unspecified        Past Surgical History       Past Surgical History:  Procedure Laterality Date   APPENDECTOMY       CHOLECYSTECTOMY            Allergies       Allergies  Allergen Reactions   Penicillins Nausea And Vomiting and Rash      Has patient had a PCN reaction causing immediate rash, facial/tongue/throat swelling, SOB or lightheadedness with hypotension: Yes Has patient had a PCN reaction causing severe rash involving mucus membranes or skin necrosis: Yes Has patient had a PCN reaction that required hospitalization No Has patient had a  PCN reaction occurring within the last 10 years: No If all of the above answers are "NO", then may proceed with Cephalosporin use. Has patient had a PCN reaction causing immediate rash, facial/tongue/throat swelling, SOB or lightheadedness with hypotension: Yes Has patient had a PCN reaction causing severe rash involving mucus membranes or skin necrosis: Yes Has patient had a PCN reaction that required hospitalization No Has patient had a PCN reaction occurring within the last 10 years: No If all of the above answers are "NO", then may proceed with Cephalosporin use.          Medications Ordered Prior to Encounter        Current Outpatient Medications on File Prior to Visit  Medication Sig  Dispense Refill   acetaminophen (TYLENOL) 500 MG tablet Take 500 mg by mouth       albuterol (PROVENTIL) 2.5 mg /3 mL (0.083 %) nebulizer solution Inhale into the lungs       aspirin 81 MG EC tablet Take by mouth       betamethasone, augmented, (DIPROLENE) 0.05 % lotion APPLY TO SCALP DAILY X 2 WK, THEN EVERY OTHER DAY. NOT TO FACE.       clobetasoL (TEMOVATE) 0.05 % ointment APPLY TO THE AFFECTED AREAS OF THE SCALP 3-4 TIMES WEEKLY       ergocalciferol, vitamin D2, 1,250 mcg (50,000 unit) capsule Take by mouth       hydrOXYzine (ATARAX) 25 MG tablet TAKE 1-2 TABLETS BY MOUTH EVERY 8 HOURS AS NEEDED FOR ITCHING.       ketoconazole (NIZORAL) 2 % shampoo Apply to scalp and shampoo as usual. Repeat 1-2 times weekly        No current facility-administered medications on file prior to visit.        Family History       Family History  Problem Relation Age of Onset   Skin cancer Father     High blood pressure (Hypertension) Father     Colon cancer Father          Tobacco Use History  Social History        Tobacco Use  Smoking Status Every Day   Types: Cigarettes  Smokeless Tobacco Never        Social History  Social History         Socioeconomic History   Marital status: Unknown  Tobacco Use   Smoking status: Every Day      Types: Cigarettes   Smokeless tobacco: Never  Substance and Sexual Activity   Alcohol use: Defer   Drug use: Defer        Objective:         Vitals:    05/21/23 1016  PainSc: 0-No pain    Physical Exam    Well-developed well-nourished no apparent distress Abdomen shows well-healed laparoscopic incisions No visible bulge Just to the left of the umbilicus, when the patient is standing with Valsalva maneuver, a small bulge is palpated.  This is spontaneously reducible.  No other ventral hernias noted.     Assessment and Plan:  Diagnoses and all orders for this visit:   Recurrent umbilical hernia       We will obtain cardiac  clearance We will plan a laparoscopic repair of this recurrent umbilical hernia with mesh.The surgical procedure has been discussed with the patient.  Potential risks, benefits, alternative treatments, and expected outcomes have been explained.  All of the patient's questions at this time have been answered.  The likelihood of reaching the patient's treatment goal is good.  The patient understand the proposed surgical procedure and wishes to proceed.   Continue smoking cessation efforts.       Jahshua Bonito Delbert Harness, MD  05/21/2023 11:03 AM

## 2023-05-21 NOTE — Telephone Encounter (Signed)
   Name: Jasmin Stephenson  DOB: 1971-05-20  MRN: [id number]  Primary Cardiologist: Olga Millers, MD  Chart reviewed as part of pre-operative protocol coverage. Because of Tajana Crotteau Vanallen's past medical history and time since last visit, she will require a follow-up in-office visit in order to better assess preoperative cardiovascular risk.  Pre-op covering staff: - Please schedule appointment and call patient to inform them. If patient already had an upcoming appointment within acceptable timeframe, please add "pre-op clearance" to the appointment notes so provider is aware. - Please contact requesting surgeon's office via preferred method (i.e, phone, fax) to inform them of need for appointment prior to surgery.  ASA recommendations can be made at the time of the in office visit.  Sharlene Dory, PA-C  05/21/2023, 11:58 AM

## 2023-05-24 NOTE — Telephone Encounter (Signed)
2nd attempt to reach pt to schedule in office visit. Vm full

## 2023-05-25 ENCOUNTER — Encounter: Payer: Self-pay | Admitting: *Deleted

## 2023-05-25 NOTE — Telephone Encounter (Signed)
3rd attempt to reach patient but VM mail is full. I will send to a letter to the address listed in EP[employer] to call our to schedule an in office visit to be cleared for procedure. I will remove from the pool.

## 2023-05-30 NOTE — Progress Notes (Unsigned)
Cardiology Clinic Note   Patient Name: Jasmin Stephenson Date of Encounter: 05/30/2023  Primary Care Provider:  Claiborne Rigg, NP Primary Cardiologist:  Jasmin Millers, MD  Patient Profile    52 y.o. female with a history of hyperlipidemia, PAD (ABIs in 05/2020 showing moderate arterial disease on the right but no significant arterial disease on the left.  Dopplers at that time showed inflow stenosis in the mid right external iliac artery of >50% and possible left peroneal artery disease.  No claudication), tobacco abuse GERD, asthma, and dysmenorrhea. She is s/p hysterectomy in April 2024.  Past Medical History    Past Medical History:  Diagnosis Date   Allergy    Anemia    1 unit   Anxiety    RA   Arthritis    and tendonitis   Asthma    Bipolar disorder (HCC)    Breast calcification, right    Cervical herniated disc 2017   Depression    Dysmenorrhea 11/2020   Family history of adverse reaction to anesthesia    GERD (gastroesophageal reflux disease)    History of blood transfusion    Hyperlipidemia    Menorrhagia 11/2020   Myocardial infarction Jasmin Stephenson) yrs ago   " Mild" no cardiologist   Neck fracture (HCC)    cervical neck fracture   Peripheral vascular disease (HCC)    Vitamin D deficiency    Past Surgical History:  Procedure Laterality Date   ABDOMINAL SURGERY     APPENDECTOMY  yrs ago   BLADDER SURGERY     CERVICAL DISC ARTHROPLASTY N/A 09/21/2016   Procedure: CERVICAL SIX- CERVICAL SEVEN ARTHROPLASTY;  Surgeon: Jasmin Halt Ditty, MD;  Location: [facility] OR;  Service: Neurosurgery;  Laterality: N/A;  C6-7 Arthroplasty   CHOLECYSTECTOMY N/A 10/10/2019   Procedure: LAPAROSCOPIC CHOLECYSTECTOMY WITH INTRAOPERATIVE CHOLANGIOGRAM;  Surgeon: Jasmin Rudd, MD;  Location: [facility] OR;  Service: General;  Laterality: N/A;   FOOT SURGERY Left    corrections on foot   IR ANGIOGRAM SELECTIVE EACH ADDITIONAL VESSEL  05/26/2021   IR ANGIOGRAM VISCERAL SELECTIVE  05/26/2021   IR EMBO  TUMOR ORGAN ISCHEMIA INFARCT INC GUIDE ROADMAPPING  05/26/2021   IR RADIOLOGIST EVAL & MGMT  03/19/2021   IR RADIOLOGIST EVAL & MGMT  06/10/2021   IR RADIOLOGIST EVAL & MGMT  01/05/2022   IR RENAL BILAT S&I MOD SED  05/26/2021   IR US GUIDE VASC ACCESS LEFT  05/26/2021   IRRIGATION AND DEBRIDEMENT ABSCESS Right 09/10/2014   Procedure: IRRIGATION AND DEBRIDEMENT RIGHT BREAST  ABSCESS;  Surgeon: Jasmin Ina, MD;  Location: [facility];  Service: General;  Laterality: Right;   SYNDACTYLIZATION Right 01/08/2021   Procedure: SYNDACTYLIZATION;  Surgeon: Jasmin Stephenson, DPM;  Location: [facility];  Service: Podiatry;  Laterality: Right;   TUBAL LIGATION     per pt    VAGINAL HYSTERECTOMY Bilateral 04/21/2022   Procedure: VAGINAL HYSTERECTOMY, BILATERAL SALPINGECTOMY;  Surgeon: Jasmin Fillers, MD;  Location: [facility];  Service: Gynecology;  Laterality: Bilateral;   WISDOM TOOTH EXTRACTION      Allergies  Allergies  Allergen Reactions   Penicillins Nausea And Vomiting and Rash    Has patient had a PCN reaction causing immediate rash, facial/tongue/throat swelling, SOB or lightheadedness with hypotension: Yes Has patient had a PCN reaction causing severe rash involving mucus membranes or skin necrosis: Yes Has patient had a PCN reaction that required hospitalization No Has patient had a PCN reaction occurring within the last  10 years: No If all of the above answers are "NO", then may proceed with Cephalosporin use.    History of Present Illness    [name] comes today for preoperative cardiology evaluation to have hernia surgery completed by [facility] surgery Dr. Manus Stephenson on date to be determined with questions concerning holding aspirin.  Will follow her for hypercholesterolemia and hypertension.  She has been without any complaints of chest pain, dyspnea on exertion, dizziness, presyncope.  She complains of right leg pain on occasion.  She is anxious to have her surgery  completed.  She requests refills on rosuvastatin and valsartan.  Home Medications    Current Outpatient Medications  Medication Sig Dispense Refill   albuterol (PROVENTIL) (2.5 MG/3ML) 0.083% nebulizer solution Take 3 mLs (2.5 mg total) by nebulization every 6 (six) hours as needed for wheezing or shortness of breath. 75 mL 12   albuterol (VENTOLIN HFA) 108 (90 Base) MCG/ACT inhaler Inhale 2 puffs into the lungs every 4 (four) hours as needed for wheezing or shortness of breath (cough, shortness of breath or wheezing.). 1 each 11   aspirin EC 81 MG tablet Take 1 tablet (81 mg total) by mouth daily. 150 tablet 2   benzoyl peroxide 5 % external liquid Apply topically 2 (two) times daily. 240 g 1   Blood Pressure Monitor DEVI Please provide patient with insurance approved blood pressure monitor ICD 10 I10.0 1 each 0   ketoconazole (NIZORAL) 2 % shampoo Apply 1 application. topically 2 (two) times a week.     rosuvastatin (CRESTOR) 10 MG tablet Take 1 tablet (10 mg total) by mouth daily. 30 tablet 10   triamcinolone cream (KENALOG) 0.1 % Apply 1 Application topically 2 (two) times daily. For eczema on elbows 30 g 1   valsartan (DIOVAN) 80 MG tablet Take 1 tablet (80 mg total) by mouth daily. 90 tablet 3   Vitamin D, Ergocalciferol, (DRISDOL) 1.25 MG (50000 UNIT) CAPS capsule Take 1 capsule (50,000 Units total) by mouth every 7 (seven) days. 5 capsule 6   No current facility-administered medications for this visit.     Family History    Family History  Problem Relation Age of Onset   Diabetes Father    CAD Father    Breast cancer Sister 44   Alcohol abuse Neg Hx    Arthritis Neg Hx    Asthma Neg Hx    Birth defects Neg Hx    Cancer Neg Hx    COPD Neg Hx    Depression Neg Hx    Drug abuse Neg Hx    Early death Neg Hx    Hearing loss Neg Hx    Heart disease Neg Hx    Hyperlipidemia Neg Hx    Hypertension Neg Hx    Kidney disease Neg Hx    Learning disabilities Neg Hx    Mental  illness Neg Hx    Mental retardation Neg Hx    Miscarriages / Stillbirths Neg Hx    Stroke Neg Hx    Vision loss Neg Hx    She indicated that her mother is alive. She indicated that her father is deceased. She indicated that her sister is alive. She indicated that the status of her neg hx is unknown.  Social History    Social History   Socioeconomic History   Marital status: Single    Spouse name: Not on file   Number of children: 3   Years of education: Not on  file   Highest education level: Not on file  Occupational History   Not on file  Tobacco Use   Smoking status: Every Day    Packs/day: 0.75    Years: 10.00    Additional pack years: 0.00    Total pack years: 7.50    Types: Cigarettes   Smokeless tobacco: Never  Vaping Use   Vaping Use: Never used  Substance and Sexual Activity   Alcohol use: Yes    Comment: occ   Drug use: Not Currently    Types: Marijuana    Comment: 04/20/22- none in a while, CBD prn   Sexual activity: Not Currently    Birth control/protection: Surgical  Other Topics Concern   Not on file  Social History Narrative   Not on file   Social Determinants of Health   Financial Resource Strain: Not on file  Food Insecurity: Not on file  Transportation Needs: Not on file  Physical Activity: Not on file  Stress: Not on file  Social Connections: Not on file  Intimate Partner Violence: Not on file     Review of Systems    General:  No chills, fever, night sweats or weight changes.  Cardiovascular:  No chest pain, dyspnea on exertion, edema, orthopnea, palpitations, paroxysmal nocturnal dyspnea. Dermatological: No rash, lesions/masses Respiratory: No cough, dyspnea Urologic: No hematuria, dysuria Abdominal:   No nausea, vomiting, diarrhea, bright red blood per rectum, melena, or hematemesis Neurologic:  No visual changes, wkns, changes in mental status. All other systems reviewed and are otherwise negative except as noted above.      Physical Exam    VS:  LMP 03/27/2022 Comment: DOS UPREG NEGATIVE , BMI There is no height or weight on file to calculate BMI.     GEN: Well nourished, well developed, in no acute distress. HEENT: normal. Neck: Supple, no JVD, carotid bruits, or masses. Cardiac: RRR, no murmurs, rubs, or gallops. No clubbing, cyanosis, edema.  Radials/DP/PT 2+ and equal bilaterally.  Respiratory:  Respirations regular and unlabored, clear to auscultation bilaterally. GI: Soft, nontender, nondistended, BS + x 4. MS: no deformity or atrophy. Skin: warm and dry, no rash. Neuro:  Strength and sensation are intact. Psych: Normal affect.  Accessory Clinical Findings    ECG personally reviewed by me today-normal sinus rhythm with left anterior fascicular block, heart rate of 75 bpm.  Lab Results  Component Value Date   WBC 7.7 08/12/2022   HGB 13.6 08/12/2022   HCT 42.3 08/12/2022   MCV 86 08/12/2022   PLT 416 08/12/2022   Lab Results  Component Value Date   CREATININE 1.08 (H) 11/16/2022   BUN 9 11/16/2022   NA 143 11/16/2022   K 4.0 11/16/2022   CL 103 11/16/2022   CO2 25 11/16/2022   Lab Results  Component Value Date   ALT 9 11/16/2022   AST 13 11/16/2022   ALKPHOS 96 11/16/2022   BILITOT 0.2 11/16/2022   Lab Results  Component Value Date   CHOL 195 08/12/2022   HDL 43 08/12/2022   LDLCALC 128 (H) 08/12/2022   TRIG 135 08/12/2022   CHOLHDL 4.5 (H) 08/12/2022    Lab Results  Component Value Date   HGBA1C 5.7 (H) 08/12/2022    Review of Prior Studies:  ABIs/TBIs 05/31/2020: Summary:  Right: Resting right ankle-brachial index indicates moderate right lower extremity arterial disease. The right toe-brachial index is abnormal.   Left: Resting left ankle-brachial index is within normal range. No evidence of significant  left lower extremity arterial disease. The left toe-brachial index is normal. _______________   Lower Extremity Arterial Ultrasound 07/15/2020: Summary:  Right:  Patent right lower extremity arterial system. Possible left peroneal artery disease. 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  _______________   Echocardiogram 04/09/2021: Impressions:  1. Left ventricular ejection fraction, by estimation, is 60 to 65%. The  left ventricle has normal function. The left ventricle has no regional  wall motion abnormalities. There is mild left ventricular hypertrophy.  Left ventricular diastolic parameters  were normal. The average left ventricular global longitudinal strain is  -18.4 %. The global longitudinal strain is normal.   2. Right ventricular systolic function is normal. The right ventricular  size is normal. There is normal pulmonary artery systolic pressure.   3. The mitral valve is normal in structure. Trivial mitral valve  regurgitation. No evidence of mitral stenosis.   4. The aortic valve is tricuspid. Aortic valve regurgitation is trivial.  No aortic stenosis is present.   5. Aortic dilatation noted. There is borderline dilatation of the aortic  root, measuring 39 mm.   6. The inferior vena cava is normal in size with greater than 50%  respiratory variability, suggesting right atrial pressure of 3 mmHg.  Assessment & Plan   1.  Preoperative cardiac evaluation:  Chart reviewed as part of pre-operative protocol coverage. Given past medical history and time since last visit, based on ACC/AHA guidelines, OLIVE FATEMI would be at acceptable risk for the planned procedure without further cardiovascular testing.  The patient will need to hold aspirin 72 hours prior to surgery and start again as soon as possible postoperatively once hemodynamically stable.  2.  Hypertension: Elevated on this office visit but has had to wait to be seen.  Patient is given refills on valsartan.  Low-sodium diet is recommended.  She is to call us if blood pressure remains elevated at which time we will need to adjust medications.  No medication  adjustments preoperatively at this time.  3.  Hypercholesterolemia: She has started seeing a new PCP and labs will be drawn at their office.  I have reviewed prior panel from August 12, 2022 total cholesterol 195 HDL 43 LDL 128.  She is given refills on rosuvastatin.           Signed, Bettey Mare. Liborio Nixon, ANP, AACC   05/30/2023 3:00 PM      Office [phone] Fax [phone]  Notice: This dictation was prepared with Dragon dictation along with smaller phrase technology. Any transcriptional errors that result from this process are unintentional and may not be corrected upon review.

## 2023-05-31 ENCOUNTER — Ambulatory Visit: Payer: Medicaid Other | Attending: Adult Health | Admitting: Adult Health

## 2023-05-31 ENCOUNTER — Encounter: Payer: Self-pay | Admitting: Adult Health

## 2023-05-31 VITALS — BP 158/92 | HR 75 | Ht 65.0 in | Wt 200.2 lb

## 2023-05-31 DIAGNOSIS — I10 Essential (primary) hypertension: Secondary | ICD-10-CM | POA: Diagnosis not present

## 2023-05-31 DIAGNOSIS — Z01810 Encounter for preprocedural cardiovascular examination: Secondary | ICD-10-CM | POA: Diagnosis not present

## 2023-05-31 DIAGNOSIS — E7800 Pure hypercholesterolemia, unspecified: Secondary | ICD-10-CM

## 2023-05-31 MED ORDER — VALSARTAN 80 MG PO TABS
80.0000 mg | ORAL_TABLET | Freq: Every day | ORAL | 3 refills | Status: DC
Start: 2023-05-31 — End: 2024-02-21

## 2023-05-31 MED ORDER — ROSUVASTATIN CALCIUM 10 MG PO TABS: 10.0000 mg | ORAL_TABLET | Freq: Every day | ORAL | 3 refills | Status: DC

## 2023-05-31 NOTE — Addendum Note (Signed)
Addended by: Myna Hidalgo A on: 05/31/2023 04:53 PM   Modules accepted: Orders

## 2023-05-31 NOTE — Patient Instructions (Signed)
Medication Instructions:  No Changes *If you need a refill on your cardiac medications before your next appointment, please call your pharmacy*   Lab Work: No Labs If you have labs (blood work) drawn today and your tests are completely normal, you will receive your results only by: MyChart Message (if you have MyChart) OR A paper copy in the mail If you have any lab test that is abnormal or we need to change your treatment, we will call you to review the results.   Testing/Procedures: No Testing   Follow-Up: At  [facility], you and your health needs are our priority.  As part of our continuing mission to provide you with exceptional heart care, we have created designated Provider Care Teams.  These Care Teams include your primary Cardiologist (physician) and Advanced Practice Providers (APPs -  Physician Assistants and Nurse Practitioners) who all work together to provide you with the care you need, when you need it.  We recommend signing up for the patient portal called "MyChart".  Sign up information is provided on this After Visit Summary.  MyChart is used to connect with patients for Virtual Visits (Telemedicine).  Patients are able to view lab/test results, encounter notes, upcoming appointments, etc.  Non-urgent messages can be sent to your provider as well.   To learn more about what you can do with MyChart, go to [email or web address].    Your next appointment:   1 year(s)  Provider:   Brian Crenshaw, MD   

## 2023-06-01 ENCOUNTER — Telehealth: Payer: Self-pay

## 2023-06-01 ENCOUNTER — Ambulatory Visit: Payer: Medicaid Other

## 2023-06-01 NOTE — Telephone Encounter (Signed)
Attempted to contact patient regarding missed PT appointment. Voicemail box is full, so unable to leave voicemail.   Letitia Libra, PT, DPT, ATC 06/01/23 4:34 PM

## 2023-06-07 ENCOUNTER — Telehealth: Payer: Self-pay

## 2023-06-07 NOTE — Telephone Encounter (Signed)
Called pt to find out about her Mammo order. No answer and could not lvm. KH

## 2023-06-09 ENCOUNTER — Ambulatory Visit: Payer: Medicaid Other | Admitting: Physical Therapy

## 2023-06-09 ENCOUNTER — Telehealth: Payer: Self-pay | Admitting: Physical Therapy

## 2023-06-09 NOTE — Telephone Encounter (Signed)
Attempted to contact patient regarding missed PT appointment. Voicemail box is full, so unable to leave voicemail.   Rosana Hoes, PT, DPT, LAT, ATC 06/09/23  3:08 PM Phone: [phone] Fax: [phone]

## 2023-06-11 ENCOUNTER — Other Ambulatory Visit: Payer: Self-pay | Admitting: Obstetrics and Gynecology

## 2023-06-11 ENCOUNTER — Ambulatory Visit
Admission: RE | Admit: 2023-06-11 | Discharge: 2023-06-11 | Disposition: A | Discharge status: Home / Self Care | Payer: Medicaid Other | Source: Ambulatory Visit | Attending: Obstetrics and Gynecology | Admitting: Obstetrics and Gynecology

## 2023-06-11 DIAGNOSIS — N6011 Diffuse cystic mastopathy of right breast: Secondary | ICD-10-CM | POA: Diagnosis not present

## 2023-06-11 DIAGNOSIS — N6310 Unspecified lump in the right breast, unspecified quadrant: Secondary | ICD-10-CM

## 2023-06-11 DIAGNOSIS — N6341 Unspecified lump in right breast, subareolar: Secondary | ICD-10-CM | POA: Diagnosis not present

## 2023-06-11 DIAGNOSIS — N611 Abscess of the breast and nipple: Secondary | ICD-10-CM

## 2023-06-22 NOTE — Therapy (Signed)
OUTPATIENT PHYSICAL THERAPY TREATMENT   Patient Name: Jasmin Stephenson MRN: [id number] DOB:05-18-1971, 52 y.o., female Today's Date: 06/22/2023   END OF SESSION:    Past Medical History:  Diagnosis Date   Allergy    Anemia    1 unit   Anxiety    RA   Arthritis    and tendonitis   Asthma    Bipolar disorder (HCC)    Breast calcification, right    Cervical herniated disc 2017   Depression    Dysmenorrhea 11/2020   Family history of adverse reaction to anesthesia    GERD (gastroesophageal reflux disease)    History of blood transfusion    Hyperlipidemia    Menorrhagia 11/2020   Myocardial infarction [facility]) yrs ago   " Mild" no cardiologist   Neck fracture (HCC)    cervical neck fracture   Peripheral vascular disease (HCC)    Vitamin D deficiency    Past Surgical History:  Procedure Laterality Date   ABDOMINAL SURGERY     APPENDECTOMY  yrs ago   BLADDER SURGERY     BREAST EXCISIONAL BIOPSY Right    CERVICAL DISC ARTHROPLASTY N/A 09/21/2016   Procedure: CERVICAL SIX- CERVICAL SEVEN ARTHROPLASTY;  Surgeon: Loura Halt Ditty, MD;  Location: [facility] OR;  Service: Neurosurgery;  Laterality: N/A;  C6-7 Arthroplasty   CHOLECYSTECTOMY N/A 10/10/2019   Procedure: LAPAROSCOPIC CHOLECYSTECTOMY WITH INTRAOPERATIVE CHOLANGIOGRAM;  Surgeon: Manus Rudd, MD;  Location: [facility] OR;  Service: General;  Laterality: N/A;   FOOT SURGERY Left    corrections on foot   IR ANGIOGRAM SELECTIVE EACH ADDITIONAL VESSEL  05/26/2021   IR ANGIOGRAM VISCERAL SELECTIVE  05/26/2021   IR EMBO TUMOR ORGAN ISCHEMIA INFARCT INC GUIDE ROADMAPPING  05/26/2021   IR RADIOLOGIST EVAL & MGMT  03/19/2021   IR RADIOLOGIST EVAL & MGMT  06/10/2021   IR RADIOLOGIST EVAL & MGMT  01/05/2022   IR RENAL BILAT S&I MOD SED  05/26/2021   IR US GUIDE VASC ACCESS LEFT  05/26/2021   IRRIGATION AND DEBRIDEMENT ABSCESS Right 09/10/2014   Procedure: IRRIGATION AND DEBRIDEMENT RIGHT BREAST  ABSCESS;  Surgeon: Atilano Ina, MD;   Location: [facility];  Service: General;  Laterality: Right;   SYNDACTYLIZATION Right 01/08/2021   Procedure: SYNDACTYLIZATION;  Surgeon: Park Liter, DPM;  Location: [facility] [place];  Service: Podiatry;  Laterality: Right;   TUBAL LIGATION     per pt    VAGINAL HYSTERECTOMY Bilateral 04/21/2022   Procedure: VAGINAL HYSTERECTOMY, BILATERAL SALPINGECTOMY;  Surgeon: Warden Fillers, MD;  Location: [facility] [place] OR;  Service: Gynecology;  Laterality: Bilateral;   WISDOM TOOTH EXTRACTION     Patient Active Problem List   Diagnosis Date Noted   S/P vaginal hysterectomy 04/21/2022   Uterine leiomyoma 05/26/2021   Fibroid uterus 02/25/2021   No-show for appointment 02/19/2021   Scalp pruritus 02/18/2021   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 s/p previous cx surgery  THERAPY DIAG:  No diagnosis found.  Rationale for Evaluation and Treatment: Rehabilitation  ONSET DATE: 09/21/2016   SUBJECTIVE:                                                                                                                                                                                                        SUBJECTIVE STATEMENT: Patient reports neck pain and right arm weakness, it goes to sleep and acts like it doesn't want to move. She did have neck surgery in 2017 and has had  issues with her right neck and arm since then. She has been having a lot of pain in her right neck and shoulder, and she has been dropping things with the right hand like her grip isn't strong. She reports she will get numbness like the arm goes to sleep every once in a while and it feels like the whole arm. She has trouble sleeping and feels like she has to constantly turn in bed because of the pain. She denies any recent MRI or other treatments such as injections for her neck. She did have a unit with pads that she used many years ago but it does not work anymore and she does not have any of the pads, but this did help.  Hand dominance: Left  PERTINENT HISTORY:  C6-7 fusion 2017  PAIN:  Are you having pain? Yes:  NPRS scale: 8/10 Pain location: Neck, right arm Pain description: "Disturbing," numbness Aggravating factors: Activity, all neck movement, laying in bed Relieving factors: Tylenol, heat  PRECAUTIONS: None  PATIENT GOALS: Pain relief and be able to use right arm normally   OBJECTIVE:  PATIENT SURVEYS:  FOTO 43% functional status  SENSATION: Patient reports global sensation deficits of right lower arm and hand, no specific dermatomal pattern noted  POSTURE:   Rounded shoulder and forward head posture  PALPATION: Tender to palpation right cervical paraspinals, upper trap, and suboccipital region with increase in muscular tension noted  CERVICAL ROM:   Active ROM A/PROM (deg) eval  Flexion 45  Extension 20  Right lateral flexion 20  Left lateral flexion 25  Right rotation 35  Left rotation 50   (Blank rows = not tested)  Patient reports right sided neck and shoulder pain with flexion, right rotation and side bend  UPPER EXTREMITY ROM:   UE ROM grossly WFL but reports right neck and shoulder pain with movement  UPPER EXTREMITY MMT:  MMT Right eval Left eval  Shoulder flexion 4 5  Shoulder extension 4  5  Shoulder abduction 4 5  Shoulder adduction     Shoulder internal rotation 4 5  Shoulder external rotation 4 5  Middle trapezius    Lower trapezius    Elbow flexion 5 5  Elbow extension 4 5  Wrist flexion    Wrist extension    Wrist ulnar deviation    Wrist radial deviation    Wrist pronation    Wrist supination    Grip strength 35 42   (Blank rows = not tested)  CERVICAL SPECIAL TESTS:  Cervical radicular testing positive on right  FUNCTIONAL TESTS:  DNF endurance: 3 seconds   TODAY'S TREATMENT:     [facility] Adult PT Treatment:                                                DATE: 06/23/2023 Therapeutic Exercise: Supine chin tuck 10 x 5 sec Seated shoulder blade squeezes 10 x 5 sec   [facility] Adult PT Treatment:                                                DATE: 05/20/2023 Therapeutic Exercise: Supine chin tuck 10 x 5 sec Seated shoulder blade squeezes 10 x 5 sec  PATIENT EDUCATION:  Education details: HEP Person educated: Patient Education method: Programmer, multimedia, Facilities manager, Actor cues, Verbal cues, and Handouts Education comprehension: verbalized understanding, returned demonstration, verbal cues required, tactile cues required, and needs further education  HOME EXERCISE PROGRAM: Access Code: [email or web address]    ASSESSMENT: CLINICAL IMPRESSION: Patient tolerated therapy well with no adverse effects. *** Patient would benefit from continued skilled PT to progress her mobility and strength in order to reduce pain and maximize functional ability.   Patient is a 52 y.o. female who was seen today for physical therapy evaluation and treatment for chronic right sided neck pain and radiculopathy with previous C6-7 fusion following cervical fracture. She demonstrates limitations in cervical motion with increased muscle tightness and tenderness noted with palpation throughout right cervical and upper trap region. She exhibits postural deviations and gross strength deficit of the right shoulder and with gripping.    OBJECTIVE  IMPAIRMENTS: decreased activity tolerance, decreased ROM, decreased strength, impaired flexibility, postural dysfunction, and pain.   ACTIVITY LIMITATIONS: carrying, lifting, sitting, sleeping, bathing, dressing, reach over head, and hygiene/grooming  PARTICIPATION LIMITATIONS: meal prep, cleaning, laundry, driving, shopping, and community activity  PERSONAL FACTORS: Fitness, Past/current experiences, and Time since onset of injury/illness/exacerbation are also affecting patient's functional outcome.    GOALS: Goals reviewed with patient? Yes  SHORT TERM GOALS: Target date: 06/17/2023  Patient will be I with initial HEP in order to progress with therapy. Baseline: HEP provided at eval Goal status: INITIAL  2.  Patient will report neck and shoulder pain </= 5/10 in order to reduce functional limitations Baseline: 8/10 pain Goal status: INITIAL  LONG TERM GOALS: Target date: 07/15/2023  Patient will be I with final HEP to maintain progress from PT. Baseline: HEP provided at eval Goal status: INITIAL  2.  Patient will report >/= 52% status on FOTO to indicate improved functional ability. Baseline: 43% functional status Goal status: INITIAL  3.  Patient will demonstrate right cervical rotation >/= 55 deg to reduce neck tension  and improve driving ability Baseline: 40 deg Goal status: INITIAL  4.  Patient will demonstrate right shoulder strength >/= 4+/5 MMT and grip strength >/= 40 lbs in order to improve ability to perform household tasks and hold objects Baseline: right shoulder strength grossly 4/5 MMT, grip strength 35 lbs Goal status: INITIAL  5. Patient will demonstrate DNF endurance >/= 15 seconds in order to improve postural control and reduce neck pain  Baseline: 3 seconds  Goal status: INITIAL   PLAN: PT FREQUENCY: 1x/week  PT DURATION: 8 weeks  PLANNED INTERVENTIONS: Therapeutic exercises, Therapeutic activity, Neuromuscular re-education, Balance training, Gait  training, Patient/Family education, Self Care, Joint mobilization, Dry Needling, Cryotherapy, Moist heat, Taping, Manual therapy, and Re-evaluation ([employer] so cannot bill e-stim or traction)  PLAN FOR NEXT SESSION: Review HEP and progress PRN, manual/TPDN for right cervical paraspinals, suboccipitals, upper trap region, progress postural strength and endurance, right shoulder and grip strengthening   Rosana Hoes, PT, DPT, LAT, ATC 06/22/23  2:07 PM Phone: [phone] Fax: [phone]

## 2023-06-23 ENCOUNTER — Encounter: Payer: Self-pay | Admitting: Physical Therapy

## 2023-06-23 ENCOUNTER — Ambulatory Visit: Payer: Medicaid Other | Attending: Physician Assistant | Admitting: Physical Therapy

## 2023-06-23 ENCOUNTER — Other Ambulatory Visit: Payer: Self-pay

## 2023-06-23 DIAGNOSIS — M542 Cervicalgia: Secondary | ICD-10-CM | POA: Diagnosis not present

## 2023-06-23 DIAGNOSIS — M25511 Pain in right shoulder: Secondary | ICD-10-CM | POA: Insufficient documentation

## 2023-06-23 DIAGNOSIS — G8929 Other chronic pain: Secondary | ICD-10-CM | POA: Diagnosis not present

## 2023-06-23 DIAGNOSIS — M6281 Muscle weakness (generalized): Secondary | ICD-10-CM | POA: Diagnosis not present

## 2023-06-23 NOTE — Patient Instructions (Signed)
Access Code: [email or web address] URL: [email or web address] Date: 06/23/2023 Prepared by: Rosana Hoes  Exercises - Supine Cervical Retraction with Towel  - 2-3 x daily - 10 reps - 5 seconds hold - Seated Scapular Retraction  - 2-3 x daily - 10 reps - 5 seconds hold - Seated Cervical Sidebending Stretch  - 2-3 x daily - 3 reps - 20 seconds hold

## 2023-07-02 ENCOUNTER — Ambulatory Visit: Payer: Medicaid Other

## 2023-07-02 DIAGNOSIS — G8929 Other chronic pain: Secondary | ICD-10-CM

## 2023-07-02 DIAGNOSIS — M542 Cervicalgia: Secondary | ICD-10-CM

## 2023-07-02 DIAGNOSIS — M6281 Muscle weakness (generalized): Secondary | ICD-10-CM | POA: Diagnosis not present

## 2023-07-02 DIAGNOSIS — M25511 Pain in right shoulder: Secondary | ICD-10-CM | POA: Diagnosis not present

## 2023-07-02 NOTE — Therapy (Addendum)
 OUTPATIENT PHYSICAL THERAPY TREATMENT  DISCHARGE   Patient Name: Jasmin Stephenson MRN: [id number] DOB:[birth year], 52 y.o., female Today's Date: 07/02/2023   END OF SESSION:  PT End of Session - 07/02/23 1012     Visit Number 3    Number of Visits 9    Date for PT Re-Evaluation 07/15/23    Authorization Type [employer]    Authorization Time Period 05/24/2023 - 07/22/2023    Authorization - Visit Number 2    Authorization - Number of Visits 7    PT Start Time 1013    PT Stop Time 1103    PT Time Calculation (min) 50 min    Activity Tolerance Patient tolerated treatment well    Behavior During Therapy [facility] for tasks assessed/performed               Past Medical History:  Diagnosis Date   Allergy    Anemia    1 unit   Anxiety    RA   Arthritis    and tendonitis   Asthma    Bipolar disorder (HCC)    Breast calcification, right    Cervical herniated disc 2017   Depression    Dysmenorrhea 11/2020   Family history of adverse reaction to anesthesia    GERD (gastroesophageal reflux disease)    History of blood transfusion    Hyperlipidemia    Menorrhagia 11/2020   Myocardial infarction [facility] yrs ago   " Mild" no cardiologist   Neck fracture (HCC)    cervical neck fracture   Peripheral vascular disease (HCC)    Vitamin D deficiency    Past Surgical History:  Procedure Laterality Date   ABDOMINAL SURGERY     APPENDECTOMY  yrs ago   BLADDER SURGERY     BREAST EXCISIONAL BIOPSY Right    CERVICAL DISC ARTHROPLASTY N/A 09/21/2016   Procedure: CERVICAL SIX- CERVICAL SEVEN ARTHROPLASTY;  Surgeon: Loura Halt Ditty, MD;  Location: [facility] OR;  Service: Neurosurgery;  Laterality: N/A;  C6-7 Arthroplasty   CHOLECYSTECTOMY N/A 10/10/2019   Procedure: LAPAROSCOPIC CHOLECYSTECTOMY WITH INTRAOPERATIVE CHOLANGIOGRAM;  Surgeon: Manus Rudd, MD;  Location: [facility] OR;  Service: General;  Laterality: N/A;   FOOT SURGERY Left    corrections on foot   IR ANGIOGRAM SELECTIVE  EACH ADDITIONAL VESSEL  05/26/2021   IR ANGIOGRAM VISCERAL SELECTIVE  05/26/2021   IR EMBO TUMOR ORGAN ISCHEMIA INFARCT INC GUIDE ROADMAPPING  05/26/2021   IR RADIOLOGIST EVAL & MGMT  03/19/2021   IR RADIOLOGIST EVAL & MGMT  06/10/2021   IR RADIOLOGIST EVAL & MGMT  01/05/2022   IR RENAL BILAT S&I MOD SED  05/26/2021   IR US GUIDE VASC ACCESS LEFT  05/26/2021   IRRIGATION AND DEBRIDEMENT ABSCESS Right 09/10/2014   Procedure: IRRIGATION AND DEBRIDEMENT RIGHT BREAST  ABSCESS;  Surgeon: Atilano Ina, MD;  Location: [facility];  Service: General;  Laterality: Right;   SYNDACTYLIZATION Right 01/08/2021   Procedure: SYNDACTYLIZATION;  Surgeon: Park Liter, DPM;  Location: [facility] [place];  Service: Podiatry;  Laterality: Right;   TUBAL LIGATION     per pt    VAGINAL HYSTERECTOMY Bilateral 04/21/2022   Procedure: VAGINAL HYSTERECTOMY, BILATERAL SALPINGECTOMY;  Surgeon: Warden Fillers, MD;  Location: [facility] OR;  Service: Gynecology;  Laterality: Bilateral;   WISDOM TOOTH EXTRACTION     Patient Active Problem List   Diagnosis Date Noted   S/P vaginal hysterectomy 04/21/2022   Uterine leiomyoma 05/26/2021   Fibroid uterus 02/25/2021  No-show for appointment 02/19/2021   Scalp pruritus 02/18/2021   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 s/p previous cx  surgery  THERAPY DIAG:  Cervicalgia  Chronic right shoulder pain  Muscle weakness (generalized)  Rationale for Evaluation and Treatment: Rehabilitation  ONSET DATE: 09/21/2016   SUBJECTIVE:                                                                                                                                                                                                        SUBJECTIVE STATEMENT: "So so I guess." She has been completing exercises, but isn't sure they are helping. She does not think she wants to be needled again.   Hand dominance: Left  PERTINENT HISTORY:  C6-7 fusion 2017  PAIN:  Are you having pain? Yes:  NPRS scale: 7/10 Pain location: Neck, right arm Pain description: numbness Aggravating factors: Activity, all neck movement, laying in bed Relieving factors: Tylenol, heat  PRECAUTIONS: None  PATIENT GOALS: Pain relief and be able to use right arm normally   OBJECTIVE:  PATIENT SURVEYS:  FOTO 43% functional status  SENSATION: Patient reports global sensation deficits of right lower arm and hand, no specific dermatomal pattern noted  POSTURE:   Rounded shoulder and forward head posture  PALPATION: Tender to palpation right cervical paraspinals, upper trap, and suboccipital region with increase in muscular tension noted  CERVICAL ROM:   Active ROM A/PROM (deg) eval   06/23/2023 07/02/23  Flexion 45  30  Extension 20  30  Right lateral flexion 20    Left lateral flexion 25    Right rotation 35 45   Left rotation 50 55    (Blank rows = not tested)  Patient reports right sided neck and shoulder pain with flexion, right rotation and side bend  UPPER EXTREMITY ROM:   UE ROM grossly WFL but reports right neck and shoulder pain with movement  UPPER EXTREMITY MMT:  MMT Right eval Left eval  Shoulder flexion 4 5  Shoulder extension 4 5  Shoulder abduction 4 5  Shoulder adduction    Shoulder internal rotation 4 5  Shoulder  external rotation 4 5  Middle trapezius    Lower trapezius    Elbow flexion 5 5  Elbow extension 4 5  Wrist flexion    Wrist extension    Wrist ulnar deviation    Wrist radial deviation    Wrist pronation    Wrist supination    Grip strength 35 42   (Blank rows = not tested)  CERVICAL SPECIAL TESTS:  Cervical radicular testing positive on right  FUNCTIONAL TESTS:  DNF endurance: 3 seconds   TODAY'S TREATMENT:     [facility] Adult PT Treatment:                                                DATE: 07/02/23 Therapeutic Exercise: UBE level 1 x 4 minutes fwd only  Supine chin tuck 2 x 10; 5 sec hold  Cervical extension SNAG x 10  Putty grip x 10  Tricep extension 2 x 10 yellow band  Bilateral shoulder ER yellow band 2 x 10  Supine shoulder flexion with dowel x 10  Chest press with dowel x 10  Updated HEP   Modalities: MHP to C-spine in sitting x 10 minutes     [facility] Adult PT Treatment:                                                DATE: 06/23/2023 Therapeutic Exercise: UBE L1 x 4 min (fwd/bwd) while taking subjective Supine chin tuck 2 x 10 with 5 sec hold Supine horizontal abduction with yellow 2 x 10 Seated shoulder blade squeezes 2 x 10 with 5 sec hold Seated upper trap stretch 2 x 20 sec each Manual: Skilled palpation and monitoring of muscle tension while performing TPDN Suboccipital release and gentle cervical traction x 3 bouts Passive upper trap and levator scap stretch Trigger Point Dry Needling Treatment: Pre-treatment instruction: Patient instructed on dry needling rationale, procedures, and possible side effects including pain during treatment (achy,cramping feeling), bruising, drop of blood, lightheadedness, nausea, sweating. Patient Consent Given: Yes Education handout provided: No Muscles treated: Bilateral upper trap, right suboccipital  Needle size and number: .30x35mm x 5 Electrical stimulation performed: No Parameters: N/A Treatment response/outcome:  Twitch response elicited and Palpable decrease in muscle tension Post-treatment instructions: Patient instructed to expect possible mild to moderate muscle soreness later today and/or tomorrow. Patient instructed in methods to reduce muscle soreness and to continue prescribed HEP. If patient was dry needled over the lung field, patient was instructed on signs and symptoms of pneumothorax and, however unlikely, to see immediate medical attention should they occur. Patient was also educated on signs and symptoms of infection and to seek medical attention should they occur. Patient verbalized understanding of these instructions and education.   [facility] Adult PT Treatment:                                                DATE: 05/20/2023 Therapeutic Exercise: Supine  chin tuck 10 x 5 sec Seated shoulder blade squeezes 10 x 5 sec  PATIENT EDUCATION:  Education details: HEP update Person educated: Patient Education method: Explanation, Demonstration, Tactile cues, Verbal cues, and Handouts Education comprehension: verbalized understanding, returned demonstration, verbal cues required, tactile cues required, and needs further education  HOME EXERCISE PROGRAM: Access Code: [email or web address]    ASSESSMENT: CLINICAL IMPRESSION: Patient tolerated therapy well with no adverse effects. Slight improvement noted in cervical extension and a regression noted in cervical flexion AROM compared to initial evaluation. Continued emphasis on postural/RUE strengthening and neck/shoulder mobility with fairly good tolerance as she continues to endorse neck and RUE pain throughout session. Consistent postural cues required with strengthening. HEP was updated to include further strengthening. Finished session with MHP to assist in overall pain reduction.    OBJECTIVE IMPAIRMENTS: decreased activity tolerance, decreased ROM, decreased strength, impaired flexibility, postural dysfunction, and pain.   ACTIVITY LIMITATIONS: carrying,  lifting, sitting, sleeping, bathing, dressing, reach over head, and hygiene/grooming  PARTICIPATION LIMITATIONS: meal prep, cleaning, laundry, driving, shopping, and community activity  PERSONAL FACTORS: Fitness, Past/current experiences, and Time since onset of injury/illness/exacerbation are also affecting patient's functional outcome.    GOALS: Goals reviewed with patient? Yes  SHORT TERM GOALS: Target date: 06/17/2023  Patient will be I with initial HEP in order to progress with therapy. Baseline: HEP provided at eval 06/23/2023: progressing Goal status: ONGOING  2.  Patient will report neck and shoulder pain </= 5/10 in order to reduce functional limitations Baseline: 8/10 pain 06/23/2023: 8/10 Goal status: ONGOING  LONG TERM GOALS: Target date: 07/15/2023  Patient will be I with final HEP to maintain progress from PT. Baseline: HEP provided at eval Goal status: INITIAL  2.  Patient will report >/= 52% status on FOTO to indicate improved functional ability. Baseline: 43% functional status Goal status: INITIAL  3.  Patient will demonstrate right cervical rotation >/= 55 deg to reduce neck tension and improve driving ability Baseline: 40 deg Goal status: INITIAL  4.  Patient will demonstrate right shoulder strength >/= 4+/5 MMT and grip strength >/= 40 lbs in order to improve ability to perform household tasks and hold objects Baseline: right shoulder strength grossly 4/5 MMT, grip strength 35 lbs Goal status: INITIAL  5. Patient will demonstrate DNF endurance >/= 15 seconds in order to improve postural control and reduce neck pain  Baseline: 3 seconds  Goal status: INITIAL   PLAN: PT FREQUENCY: 1x/week  PT DURATION: 8 weeks  PLANNED INTERVENTIONS: Therapeutic exercises, Therapeutic activity, Neuromuscular re-education, Balance training, Gait training, Patient/Family education, Self Care, Joint mobilization, Dry Needling, Cryotherapy, Moist heat, Taping, Manual therapy,  and Re-evaluation ([employer] so cannot bill e-stim or traction)  PLAN FOR NEXT SESSION: Review HEP and progress PRN, manual/TPDN for right cervical paraspinals, suboccipitals, upper trap region, progress postural strength and endurance, right shoulder and grip strengthening  Letitia Libra, PT, DPT, ATC 07/02/23 10:56 AM   PHYSICAL THERAPY DISCHARGE SUMMARY  Visits from Start of Care: 3  Current functional level related to goals / functional outcomes: See above   Remaining deficits: See above   Education / Equipment: HEP   Patient agrees to discharge. Patient goals were not met. Patient is being discharged due to not returning since the last visit.  Rosana Hoes, PT, DPT, LAT, ATC 02/16/24  3:10 PM Phone: [phone] Fax: [phone]

## 2023-07-12 ENCOUNTER — Ambulatory Visit: Admission: RE | Admit: 2023-07-12 | Payer: Medicaid Other | Source: Ambulatory Visit

## 2023-07-12 DIAGNOSIS — N611 Abscess of the breast and nipple: Secondary | ICD-10-CM

## 2023-07-12 DIAGNOSIS — Z09 Encounter for follow-up examination after completed treatment for conditions other than malignant neoplasm: Secondary | ICD-10-CM | POA: Diagnosis not present

## 2023-07-13 DIAGNOSIS — K429 Umbilical hernia without obstruction or gangrene: Secondary | ICD-10-CM | POA: Diagnosis not present

## 2023-07-13 DIAGNOSIS — K420 Umbilical hernia with obstruction, without gangrene: Secondary | ICD-10-CM | POA: Diagnosis not present

## 2023-08-09 DIAGNOSIS — K429 Umbilical hernia without obstruction or gangrene: Secondary | ICD-10-CM | POA: Diagnosis not present

## 2023-08-09 NOTE — Progress Notes (Signed)
 PROVIDER:  DONNICE DEWAYNE LIMA, MD  MRN: [id number] DOB: 1971-10-22 DATE OF ENCOUNTER: 08/09/2023 Interval History:   This is a 52 year old female who underwent laparoscopic repair of a hiatal hernia in 2011 with primary repair of an umbilical hernia.  In 2020, she presented with periumbilical pain, reflux, nausea, and bloating.  We evaluated her fundoplication and there were no issues.  EGD was unremarkable.  On 02/07/19, she underwent open mesh repair of a recurrent umbilical hernia.  She had a 1.5 cm defect repaired with a small Ventralex mesh.  She did well after that surgery.  However, later in 2020, she developed gallbladder symptoms and underwent laparoscopic cholecystectomy with IOC.  Pathology confirmed chronic cholecystitis.  Her digestive symptoms improved.   Last year, she was evaluated for worsening pain just to the left of her umbilicus.  I obtained a CT scan in March 2023 that had no mention of a recurrent umbilical hernia and the radiology report.  She did have significant uterine fibroids. Last year, she underwent embolization of her uterine artery for fibroids.  In May 2023, she underwent total vaginal hysterectomy and bilateral salpingectomy by Dr. Zina.  The patient presents today continued to complain of pain to the left of her umbilicus.  She has begun to notice a bulge in this area.  Unfortunately, the patient continues to smoke a pack a day.  Last week, she had an episode where she had several days of fairly frequent large bowel movements.  She denies any diarrhea.  She states that normally she is fairly regular with the bowel movements and does not need to take any stool softeners or laxatives.  The frequent bowel movements have resolved.  She had previously been seen by [facility].  She denies any hematochezia or melena.   I examined her several weeks ago and noticed a small recurrence to the left of her umbilicus.  We discussed smoking cessation or at least cutting back.  She has  cut her cigarette intake at least in half in the last few weeks.  Her bowel movements have improved and returned to normal.  The hernia continues to cause some discomfort but it does not seem to be any larger than last visit.  On 07/13/2023, she underwent laparoscopic repair of her recurrent umbilical hernia.  Just above the left superior edge of the mesh there was an additional fascial defect containing some omentum.  This defect measured 1.5 cm.  We completely reduce the hernia sac.  No other defects were noted.  A 4 x 6 inch piece of Ventralight mesh was used to cover this new defect as well as the old hernia mesh.  Physical Examination:   Physical Exam   Her abdomen is soft and nontender.  All the laparoscopic incisions are well-healed.  No sign of recurrent hernia.  No abdominal tenderness over the umbilicus.   Assessment and Plan:   Jasmin Stephenson is a 52 y.o. female who underwent upper scopic repair of recurrent umbilical hernia on 07/13/2023.  Diagnoses and all orders for this visit:  Recurrent umbilical hernia     Resume full activity in 2 weeks  Return if symptoms worsen or fail to improve.   The plan was discussed in detail with the patient today, who expressed understanding.  The patient has my contact information, and understands to call me with any additional questions or concerns in the interval.  I would be happy to see the patient back sooner if the need arises.  MATTHEW KAI TSUEI, MD

## 2024-02-20 ENCOUNTER — Emergency Department (HOSPITAL_COMMUNITY)

## 2024-02-20 ENCOUNTER — Other Ambulatory Visit: Payer: Self-pay

## 2024-02-20 ENCOUNTER — Observation Stay (HOSPITAL_COMMUNITY)
Admission: EM | Admit: 2024-02-20 | Discharge: 2024-02-21 | Disposition: A | Discharge status: Home / Self Care | Attending: Student in an Organized Health Care Education/Training Program | Admitting: Student in an Organized Health Care Education/Training Program

## 2024-02-20 ENCOUNTER — Encounter (HOSPITAL_COMMUNITY): Payer: Self-pay

## 2024-02-20 DIAGNOSIS — J4520 Mild intermittent asthma, uncomplicated: Secondary | ICD-10-CM | POA: Diagnosis not present

## 2024-02-20 DIAGNOSIS — K449 Diaphragmatic hernia without obstruction or gangrene: Secondary | ICD-10-CM | POA: Diagnosis not present

## 2024-02-20 DIAGNOSIS — R6520 Severe sepsis without septic shock: Secondary | ICD-10-CM | POA: Diagnosis not present

## 2024-02-20 DIAGNOSIS — A419 Sepsis, unspecified organism: Secondary | ICD-10-CM | POA: Diagnosis not present

## 2024-02-20 DIAGNOSIS — E876 Hypokalemia: Secondary | ICD-10-CM | POA: Diagnosis present

## 2024-02-20 DIAGNOSIS — J189 Pneumonia, unspecified organism: Principal | ICD-10-CM | POA: Diagnosis present

## 2024-02-20 DIAGNOSIS — R0902 Hypoxemia: Secondary | ICD-10-CM | POA: Diagnosis not present

## 2024-02-20 DIAGNOSIS — R109 Unspecified abdominal pain: Secondary | ICD-10-CM | POA: Diagnosis not present

## 2024-02-20 DIAGNOSIS — D72829 Elevated white blood cell count, unspecified: Secondary | ICD-10-CM | POA: Diagnosis present

## 2024-02-20 DIAGNOSIS — J9601 Acute respiratory failure with hypoxia: Secondary | ICD-10-CM | POA: Diagnosis present

## 2024-02-20 DIAGNOSIS — E785 Hyperlipidemia, unspecified: Secondary | ICD-10-CM | POA: Diagnosis not present

## 2024-02-20 DIAGNOSIS — R918 Other nonspecific abnormal finding of lung field: Secondary | ICD-10-CM | POA: Diagnosis not present

## 2024-02-20 DIAGNOSIS — R058 Other specified cough: Secondary | ICD-10-CM | POA: Diagnosis not present

## 2024-02-20 DIAGNOSIS — F17210 Nicotine dependence, cigarettes, uncomplicated: Secondary | ICD-10-CM | POA: Insufficient documentation

## 2024-02-20 DIAGNOSIS — J168 Pneumonia due to other specified infectious organisms: Secondary | ICD-10-CM | POA: Diagnosis not present

## 2024-02-20 DIAGNOSIS — Z79899 Other long term (current) drug therapy: Secondary | ICD-10-CM | POA: Insufficient documentation

## 2024-02-20 DIAGNOSIS — I10 Essential (primary) hypertension: Secondary | ICD-10-CM | POA: Diagnosis not present

## 2024-02-20 DIAGNOSIS — J45909 Unspecified asthma, uncomplicated: Secondary | ICD-10-CM | POA: Diagnosis not present

## 2024-02-20 DIAGNOSIS — R0602 Shortness of breath: Secondary | ICD-10-CM | POA: Diagnosis not present

## 2024-02-20 DIAGNOSIS — Z8679 Personal history of other diseases of the circulatory system: Secondary | ICD-10-CM | POA: Diagnosis not present

## 2024-02-20 DIAGNOSIS — Z7982 Long term (current) use of aspirin: Secondary | ICD-10-CM | POA: Insufficient documentation

## 2024-02-20 DIAGNOSIS — R059 Cough, unspecified: Secondary | ICD-10-CM | POA: Diagnosis present

## 2024-02-20 DIAGNOSIS — R6883 Chills (without fever): Secondary | ICD-10-CM | POA: Diagnosis not present

## 2024-02-20 DIAGNOSIS — K219 Gastro-esophageal reflux disease without esophagitis: Secondary | ICD-10-CM | POA: Diagnosis not present

## 2024-02-20 DIAGNOSIS — K5730 Diverticulosis of large intestine without perforation or abscess without bleeding: Secondary | ICD-10-CM | POA: Diagnosis not present

## 2024-02-20 LAB — COMPREHENSIVE METABOLIC PANEL
ALT: 14 U/L (ref 0–44)
AST: 13 U/L — ABNORMAL LOW (ref 15–41)
Albumin: 3.8 g/dL (ref 3.5–5.0)
Alkaline Phosphatase: 106 U/L (ref 38–126)
Anion gap: 12 (ref 5–15)
BUN: <5 mg/dL — ABNORMAL LOW (ref 6–20)
CO2: 26 mmol/L (ref 22–32)
Calcium: 9.1 mg/dL (ref 8.9–10.3)
Chloride: 101 mmol/L (ref 98–111)
Creatinine, Ser: 0.93 mg/dL (ref 0.44–1.00)
GFR, Estimated: >60 mL/min (ref >60)
Glucose, Bld: 105 mg/dL — ABNORMAL HIGH (ref 70–99)
Potassium: 3.3 mmol/L — ABNORMAL LOW (ref 3.5–5.1)
Sodium: 139 mmol/L (ref 135–145)
Total Bilirubin: 0.8 mg/dL (ref 0.0–1.2)
Total Protein: 7.5 g/dL (ref 6.5–8.1)

## 2024-02-20 LAB — CBC WITH DIFFERENTIAL/PLATELET
Abs Immature Granulocytes: 0.11 10*3/uL — ABNORMAL HIGH (ref 0.00–0.07)
Basophils Absolute: 0.1 10*3/uL (ref 0.0–0.1)
Basophils Relative: 0 %
Eosinophils Absolute: 0.1 10*3/uL (ref 0.0–0.5)
Eosinophils Relative: 0 %
HCT: 46.7 % — ABNORMAL HIGH (ref 36.0–46.0)
Hemoglobin: 15.3 g/dL — ABNORMAL HIGH (ref 12.0–15.0)
Immature Granulocytes: 1 %
Lymphocytes Relative: 12 %
Lymphs Abs: 2.4 10*3/uL (ref 0.7–4.0)
MCH: 30.1 pg (ref 26.0–34.0)
MCHC: 32.8 g/dL (ref 30.0–36.0)
MCV: 91.9 fL (ref 80.0–100.0)
Monocytes Absolute: 2.8 10*3/uL — ABNORMAL HIGH (ref 0.1–1.0)
Monocytes Relative: 14 %
Neutro Abs: 15.1 10*3/uL — ABNORMAL HIGH (ref 1.7–7.7)
Neutrophils Relative %: 73 %
Platelets: 534 10*3/uL — ABNORMAL HIGH (ref 150–400)
RBC: 5.08 MIL/uL (ref 3.87–5.11)
RDW: 13.9 % (ref 11.5–15.5)
WBC: 20.6 10*3/uL — ABNORMAL HIGH (ref 4.0–10.5)
nRBC: 0 % (ref 0.0–0.2)

## 2024-02-20 LAB — EXPECTORATED SPUTUM ASSESSMENT W GRAM STAIN, RFLX TO RESP C

## 2024-02-20 LAB — RESP PANEL BY RT-PCR (RSV, FLU A&B, COVID)  RVPGX2
Influenza A by PCR: NEGATIVE
Influenza B by PCR: NEGATIVE
Resp Syncytial Virus by PCR: NEGATIVE
SARS Coronavirus 2 by RT PCR: NEGATIVE

## 2024-02-20 LAB — MRSA NEXT GEN BY PCR, NASAL: MRSA by PCR Next Gen: NOT DETECTED

## 2024-02-20 LAB — MAGNESIUM: Magnesium: 1.8 mg/dL (ref 1.7–2.4)

## 2024-02-20 LAB — PROCALCITONIN: Procalcitonin: <0.1 ng/mL

## 2024-02-20 LAB — TROPONIN I (HIGH SENSITIVITY)
Troponin I (High Sensitivity): 9 ng/L (ref <18)
Troponin I (High Sensitivity): 9 ng/L (ref <18)

## 2024-02-20 LAB — I-STAT CG4 LACTIC ACID, ED: Lactic Acid, Venous: 2.2 mmol/L (ref 0.5–1.9)

## 2024-02-20 MED ORDER — ASPIRIN 81 MG PO TBEC
81.0000 mg | DELAYED_RELEASE_TABLET | Freq: Every day | ORAL | Status: DC
  Administered 2024-02-21: 81 mg via ORAL
  Filled 2024-02-20: qty 1

## 2024-02-20 MED ORDER — ONDANSETRON 4 MG PO TBDP
4.0000 mg | ORAL_TABLET | Freq: Once | ORAL | Status: AC
  Administered 2024-02-20: 4 mg via ORAL
  Filled 2024-02-20: qty 1

## 2024-02-20 MED ORDER — LACTATED RINGERS IV BOLUS
1000.0000 mL | Freq: Once | INTRAVENOUS | Status: AC
  Administered 2024-02-20: 1000 mL via INTRAVENOUS

## 2024-02-20 MED ORDER — NAPROXEN 500 MG PO TABS
500.0000 mg | ORAL_TABLET | Freq: Once | ORAL | Status: AC
  Administered 2024-02-20: 500 mg via ORAL
  Filled 2024-02-20: qty 1

## 2024-02-20 MED ORDER — ACETAMINOPHEN 650 MG RE SUPP: 650.0000 mg | Freq: Four times a day (QID) | RECTAL | Status: DC | PRN

## 2024-02-20 MED ORDER — MELATONIN 3 MG PO TABS
3.0000 mg | ORAL_TABLET | Freq: Every evening | ORAL | Status: DC | PRN
  Administered 2024-02-20: 3 mg via ORAL
  Filled 2024-02-20: qty 1

## 2024-02-20 MED ORDER — AZITHROMYCIN 250 MG PO TABS
500.0000 mg | ORAL_TABLET | Freq: Once | ORAL | Status: AC
  Administered 2024-02-20: 500 mg via ORAL
  Filled 2024-02-20: qty 2

## 2024-02-20 MED ORDER — POTASSIUM CHLORIDE CRYS ER 20 MEQ PO TBCR
40.0000 meq | EXTENDED_RELEASE_TABLET | Freq: Once | ORAL | Status: AC
  Administered 2024-02-20: 40 meq via ORAL
  Filled 2024-02-20: qty 2

## 2024-02-20 MED ORDER — ACETAMINOPHEN 325 MG PO TABS
650.0000 mg | ORAL_TABLET | Freq: Four times a day (QID) | ORAL | Status: DC | PRN
  Administered 2024-02-21: 650 mg via ORAL
  Filled 2024-02-20: qty 2

## 2024-02-20 MED ORDER — BENZONATATE 100 MG PO CAPS
200.0000 mg | ORAL_CAPSULE | Freq: Three times a day (TID) | ORAL | Status: DC | PRN
  Administered 2024-02-20 – 2024-02-21 (×2): 200 mg via ORAL
  Filled 2024-02-20 (×2): qty 2

## 2024-02-20 MED ORDER — SODIUM CHLORIDE 0.9 % IV SOLN: 500.0000 mg | INTRAVENOUS | Status: DC

## 2024-02-20 MED ORDER — IOHEXOL 300 MG/ML  SOLN
100.0000 mL | Freq: Once | INTRAMUSCULAR | Status: AC | PRN
  Administered 2024-02-20: 100 mL via INTRAVENOUS

## 2024-02-20 MED ORDER — SODIUM CHLORIDE 0.9 % IV SOLN
1.0000 g | INTRAVENOUS | Status: DC
  Filled 2024-02-20: qty 10

## 2024-02-20 MED ORDER — LACTATED RINGERS IV SOLN
INTRAVENOUS | Status: AC
  Administered 2024-02-20: 75 mL/h via INTRAVENOUS

## 2024-02-20 MED ORDER — DM-GUAIFENESIN ER 30-600 MG PO TB12
1.0000 | ORAL_TABLET | Freq: Two times a day (BID) | ORAL | Status: DC
  Administered 2024-02-20 – 2024-02-21 (×3): 1 via ORAL
  Filled 2024-02-20 (×3): qty 1

## 2024-02-20 MED ORDER — SODIUM CHLORIDE 0.9 % IV SOLN
1.0000 g | Freq: Once | INTRAVENOUS | Status: AC
  Administered 2024-02-20: 1 g via INTRAVENOUS
  Filled 2024-02-20: qty 10

## 2024-02-20 MED ORDER — IRBESARTAN 75 MG PO TABS: 75.0000 mg | ORAL_TABLET | Freq: Every day | ORAL | Status: DC

## 2024-02-20 MED ORDER — ROSUVASTATIN CALCIUM 20 MG PO TABS: 10.0000 mg | ORAL_TABLET | Freq: Every day | ORAL | Status: DC

## 2024-02-20 MED ORDER — IPRATROPIUM BROMIDE 0.02 % IN SOLN
0.5000 mg | Freq: Four times a day (QID) | RESPIRATORY_TRACT | Status: DC
  Administered 2024-02-21 (×2): 0.5 mg via RESPIRATORY_TRACT
  Filled 2024-02-20 (×3): qty 2.5

## 2024-02-20 MED ORDER — LEVALBUTEROL HCL 1.25 MG/0.5ML IN NEBU: 1.2500 mg | INHALATION_SOLUTION | RESPIRATORY_TRACT | Status: DC | PRN

## 2024-02-20 MED ORDER — ONDANSETRON HCL 4 MG/2ML IJ SOLN: 4.0000 mg | Freq: Four times a day (QID) | INTRAMUSCULAR | Status: DC | PRN

## 2024-02-20 MED ORDER — ENSURE ENLIVE PO LIQD
237.0000 mL | Freq: Two times a day (BID) | ORAL | Status: DC
  Administered 2024-02-21: 237 mL via ORAL

## 2024-02-20 MED ORDER — HYDRALAZINE HCL 20 MG/ML IJ SOLN: 10.0000 mg | INTRAMUSCULAR | Status: DC | PRN

## 2024-02-20 MED ORDER — LEVALBUTEROL HCL 1.25 MG/0.5ML IN NEBU
1.2500 mg | INHALATION_SOLUTION | Freq: Four times a day (QID) | RESPIRATORY_TRACT | Status: DC
  Administered 2024-02-21: 1.25 mg via RESPIRATORY_TRACT
  Filled 2024-02-20 (×5): qty 0.5

## 2024-02-20 NOTE — ED Provider Notes (Signed)
 [place] EMERGENCY DEPARTMENT AT [facility] Provider Note   CSN: [id number] Arrival date & time: 02/20/24  1358     History  Chief Complaint  Patient presents with   Cough   Generalized Body Aches    Jasmin Stephenson is a 53 y.o. female with PMHx of HLD, PAD, tobacco abuse, GERD, asthma, HS presents to ED for evaluation of chills, fever, productive cough, myalgia, vomiting, diarrhea for past three weeks  Diarrhea has been occurring several times a day for three weeks. No blood in stool. She endorses fever of "probably over 105F" that was taken with temporal thermometer at home. She states she has been vomiting "a lot" each day. Was taking care of her grandson who had similar symptoms but he has since improved. She has taken Mucinex for symptoms without relief.  She also endorses substernal CP with mild SHOB that she describes as "pressure, dull, throbbing, sharp, and heavy". She does not wear O2 supplementation at home.  Initially, patient was not forthcoming with information due to wait time complaints and being in triage room.  Following being placed in a room, she notes she has been on an antiobtic for past week for a dental procedure but is unsure if diarrhea was prior to antibiotic course.   Cough Associated symptoms: no chest pain, no chills, no fever, no headaches, no shortness of breath and no wheezing       Home Medications Prior to Admission medications   Medication Sig Start Date End Date Taking? Authorizing Provider  albuterol (PROVENTIL) (2.5 MG/3ML) 0.083% nebulizer solution Take 3 mLs (2.5 mg total) by nebulization every 6 (six) hours as needed for wheezing or shortness of breath. 08/12/22   Claiborne Rigg, NP  albuterol (VENTOLIN HFA) 108 (90 Base) MCG/ACT inhaler Inhale 2 puffs into the lungs every 4 (four) hours as needed for wheezing or shortness of breath (cough, shortness of breath or wheezing.). 08/12/22   Claiborne Rigg, NP  aspirin EC 81 MG  tablet Take 1 tablet (81 mg total) by mouth daily. 07/15/20   Nada Libman, MD  benzoyl peroxide 5 % external liquid Apply topically 2 (two) times daily. 11/16/22   Claiborne Rigg, NP  Blood Pressure Monitor DEVI Please provide patient with insurance approved blood pressure monitor ICD 10 I10.0 08/12/22   Claiborne Rigg, NP  ketoconazole (NIZORAL) 2 % shampoo Apply 1 application. topically 2 (two) times a week. 10/16/21   [provider]  rosuvastatin (CRESTOR) 10 MG tablet Take 1 tablet (10 mg total) by mouth daily. 05/31/23   Jodelle Gross, NP  triamcinolone cream (KENALOG) 0.1 % Apply 1 Application topically 2 (two) times daily. For eczema on elbows 11/16/22   Claiborne Rigg, NP  valsartan (DIOVAN) 80 MG tablet Take 1 tablet (80 mg total) by mouth daily. 05/31/23   Jodelle Gross, NP  Vitamin D, Ergocalciferol, (DRISDOL) 1.25 MG (50000 UNIT) CAPS capsule Take 1 capsule (50,000 Units total) by mouth every 7 (seven) days. 12/13/20   Kallie Locks, FNP      Allergies    Penicillins    Review of Systems   Review of Systems  Constitutional:  Negative for chills, fatigue and fever.  Respiratory:  Positive for cough. Negative for chest tightness, shortness of breath and wheezing.   Cardiovascular:  Negative for chest pain and palpitations.  Gastrointestinal:  Positive for diarrhea, nausea and vomiting. Negative for abdominal pain and constipation.  Neurological:  Negative  for dizziness, seizures, weakness, light-headedness, numbness and headaches.    Physical Exam Updated Vital Signs BP (!) 161/109   Pulse 98   Temp 98.5 F (36.9 C) (Oral)   Resp 18   Ht 5\' 5"  (1.651 m)   Wt 90.7 kg   LMP 03/27/2022 Comment: DOS UPREG NEGATIVE  SpO2 91%   BMI 33.27 kg/m  Physical Exam Vitals and nursing note reviewed.  Constitutional:      General: She is not in acute distress.    Appearance: Normal appearance. She is not ill-appearing.  HENT:     Head: Normocephalic and  atraumatic.     Right Ear: Tympanic membrane, ear canal and external ear normal.     Left Ear: Tympanic membrane, ear canal and external ear normal.     Nose: Congestion present.     Mouth/Throat:     Mouth: Mucous membranes are moist.     Pharynx: No oropharyngeal exudate or posterior oropharyngeal erythema.  Eyes:     Conjunctiva/sclera: Conjunctivae normal.  Neck:     Comments: No meningismus Cardiovascular:     Rate and Rhythm: Normal rate.  Pulmonary:     Effort: Pulmonary effort is normal. No respiratory distress.     Breath sounds: Normal breath sounds.     Comments: Speaking in full and complete sentences. No tachypnea but will desat to 87-88% with talking and ambulation. Currently on 2L O2 Abdominal:     General: Bowel sounds are normal. There is no distension.     Palpations: Abdomen is soft.     Tenderness: There is generalized abdominal tenderness (with light palpation). There is no right CVA tenderness, left CVA tenderness, guarding or rebound.     Comments: Negative heel tap. N peritoneal signs  Musculoskeletal:     Cervical back: Normal range of motion and neck supple. No rigidity or tenderness.  Skin:    General: Skin is warm.     Coloration: Skin is not jaundiced or pale.  Neurological:     Mental Status: She is alert and oriented to person, place, and time. Mental status is at baseline.     ED Results / Procedures / Treatments   Labs (all labs ordered are listed, but only abnormal results are displayed) Labs Reviewed  CBC WITH DIFFERENTIAL/PLATELET - Abnormal; Notable for the following components:      Result Value   WBC 20.6 (*)    Hemoglobin 15.3 (*)    HCT 46.7 (*)    Platelets 534 (*)    Neutro Abs 15.1 (*)    Monocytes Absolute 2.8 (*)    Abs Immature Granulocytes 0.11 (*)    All other components within normal limits  COMPREHENSIVE METABOLIC PANEL - Abnormal; Notable for the following components:   Potassium 3.3 (*)    Glucose, Bld 105 (*)     BUN <5 (*)    AST 13 (*)    All other components within normal limits  RESP PANEL BY RT-PCR (RSV, FLU A&B, COVID)  RVPGX2  GASTROINTESTINAL PANEL BY PCR, STOOL (REPLACES STOOL CULTURE)  CBC WITH DIFFERENTIAL/PLATELET  COMPREHENSIVE METABOLIC PANEL  MAGNESIUM  MAGNESIUM  PHOSPHORUS  TROPONIN I (HIGH SENSITIVITY)  TROPONIN I (HIGH SENSITIVITY)    EKG None  Radiology CT ABDOMEN PELVIS W CONTRAST Result Date: 02/20/2024 CLINICAL DATA:  Acute abdominal pain and chills. EXAM: CT ABDOMEN AND PELVIS WITH CONTRAST TECHNIQUE: Multidetector CT imaging of the abdomen and pelvis was performed using the standard protocol following bolus administration of  intravenous contrast. RADIATION DOSE REDUCTION: This exam was performed according to the departmental dose-optimization program which includes automated exposure control, adjustment of the mA and/or kV according to patient size and/or use of iterative reconstruction technique. CONTRAST:  OMNIPAQUE IOHEXOL 300 MG/ML  SOLN COMPARISON:  02/25/2022 FINDINGS: Lower Chest: Multiple small ill-defined areas of airspace opacity are seen in both lower lungs, consistent with infectious or inflammatory etiology. Hepatobiliary: No suspicious hepatic masses identified. Prior cholecystectomy. No evidence of biliary obstruction. Pancreas:  No mass or inflammatory changes. Spleen: Within normal limits in size and appearance. Adrenals/Urinary Tract: No suspicious masses identified. No evidence of ureteral calculi or hydronephrosis. Unremarkable unopacified urinary bladder. Stomach/Bowel: Small hiatal hernia is seen. No evidence of obstruction, inflammatory process or abnormal fluid collections. Mild sigmoid diverticulosis noted, without signs of diverticulitis. Vascular/Lymphatic: No pathologically enlarged lymph nodes. No acute vascular findings. Reproductive: Prior hysterectomy noted. Adnexal regions are unremarkable in appearance. Other:  None. Musculoskeletal:  No  suspicious bone lesions identified. IMPRESSION: No acute findings within the abdomen or pelvis. Multiple small ill-defined areas of airspace opacity in both lower lungs, consistent with infectious or inflammatory etiology. Small hiatal hernia. Mild sigmoid diverticulosis, without radiographic evidence of diverticulitis. Electronically Signed   By: Danae Orleans M.D.   On: 02/20/2024 17:50   DG Chest 2 View Result Date: 02/20/2024 CLINICAL DATA:  Productive cough. EXAM: CHEST - 2 VIEW COMPARISON:  June 13, 2016. FINDINGS: The heart size and mediastinal contours are within normal limits. Left lung is clear. Minimal right perihilar interstitial opacity is noted concerning for asymmetric edema or possibly atypical inflammation. The visualized skeletal structures are unremarkable. IMPRESSION: Minimal right perihilar interstitial opacity is noted concerning for asymmetric edema or possibly atypical inflammation. Electronically Signed   By: Lupita Raider M.D.   On: 02/20/2024 16:05    Procedures Procedures    Medications Ordered in ED Medications  dextromethorphan-guaiFENesin (MUCINEX DM) 30-600 MG per 12 hr tablet 1 tablet (1 tablet Oral Given 02/20/24 1612)  cefTRIAXone (ROCEPHIN) 1 g in sodium chloride 0.9 % 100 mL IVPB (1 g Intravenous New Bag/Given 02/20/24 1918)  acetaminophen (TYLENOL) tablet 650 mg (has no administration in time range)    Or  acetaminophen (TYLENOL) suppository 650 mg (has no administration in time range)  melatonin tablet 3 mg (has no administration in time range)  ondansetron (ZOFRAN) injection 4 mg (has no administration in time range)  potassium chloride SA (KLOR-CON M) CR tablet 40 mEq (has no administration in time range)  cefTRIAXone (ROCEPHIN) 1 g in sodium chloride 0.9 % 100 mL IVPB (has no administration in time range)  azithromycin (ZITHROMAX) 500 mg in sodium chloride 0.9 % 250 mL IVPB (has no administration in time range)  levalbuterol (XOPENEX) nebulizer solution 1.25  mg (has no administration in time range)  naproxen (NAPROSYN) tablet 500 mg (500 mg Oral Given 02/20/24 1611)  ondansetron (ZOFRAN-ODT) disintegrating tablet 4 mg (4 mg Oral Given 02/20/24 1612)  lactated ringers bolus 1,000 mL (1,000 mLs Intravenous New Bag/Given 02/20/24 1713)  iohexol (OMNIPAQUE) 300 MG/ML solution 100 mL (100 mLs Intravenous Contrast Given 02/20/24 1724)  azithromycin (ZITHROMAX) tablet 500 mg (500 mg Oral Given 02/20/24 1918)    ED Course/ Medical Decision Making/ A&P                                 Medical Decision Making Amount and/or Complexity of Data Reviewed Labs: ordered. Radiology:  ordered.  Risk OTC drugs. Prescription drug management. Decision regarding hospitalization.   Patient presents to the ED for concern of multiple complaints noted in HPI, this involves an extensive number of treatment options, and is a complaint that carries with it a high risk of complications and morbidity.  The differential diagnosis includes COVID, flu, RSV, pneumonia, pulmonary edema, gastroenteritis, intra-abdominal pathology, infectious diarrhea.  Not an exhaustive list   Co morbidities that complicate the patient evaluation  Asthma   Additional history obtained:  Additional history obtained from Nursing and Outside Medical Records   External records from outside source obtained and reviewed including triage RN note   Lab Tests:  I Ordered, and personally interpreted labs.  The pertinent results include:   Leukocytosis of 20.6 Hemoglobin 15.3 Troponin 1 negative Potassium 3.3 Respiratory panel negative   Imaging Studies ordered:  I ordered imaging studies including chest x-ray, CT abdomen pelvis with contrast I independently visualized and interpreted imaging which showed question of bilateral pneumonia on CT increased opacity on x-ray I agree with the radiologist interpretation   Cardiac Monitoring:  The patient was maintained on a cardiac monitor.  I  personally viewed and interpreted the cardiac monitored which showed an underlying rhythm of: NSR   Medicines ordered and prescription drug management:  I ordered medication including zofran, LR, rocephin, Zithromax  for nausea, dehydration, pneumonia Reevaluation of the patient after these medicines showed that the patient improved I have reviewed the patients home medicines and have made adjustments as needed   Consultations obtained:  I consulted hospitalist Dr. Newton Pigg and discussed ED workup, disposition.  He accepts patient for admission   Problem List / ED Course:  Nausea, vomiting, diarrhea CT not notable for any acute abdominal pathology Could be related to recent antibiotic usage versus viral gastroenteritis.  Difficult to say as there is no clear timeline Provided 1 L LR for rehydration CP PNA Hypoxia CXR notable for question of PNA, inflammation Trop neg x1. EKG NSR with no STE nor ischemia Lung sounds CTAB. Low suspicion for asthma exacerbation Likely 2/2 cough, PNA The patient on ceftriaxone and Zithromax for pneumonia Will admit patient as she needs 2L O2 oxygen supplementation to maintain sats   Reevaluation:  After the interventions noted above, I reevaluated the patient and found that they have :improved   Dispostion:  After consideration of the diagnostic results and the patients response to treatment, I feel that the patent would benefit from admission for IV antibiotics and oxygen supplementation for pneumonia.    Final Clinical Impression(s) / ED Diagnoses Final diagnoses:  Pneumonia due to infectious organism, unspecified laterality, unspecified part of lung  Hypoxia    Rx / DC Orders ED Discharge Orders     None         Judithann Sheen, PA 02/20/24 1941    Melene Plan, DO 02/20/24 1954

## 2024-02-20 NOTE — ED Triage Notes (Signed)
 Productive cough, body aches, chills for 3 weeks. Pt states she took care of grandson and he was sick with the same

## 2024-02-20 NOTE — ED Notes (Signed)
 Pt placed on 3L [place], pt oxygen dropped to 87% when sleeping

## 2024-02-20 NOTE — H&P (Addendum)
 History and Physical      Jasmin Stephenson QIH:[id number] DOB: [birth year] DOA: 02/20/2024; DOS: 02/20/2024  PCP: Patient, No Pcp Per (will further assess) Patient coming from: home   I have personally briefly reviewed patient's old medical records in [facility]  Chief Complaint: sob  HPI: Jasmin Stephenson is a 53 y.o. female with medical history significant for essential hypertension, mild intermittent asthma, who is admitted to [facility] on 02/20/2024 with severe sepsis due to community-acquired pneumonia after presenting from home to [facility] ED complaining of shortness of breath.   The patient reports 1 week of progressive shortness of breath associated with new onset productive cough as well as subjective fever, with reported temperature max over the timeframe of 103, associated with chills, in the absence of full body rigors.  She is also noted some nausea in the absence of vomiting or hematemesis.  Denies any recent melena or hematochezia.  No recent dysuria or gross hematuria.  Initially, starting 2 to 3 weeks ago, she noted a new onset nonproductive cough associate with some generalized myalgias, with her grandson, whom she has been taking care of, sharing similar symptoms from which she has subsequently recovered.  She noted ensuing progression in her initial nonproductive cough and generalized myalgias to the above over the course of the last 1 week.  Denies any known baseline supplemental oxygen requirements.  Medical history is notable for mild intermittent asthma.  She denies any recent wheezing.  Her shortness of breath is not been associate any orthopnea, PND, or worsening of peripheral edema.  No hemoptysis.  Denies any associated chest pain, palpitations, diaphoresis.     ED Course:  Vital signs in the ED were notable for the following: Temperature max 99.1; heart rates in the 90s to 100; systolic pressures in the 130s to 160s; respiratory rate 16-18, initial oxygen  saturation 87% on room air, Sosan improving into the range of 96 to 99% on 2 L nasal cannula.  Labs were notable for the following: CMP notable for the following: Sodium 139, potassium 3.3, bicarb 23, creatinine 0.93 compared to 1.08 in December 2023, glucose 105.  Liver enzymes are within normal limits high sensitive troponin I 9.  CBC notable for Locasol count 20,600 with 73% neutrophils, hemoglobin 15.3.  COVID, influenza, RSV PCR were all negative.  Per my interpretation, EKG in ED demonstrated the following: Today's EKG, comparison to most recent prior EKG performed on 05/31/2023, shows sinus rhythm with heart rate 83, normal intervals, nonspecific T wave flattening in lead III as well as V6, of which T wave flattening in V6 appears unchanged from most recent prior EKG, also demonstrated nonspecific T wave inversion in V5, will demonstrate no evidence of ST changes, Cleen evidence of ST elevation.  Imaging in the ED, per corresponding formal radiology read, was notable for the following: 2 view chest x-ray shows right perihilar interstitial opacity concerning for atypical infection versus asymmetric edema, will demonstrate no evidence of overt pulmonary edema nor any evidence of effusion or pneumothorax.  CT abdomen/pelvis with contrast showed no evidence no evidence of acute intra-abdominal or acute intrapelvic process, but showed evidence of airspace opacities in the bilateral lower lungs consistent with infection, while demonstrating no evidence of pleural effusion or pulmonary edema.  While in the ED, the following were administered: Azithromycin, Rocephin, dextromethorphan 30 mg p.o. x 1 dose, guaifenesin 600 mg p.o. x 1 dose, naproxen 500 mg p.o. x 1 dose, Zofran 4 mg p.o. x  1, lactated Ringer's as well as a bolus.  Subsequently, the patient was admitted for further evaluation management of severe sepsis due to community-acquired pneumonia associated with multifocal pneumonia and complicated by  acute hypoxic respiratory distress, with additional labs notable for hypokalemia.    Review of Systems: As per HPI otherwise 10 point review of systems negative.   Past Medical History:  Diagnosis Date   Allergy    Anemia    1 unit   Anxiety    RA   Arthritis    and tendonitis   Asthma    Bipolar disorder (HCC)    Breast calcification, right    Cervical herniated disc 2017   Depression    Dysmenorrhea 11/2020   Family history of adverse reaction to anesthesia    GERD (gastroesophageal reflux disease)    History of blood transfusion    Hyperlipidemia    Menorrhagia 11/2020   Myocardial infarction [facility]) yrs ago   " Mild" no cardiologist   Neck fracture (HCC)    cervical neck fracture   Peripheral vascular disease (HCC)    Vitamin D deficiency     Past Surgical History:  Procedure Laterality Date   ABDOMINAL SURGERY     APPENDECTOMY  yrs ago   BLADDER SURGERY     BREAST EXCISIONAL BIOPSY Right    CERVICAL DISC ARTHROPLASTY N/A 09/21/2016   Procedure: CERVICAL SIX- CERVICAL SEVEN ARTHROPLASTY;  Surgeon: Loura Halt Ditty, MD;  Location: [facility] OR;  Service: Neurosurgery;  Laterality: N/A;  C6-7 Arthroplasty   CHOLECYSTECTOMY N/A 10/10/2019   Procedure: LAPAROSCOPIC CHOLECYSTECTOMY WITH INTRAOPERATIVE CHOLANGIOGRAM;  Surgeon: Manus Rudd, MD;  Location: [facility] OR;  Service: General;  Laterality: N/A;   FOOT SURGERY Left    corrections on foot   IR ANGIOGRAM SELECTIVE EACH ADDITIONAL VESSEL  05/26/2021   IR ANGIOGRAM VISCERAL SELECTIVE  05/26/2021   IR EMBO TUMOR ORGAN ISCHEMIA INFARCT INC GUIDE ROADMAPPING  05/26/2021   IR RADIOLOGIST EVAL & MGMT  03/19/2021   IR RADIOLOGIST EVAL & MGMT  06/10/2021   IR RADIOLOGIST EVAL & MGMT  01/05/2022   IR RENAL BILAT S&I MOD SED  05/26/2021   IR US GUIDE VASC ACCESS LEFT  05/26/2021   IRRIGATION AND DEBRIDEMENT ABSCESS Right 09/10/2014   Procedure: IRRIGATION AND DEBRIDEMENT RIGHT BREAST  ABSCESS;  Surgeon: Atilano Ina, MD;   Location: [facility] ORS;  Service: General;  Laterality: Right;   SYNDACTYLIZATION Right 01/08/2021   Procedure: SYNDACTYLIZATION;  Surgeon: Park Liter, DPM;  Location: [facility];  Service: Podiatry;  Laterality: Right;   TUBAL LIGATION     per pt    VAGINAL HYSTERECTOMY Bilateral 04/21/2022   Procedure: VAGINAL HYSTERECTOMY, BILATERAL SALPINGECTOMY;  Surgeon: Warden Fillers, MD;  Location: [facility] OR;  Service: Gynecology;  Laterality: Bilateral;   WISDOM TOOTH EXTRACTION      Social History:  reports that she has been smoking cigarettes. She has a 7.5 pack-year smoking history. She has never used smokeless tobacco. She reports current alcohol use. She reports that she does not currently use drugs after having used the following drugs: Marijuana.   Allergies  Allergen Reactions   Penicillins Nausea And Vomiting and Rash    Has patient had a PCN reaction causing immediate rash, facial/tongue/throat swelling, SOB or lightheadedness with hypotension: Yes Has patient had a PCN reaction causing severe rash involving mucus membranes or skin necrosis: Yes Has patient had a PCN reaction that required hospitalization No Has patient had a PCN  reaction occurring within the last 10 years: No If all of the above answers are "NO", then may proceed with Cephalosporin use.    Family History  Problem Relation Age of Onset   Diabetes Father    CAD Father    Breast cancer Sister 7   Alcohol abuse Neg Hx    Arthritis Neg Hx    Asthma Neg Hx    Birth defects Neg Hx    Cancer Neg Hx    COPD Neg Hx    Depression Neg Hx    Drug abuse Neg Hx    Early death Neg Hx    Hearing loss Neg Hx    Heart disease Neg Hx    Hyperlipidemia Neg Hx    Hypertension Neg Hx    Kidney disease Neg Hx    Learning disabilities Neg Hx    Mental illness Neg Hx    Mental retardation Neg Hx    Miscarriages / Stillbirths Neg Hx    Stroke Neg Hx    Vision loss Neg Hx     Family history reviewed and not  pertinent    Prior to Admission medications   Medication Sig Start Date End Date Taking? Authorizing Provider  albuterol (PROVENTIL) (2.5 MG/3ML) 0.083% nebulizer solution Take 3 mLs (2.5 mg total) by nebulization every 6 (six) hours as needed for wheezing or shortness of breath. 08/12/22   Claiborne Rigg, NP  albuterol (VENTOLIN HFA) 108 (90 Base) MCG/ACT inhaler Inhale 2 puffs into the lungs every 4 (four) hours as needed for wheezing or shortness of breath (cough, shortness of breath or wheezing.). 08/12/22   Claiborne Rigg, NP  aspirin EC 81 MG tablet Take 1 tablet (81 mg total) by mouth daily. 07/15/20   Nada Libman, MD  benzoyl peroxide 5 % external liquid Apply topically 2 (two) times daily. 11/16/22   Claiborne Rigg, NP  Blood Pressure Monitor DEVI Please provide patient with insurance approved blood pressure monitor ICD 10 I10.0 08/12/22   Claiborne Rigg, NP  ketoconazole (NIZORAL) 2 % shampoo Apply 1 application. topically 2 (two) times a week. 10/16/21   [provider]  rosuvastatin (CRESTOR) 10 MG tablet Take 1 tablet (10 mg total) by mouth daily. 05/31/23   Jodelle Gross, NP  triamcinolone cream (KENALOG) 0.1 % Apply 1 Application topically 2 (two) times daily. For eczema on elbows 11/16/22   Claiborne Rigg, NP  valsartan (DIOVAN) 80 MG tablet Take 1 tablet (80 mg total) by mouth daily. 05/31/23   Jodelle Gross, NP  Vitamin D, Ergocalciferol, (DRISDOL) 1.25 MG (50000 UNIT) CAPS capsule Take 1 capsule (50,000 Units total) by mouth every 7 (seven) days. 12/13/20   Kallie Locks, FNP     Objective    Physical Exam: Vitals:   02/20/24 1711 02/20/24 1851 02/20/24 1918 02/20/24 1922  BP: (!) 132/97 (!) 145/108 (!) 161/109   Pulse: (!) 101 100 98   Resp: 16 16 18    Temp: 99.1 F (37.3 C)   98.5 F (36.9 C)  TempSrc: Oral   Oral  SpO2: (!) 88% (!) 87% 91%   Weight:      Height:        General: appears to be stated age; alert, oriented; mildly  increased work of breathing noted. Skin: warm, dry, no rash Head:  AT/[place] Mouth:  Oral mucosa membranes appear moist, normal dentition Neck: supple; trachea midline Heart: Mildly tachycardic, but regular; did not appreciate any  M/R/G Lungs: CTAB, did not appreciate any wheezes, rales, or rhonchi Abdomen: + BS; soft, ND, NT Vascular: 2+ pedal pulses b/l; 2+ radial pulses b/l Extremities: no peripheral edema, no muscle wasting Neuro: strength and sensation intact in upper and lower extremities b/l    Labs on Admission: I have personally reviewed following labs and imaging studies  CBC: Recent Labs  Lab 02/20/24 1610  WBC 20.6*  NEUTROABS 15.1*  HGB 15.3*  HCT 46.7*  MCV 91.9  PLT 534*   Basic Metabolic Panel: Recent Labs  Lab 02/20/24 1610  NA 139  K 3.3*  CL 101  CO2 26  GLUCOSE 105*  BUN <5*  CREATININE 0.93  CALCIUM 9.1   GFR: Estimated Creatinine Clearance: 79.6 mL/min (by C-G formula based on SCr of 0.93 mg/dL). Liver Function Tests: Recent Labs  Lab 02/20/24 1610  AST 13*  ALT 14  ALKPHOS 106  BILITOT 0.8  PROT 7.5  ALBUMIN 3.8   No results for input(s): "LIPASE", "AMYLASE" in the last 168 hours. No results for input(s): "AMMONIA" in the last 168 hours. Coagulation Profile: No results for input(s): "INR", "PROTIME" in the last 168 hours. Cardiac Enzymes: No results for input(s): "CKTOTAL", "CKMB", "CKMBINDEX", "TROPONINI" in the last 168 hours. BNP (last 3 results) No results for input(s): "PROBNP" in the last 8760 hours. HbA1C: No results for input(s): "HGBA1C" in the last 72 hours. CBG: No results for input(s): "GLUCAP" in the last 168 hours. Lipid Profile: No results for input(s): "CHOL", "HDL", "LDLCALC", "TRIG", "CHOLHDL", "LDLDIRECT" in the last 72 hours. Thyroid Function Tests: No results for input(s): "TSH", "T4TOTAL", "FREET4", "T3FREE", "THYROIDAB" in the last 72 hours. Anemia Panel: No results for input(s): "VITAMINB12", "FOLATE",  "FERRITIN", "TIBC", "IRON", "RETICCTPCT" in the last 72 hours. Urine analysis:    Component Value Date/Time   COLORURINE YELLOW 10/19/2016 0934   APPEARANCEUR CLEAR 10/19/2016 0934   LABSPEC 1.020 02/15/2018 1021   PHURINE 7.0 02/15/2018 1021   GLUCOSEU NEGATIVE 02/15/2018 1021   HGBUR NEGATIVE 02/15/2018 1021   BILIRUBINUR negative 07/03/2019 1028   KETONESUR negative 07/03/2019 1028   KETONESUR NEGATIVE 02/15/2018 1021   PROTEINUR negative 07/03/2019 1028   PROTEINUR NEGATIVE 02/15/2018 1021   UROBILINOGEN 0.2 07/03/2019 1028   UROBILINOGEN 0.2 02/15/2018 1021   NITRITE Negative 07/03/2019 1028   NITRITE NEGATIVE 02/15/2018 1021   LEUKOCYTESUR Negative 07/03/2019 1028    Radiological Exams on Admission: CT ABDOMEN PELVIS W CONTRAST Result Date: 02/20/2024 CLINICAL DATA:  Acute abdominal pain and chills. EXAM: CT ABDOMEN AND PELVIS WITH CONTRAST TECHNIQUE: Multidetector CT imaging of the abdomen and pelvis was performed using the standard protocol following bolus administration of intravenous contrast. RADIATION DOSE REDUCTION: This exam was performed according to the departmental dose-optimization program which includes automated exposure control, adjustment of the mA and/or kV according to patient size and/or use of iterative reconstruction technique. CONTRAST:  OMNIPAQUE IOHEXOL 300 MG/ML  SOLN COMPARISON:  02/25/2022 FINDINGS: Lower Chest: Multiple small ill-defined areas of airspace opacity are seen in both lower lungs, consistent with infectious or inflammatory etiology. Hepatobiliary: No suspicious hepatic masses identified. Prior cholecystectomy. No evidence of biliary obstruction. Pancreas:  No mass or inflammatory changes. Spleen: Within normal limits in size and appearance. Adrenals/Urinary Tract: No suspicious masses identified. No evidence of ureteral calculi or hydronephrosis. Unremarkable unopacified urinary bladder. Stomach/Bowel: Small hiatal hernia is seen. No evidence  of obstruction, inflammatory process or abnormal fluid collections. Mild sigmoid diverticulosis noted, without signs of diverticulitis. Vascular/Lymphatic: No pathologically enlarged  lymph nodes. No acute vascular findings. Reproductive: Prior hysterectomy noted. Adnexal regions are unremarkable in appearance. Other:  None. Musculoskeletal:  No suspicious bone lesions identified. IMPRESSION: No acute findings within the abdomen or pelvis. Multiple small ill-defined areas of airspace opacity in both lower lungs, consistent with infectious or inflammatory etiology. Small hiatal hernia. Mild sigmoid diverticulosis, without radiographic evidence of diverticulitis. Electronically Signed   By: Danae Orleans M.D.   On: 02/20/2024 17:50   DG Chest 2 View Result Date: 02/20/2024 CLINICAL DATA:  Productive cough. EXAM: CHEST - 2 VIEW COMPARISON:  June 13, 2016. FINDINGS: The heart size and mediastinal contours are within normal limits. Left lung is clear. Minimal right perihilar interstitial opacity is noted concerning for asymmetric edema or possibly atypical inflammation. The visualized skeletal structures are unremarkable. IMPRESSION: Minimal right perihilar interstitial opacity is noted concerning for asymmetric edema or possibly atypical inflammation. Electronically Signed   By: Lupita Raider M.D.   On: 02/20/2024 16:05      Assessment/Plan    Principal Problem:   CAP (community acquired pneumonia) Active Problems:   Severe sepsis (HCC)   SOB (shortness of breath)   Leukocytosis   Acute hypoxic respiratory failure (HCC)   Hypokalemia   History of essential hypertension   Mild intermittent asthma without complication    #) Severe sepsis due to community-acquired pneumonia: Diagnosis on the basis of 1 week of progressive shortness of breath associated with productive cough, reported objective fever at home associated with chills, associated with acute hypoxic respiratory distress, as further  quantified below, while CT imaging shows evidence of airspace opacities in the bilateral lower lungs consistent with infection as well as chest x-ray showing evidence of an additional right perihilar interstitial opacity suggestive of atypical infection.  Overall, these radiographic findings   appear to suggest multifocal pneumonia.  Given her intermittent GI symptoms, as well as multifocal appearance of her pneumonia, will also check Legionella urine antigen.  An additional consideration is that the above symptoms were preceded by nonproductive cough associated with generalized myalgias after taking care of her grandson with similar symptoms.  These details, along with ensuing evolution in her symptoms and radiographic findings, raise suspicion for an initial viral respiratory infection that is subsequently resolved but has given way to get a secondary bacterial infection.  In this context, will also check MRSA PCR.  SIRS criteria met via leukocytosis with white blood cell count of 20,600 associated neutrophilic predominance, tachycardia. Lactic acid level: is currently pending. Of note, given the associated presence of suspected end organ damage in the form of concominant presenting acute hypoxic respiratory distress, criteria are met for pt's sepsis to be considered severe in nature. However, in the absence of lactic acid level that is greater than or equal to 4.0, and in the absence of any associated hypotension refractory to IVF's, there are no indications for administration of a 30 mL/kg IVF bolus at this time.   Additional ED work-up/management notable for: Started on azithromycin and Rocephin for community-acquired pneumonia, which will be continued.  Of note, blood cultures were not collected prior to initiation of these IV antibiotics.  No e/o additional infectious process at this time, including COVID, influenza, RSV PCR were all negative.  Will also check urinalysis.  Plan: CBC w/ diff and CMP  in AM.  Check blood cultures x 2.  Check sputum culture.  Abx: Continue azithromycin and Rocephin, as above.  Check stat lactic acid level every 3 hours x 2 occurrences.  Add on procalcitonin level.  Check urinalysis.  Continue the scheduled dextromethorphan and guaifenesin that were started in the ED.  Prn Tessalon Perles for breakthrough cough.  Prn Xopenex inhaler.  Flutter valve, incentive spirometry.  Check strep pneumonia urine antigen, Legionella urine antigen as well as mycoplasma antibodies.  Check MRSA PCR.  Prn acetaminophen for fever.  of note, the sepsis order set was utilized in placement of admission orders for this patient.                       #) Acute hypoxic respiratory distress: in the context of acute respiratory symptoms and no known baseline supplemental O2 requirements, presenting O2 sat note to be 87% on room air, Sosan improving into the mid to high 90s on 2 L nasal cannula, thereby meeting criteria for acute hypoxic respiratory distress as opposed to acute hypoxic respiratory failure at this time. Appears to be on basis of community-acquired pneumonia, with today's imaging suggestive of multifocal pneumonia, as further outlined above.   She has a history of mild intermittent asthma, although the presentation does not appear to be associated with an overt exacerbation thereof at this time, although she is at ensuing risk for development of associated exacerbation in the setting of her multifocal pneumonia.  No evidence of bronchospasm at this time.  In light of this, will initiate scheduled/as needed breathing treatments, but will refrain from initiation of systemic corticosteroids at this time.  Given her tachycardia, will proceed with Xopenex as preferred beta-2 agonist for now.  Presenting chest x-ray raise the possibility of potential asymmetric interstitial edema, although clinically, presentation appears less suggestive of acutely decompensated heart  failure.  Will add on BNP to further evaluate.  In terms of other considered etiologies, ACS appears less likely at this time in the absence of any recent CP and in the context of negative troponin and presenting EKG showing no e/o acute ischemic process. Clinically, presentation is less suggestive of acute PE at this time.   COVID-19/Influenza/RSV PCR are negative.   Plan: further evaluation/management of presenting severe sepsis due to community-acquired pneumonia, as above.  monitor on telemetry. CMP/CBC in the AM. Check serum Mg and Phos levels. Check blood gas. Flutter valve, incentive spirometry.  Add on BNP. Scheduled Xopenex/ipratropium nebulizers every 6 hours.  Prn Xopenex inhaler.  Add on procalcitonin level.  Check VBG.                       #) Hypokalemia: presenting potassium level noted to be 3.3, likely as a consequence of her intermittent nausea resulting in relative decline in oral intake recently.    Plan: monitor on tele. KCl 40 meq p.o. x 1 dose now. Add-on serum mag level. CMP, mag level in the AM.                #) Mild intermittent asthma: documented history thereof.  Clinically, presentation does not appear to be associated with an overt exacerbation thereof at this time, although she is at ensuing risk for development of associated exacerbation in the setting of her multifocal pneumonia.  No evidence of bronchospasm at this time.  In light of this, will initiate scheduled/as needed breathing treatments, but will refrain from initiation of systemic corticosteroids at this time.  Given her tachycardia, will proceed with Xopenex as preferred beta-2 agonist for now.  Outpatient respiratory regimen consists of prn albuterol inhaler.  Plan: Check serum magnesium level.  Scheduled  Xopenex/ipratropium nebulizers, prn Xopenex nebulizer. check VBG.              #) Essential Hypertension: documented h/o such, which the patient reports is  managed via lifestyle modifications, noting that she was previously on valsartan. SBP's in the ED today: 130s to 160s mmHg.   Plan: Close monitoring of subsequent BP via routine VS. as needed IV hydralazine for systolic blood pressure greater than 170 mmHg.         DVT prophylaxis: SCD's   Code Status: Full code Family Communication: none Disposition Plan: Per Rounding Team Consults called: none;  Admission status: Inpatient    I SPENT GREATER THAN 75  MINUTES IN CLINICAL CARE TIME/MEDICAL DECISION-MAKING IN COMPLETING THIS ADMISSION.     Chaney Born Waldo Damian DO [facility] From 7PM - 7AM   02/20/2024, 7:35 PM

## 2024-02-21 ENCOUNTER — Ambulatory Visit: Payer: Self-pay | Admitting: General Practice

## 2024-02-21 DIAGNOSIS — R0902 Hypoxemia: Secondary | ICD-10-CM | POA: Diagnosis not present

## 2024-02-21 DIAGNOSIS — J189 Pneumonia, unspecified organism: Secondary | ICD-10-CM | POA: Diagnosis not present

## 2024-02-21 LAB — CBC WITH DIFFERENTIAL/PLATELET
Abs Immature Granulocytes: 0.13 10*3/uL — ABNORMAL HIGH (ref 0.00–0.07)
Basophils Absolute: 0 10*3/uL (ref 0.0–0.1)
Basophils Relative: 0 %
Eosinophils Absolute: 0.2 10*3/uL (ref 0.0–0.5)
Eosinophils Relative: 1 %
HCT: 41.6 % (ref 36.0–46.0)
Hemoglobin: 13.6 g/dL (ref 12.0–15.0)
Immature Granulocytes: 1 %
Lymphocytes Relative: 12 %
Lymphs Abs: 2.5 10*3/uL (ref 0.7–4.0)
MCH: 30.6 pg (ref 26.0–34.0)
MCHC: 32.7 g/dL (ref 30.0–36.0)
MCV: 93.5 fL (ref 80.0–100.0)
Monocytes Absolute: 2.6 10*3/uL — ABNORMAL HIGH (ref 0.1–1.0)
Monocytes Relative: 13 %
Neutro Abs: 15.1 10*3/uL — ABNORMAL HIGH (ref 1.7–7.7)
Neutrophils Relative %: 73 %
Platelets: 493 10*3/uL — ABNORMAL HIGH (ref 150–400)
RBC: 4.45 MIL/uL (ref 3.87–5.11)
RDW: 14.2 % (ref 11.5–15.5)
WBC: 20.5 10*3/uL — ABNORMAL HIGH (ref 4.0–10.5)
nRBC: 0 % (ref 0.0–0.2)

## 2024-02-21 LAB — MAGNESIUM: Magnesium: 2.1 mg/dL (ref 1.7–2.4)

## 2024-02-21 LAB — URINALYSIS, COMPLETE (UACMP) WITH MICROSCOPIC
Bilirubin Urine: NEGATIVE
Glucose, UA: NEGATIVE mg/dL
Hgb urine dipstick: NEGATIVE
Ketones, ur: NEGATIVE mg/dL
Leukocytes,Ua: NEGATIVE
Nitrite: NEGATIVE
Protein, ur: NEGATIVE mg/dL
Specific Gravity, Urine: 1.021 (ref 1.005–1.030)
pH: 6 (ref 5.0–8.0)

## 2024-02-21 LAB — STREP PNEUMONIAE URINARY ANTIGEN: Strep Pneumo Urinary Antigen: NEGATIVE

## 2024-02-21 LAB — BLOOD GAS, VENOUS
Acid-Base Excess: 5.4 mmol/L — ABNORMAL HIGH (ref 0.0–2.0)
Bicarbonate: 29.9 mmol/L — ABNORMAL HIGH (ref 20.0–28.0)
O2 Saturation: 93.9 %
Patient temperature: 37
pCO2, Ven: 42 mmHg — ABNORMAL LOW (ref 44–60)
pH, Ven: 7.46 — ABNORMAL HIGH (ref 7.25–7.43)
pO2, Ven: 62 mmHg — ABNORMAL HIGH (ref 32–45)

## 2024-02-21 LAB — PHOSPHORUS: Phosphorus: 3.5 mg/dL (ref 2.5–4.6)

## 2024-02-21 LAB — BRAIN NATRIURETIC PEPTIDE: B Natriuretic Peptide: 103.7 pg/mL — ABNORMAL HIGH (ref 0.0–100.0)

## 2024-02-21 LAB — COMPREHENSIVE METABOLIC PANEL
ALT: 11 U/L (ref 0–44)
AST: 12 U/L — ABNORMAL LOW (ref 15–41)
Albumin: 3.1 g/dL — ABNORMAL LOW (ref 3.5–5.0)
Alkaline Phosphatase: 96 U/L (ref 38–126)
Anion gap: 7 (ref 5–15)
BUN: 6 mg/dL (ref 6–20)
CO2: 26 mmol/L (ref 22–32)
Calcium: 8.8 mg/dL — ABNORMAL LOW (ref 8.9–10.3)
Chloride: 105 mmol/L (ref 98–111)
Creatinine, Ser: 0.83 mg/dL (ref 0.44–1.00)
GFR, Estimated: >60 mL/min (ref >60)
Glucose, Bld: 108 mg/dL — ABNORMAL HIGH (ref 70–99)
Potassium: 3.8 mmol/L (ref 3.5–5.1)
Sodium: 138 mmol/L (ref 135–145)
Total Bilirubin: 0.7 mg/dL (ref 0.0–1.2)
Total Protein: 6.3 g/dL — ABNORMAL LOW (ref 6.5–8.1)

## 2024-02-21 LAB — LACTIC ACID, PLASMA: Lactic Acid, Venous: 0.8 mmol/L (ref 0.5–1.9)

## 2024-02-21 MED ORDER — PSEUDOEPHEDRINE HCL 30 MG PO TABS: 30.0000 mg | ORAL_TABLET | Freq: Three times a day (TID) | ORAL | 0 refills | Status: AC | PRN

## 2024-02-21 MED ORDER — DOXYCYCLINE HYCLATE 100 MG PO TABS: 100.0000 mg | ORAL_TABLET | Freq: Two times a day (BID) | ORAL | 0 refills | Status: AC

## 2024-02-21 MED ORDER — BENZONATATE 200 MG PO CAPS: 200.0000 mg | ORAL_CAPSULE | Freq: Three times a day (TID) | ORAL | 0 refills | Status: DC | PRN

## 2024-02-21 MED ORDER — ACETAMINOPHEN 325 MG PO TABS: 650.0000 mg | ORAL_TABLET | Freq: Four times a day (QID) | ORAL | 0 refills | Status: AC | PRN

## 2024-02-21 MED ORDER — AZITHROMYCIN 500 MG PO TABS: 500.0000 mg | ORAL_TABLET | Freq: Every day | ORAL | 0 refills | Status: AC

## 2024-02-21 MED ORDER — AZITHROMYCIN 250 MG PO TABS: 500.0000 mg | ORAL_TABLET | Freq: Every day | ORAL | Status: DC

## 2024-02-21 MED ORDER — DM-GUAIFENESIN ER 30-600 MG PO TB12: 1.0000 | ORAL_TABLET | Freq: Two times a day (BID) | ORAL | 0 refills | Status: DC | PRN

## 2024-02-21 NOTE — Telephone Encounter (Signed)
 Chief Complaint: patient concern Symptoms: CP, cough Frequency: pt admitted to [facility] yesterday Disposition: [] ED /[] Urgent Care (no appt availability in office) / [x] Appointment(In office/virtual)/ []  [place] [facility]/ [] Home Care/ [] Refused Recommended Disposition /[] [place] [facility]/ []  Follow-up with PCP Additional Notes: Pt calls in today from [facility] Pt was admitted last night at 2200 after going to the ED. Pt admitted for bilateral pneumonia. Pt states a provider told her she could be discharged today but patient does not feel ready to go home. Pt states she does not want this hospitalization "to be a waste of time", and that if she is discharged toady, it is likely she will need to return to the hospital. Pt reports 7/20 CP and states she notified staff of her CP this AM. Pt also endorses cough. Pt called today to see what her PCP could do to keep her from being discharged before she feels she is ready. According to EPIC, pt is not established.  Pt had an office visit on 06/20/21 with the [facility]. Then, patient had an office visit with Jasmin Stephenson @ the [facility] on 11/16/2022 to establish care. That was pt's last office visit.  RN called the [facility] CAL to ensure that pt was truly past due for an office visit per their guidelines and is no longer a patient of Jasmin Stephenson. CAL looked up pt and confirmed she is no longer established at the office.  Per CAL's recommendation, pt will have to notify nursing staff that she feels she is not ready to be discharged and there is nothing the office can do.  Since pt is not established, RN went to find a PCP for the pt. While clicking through the decision tree, the decision tree stated that Jasmin Stephenson is in fact the pt's PCP. RN called the CAL again, CAL said that pt could not be seen by Jasmin Stephenson and that even though the decision tree would allow this RN to make an appt with Jasmin Denver, RN should not  do that. CAL advised RN to schedule a hospital follow-up visit with Jasmin Stephenson at [place] instead. RN scheduled pt for a 3/25 hospital follow-up visit with Jasmin Stephenson at [facility] and pt is agreeable to that plan. RN provided the pt that address and sent her a text to activate her MyChart. Pt states she has made contact with the charge nurse on the unit and her concerns about her discharge are being addressed.   Copied from CRM [id number]. Topic: Clinical - Red Word Triage >> Feb 21, 2024 11:03 AM Jasmin Stephenson wrote: Red Word that prompted transfer to Nurse Triage:  Chest pain/ pt in hospital, they are gonna send her home, she is very upset.  Thinks she has a dr at [facility] Reason for Disposition  Difficult caller responded to triager counseling  Answer Assessment - Initial Assessment Questions 1. SITUATION:  Document reason for call.     Pt currently hospitalized at [facility] for bilateral pneumonia. Went to the ED last night and was admitted at 2200 last night 3/9. Pt to be discharged today and feels she is not ready to go home. 2. BACKGROUND: Document any background information (e.g., prior calls, known psychiatric history)     Pt admitted at [facility] for pneumonia at this time. Pt reports CP to this RN which she states she has mentioned to the nurse and doctor this AM. Pt states she has been sick for  3 wks. Pt states she did not want the hospitalization to be a waste of time. Does not want to be discharged only to come back. "It made me feel like they was trying to get the room." Reports CP started this AM before eating breakfast. At this time CP is a 7-8/10 with a headache. Female doctor came into the room to tell patient she can be discharged today.  3. ASSESSMENT: Document your nursing assessment.     7-8/10 chest pain, cough. CP worse with coughing. Sputum is brown/green with red spots. Pt is A&O on the phone. 4. RESPONSE: Document what your response or recommendation  was.     Pt would like her PCP to become involved and prevent her from being discharged today. Pt had an office visit on 06/20/21 with the [facility]. Then, patient had an office visit with Jasmin Stephenson @ the [facility] on 11/16/2022 to establish care. That was pt's last office visit. Pt has no PCP at this time per Epic.   RN called the [facility] CAL to ensure that pt was truly past due for an office visit per their guidelines and is no longer a patient of Jasmin Stephenson. CAL looked up pt and confirmed she is no longer established at the office. [facility] is not accepting patients until the middle of this year per the CAL. Per CAL's recommendation, pt will have to notify nursing staff that she feels she is not ready to be discharged.  Since pt is not established, RN went to find a PCP for the pt. While clicking through the decision tree, decision tree stated that Jasmin Stephenson is in fact the pt's PCP. RN called the CAL again, CAL said that pt could not be seen by Jasmin Stephenson and that even though the decision tree would allow this RN to make an appt with Jasmin Denver, RN should not do that. CAL advised RN to scheduled a hospital follow-up visit with Jasmin Stephenson at [place] instead. RN scheduled pt for a 3/25 hospital follow-up visit with Jasmin Stephenson at [facility] and pt is agreeable to that plan. Pt states that in the meantime she will advocate for herself to not be discharged.  Protocols used: Difficult Call-A-AH

## 2024-02-21 NOTE — Plan of Care (Signed)
  Problem: Fluid Volume: Goal: Hemodynamic stability will improve Outcome: Adequate for Discharge   Problem: Clinical Measurements: Goal: Diagnostic test results will improve Outcome: Adequate for Discharge Goal: Signs and symptoms of infection will decrease Outcome: Adequate for Discharge   Problem: Respiratory: Goal: Ability to maintain adequate ventilation will improve Outcome: Adequate for Discharge   Problem: Health Behavior/Discharge Planning: Goal: Ability to manage health-related needs will improve Outcome: Adequate for Discharge   Problem: Clinical Measurements: Goal: Ability to maintain clinical measurements within normal limits will improve Outcome: Adequate for Discharge Goal: Respiratory complications will improve Outcome: Adequate for Discharge Goal: Cardiovascular complication will be avoided Outcome: Adequate for Discharge   Problem: Activity: Goal: Risk for activity intolerance will decrease Outcome: Adequate for Discharge   Problem: Nutrition: Goal: Adequate nutrition will be maintained Outcome: Adequate for Discharge   Problem: Coping: Goal: Level of anxiety will decrease Outcome: Adequate for Discharge   Problem: Elimination: Goal: Will not experience complications related to bowel motility Outcome: Adequate for Discharge Goal: Will not experience complications related to urinary retention Outcome: Adequate for Discharge   Problem: Pain Managment: Goal: General experience of comfort will improve and/or be controlled Outcome: Adequate for Discharge   Problem: Safety: Goal: Ability to remain free from injury will improve Outcome: Adequate for Discharge   Problem: Skin Integrity: Goal: Risk for impaired skin integrity will decrease Outcome: Adequate for Discharge

## 2024-02-21 NOTE — Progress Notes (Incomplete)
 PROGRESS NOTE  Jasmin Stephenson    DOB: 12-12-1971, 53 y.o.  WUJ:[id number]    Code Status: Full Code   DOA: 02/20/2024   LOS: 1   Brief hospital course  Jasmin Stephenson is a 53 y.o. female with medical history significant for essential hypertension, mild intermittent asthma, who is admitted to [facility] on 02/20/2024 with severe sepsis due to community-acquired pneumonia after presenting from home to [facility] ED complaining of shortness of breath.    The patient reports 1 week of progressive shortness of breath associated with new onset productive cough as well as subjective fever, with reported temperature max over the timeframe of 103, associated with chills, in the absence of full body rigors.  She is also noted some nausea in the absence of vomiting or hematemesis.  Denies any recent melena or hematochezia.  No recent dysuria or gross hematuria.  Initially, starting 2 to 3 weeks ago, she noted a new onset nonproductive cough associate with some generalized myalgias, with her grandson, whom she has been taking care of, sharing similar symptoms from which she has subsequently recovered.  She noted ensuing progression in her initial nonproductive cough and generalized myalgias to the above over the course of the last 1 week.   Denies any known baseline supplemental oxygen requirements.  Medical history is notable for mild intermittent asthma.  She denies any recent wheezing.   Her shortness of breath is not been associate any orthopnea, PND, or worsening of peripheral edema.  No hemoptysis.  Denies any associated chest pain, palpitations, diaphoresis.        ED Course:  Vital signs in the ED were notable for the following: Temperature max 99.1; heart rates in the 90s to 100; systolic pressures in the 130s to 160s; respiratory rate 16-18, initial oxygen saturation 87% on room air, Sosan improving into the range of 96 to 99% on 2 L nasal cannula.   Labs were notable for the following: CMP notable  for the following: Sodium 139, potassium 3.3, bicarb 23, creatinine 0.93 compared to 1.08 in December 2023, glucose 105.  Liver enzymes are within normal limits high sensitive troponin I 9.  CBC notable for Locasol count 20,600 with 73% neutrophils, hemoglobin 15.3.  COVID, influenza, RSV PCR were all negative.   Per my interpretation, EKG in ED demonstrated the following: Today's EKG, comparison to most recent prior EKG performed on 05/31/2023, shows sinus rhythm with heart rate 83, normal intervals, nonspecific T wave flattening in lead III as well as V6, of which T wave flattening in V6 appears unchanged from most recent prior EKG, also demonstrated nonspecific T wave inversion in V5, will demonstrate no evidence of ST changes, Cleen evidence of ST elevation.   Imaging in the ED, per corresponding formal radiology read, was notable for the following: 2 view chest x-ray shows right perihilar interstitial opacity concerning for atypical infection versus asymmetric edema, will demonstrate no evidence of overt pulmonary edema nor any evidence of effusion or pneumothorax.  CT abdomen/pelvis with contrast showed no evidence no evidence of acute intra-abdominal or acute intrapelvic process, but showed evidence of airspace opacities in the bilateral lower lungs consistent with infection, while demonstrating no evidence of pleural effusion or pulmonary edema.   While in the ED, the following were administered: Azithromycin, Rocephin, dextromethorphan 30 mg p.o. x 1 dose, guaifenesin 600 mg p.o. x 1 dose, naproxen 500 mg p.o. x 1 dose, Zofran 4 mg p.o. x 1, lactated Ringer's as well as  a bolus.   Subsequently, the patient was admitted for further evaluation management of severe sepsis due to community-acquired pneumonia associated with multifocal pneumonia and complicated by acute hypoxic respiratory distress, with additional labs notable for hypokalemia.  02/21/24 -***  Assessment & Plan  Principal Problem:    CAP (community acquired pneumonia) Active Problems:   Severe sepsis (HCC)   SOB (shortness of breath)   Leukocytosis   Acute hypoxic respiratory failure (HCC)   Hypokalemia   History of essential hypertension   Mild intermittent asthma without complication  Severe sepsis due to community-acquired pneumonia:   hypokalemia -   Acute hypoxic respiratory failure  mild intermittent asthma -   HTN -   *** -   Body mass index is 32.25 kg/m.  VTE ppx: SCDs Start: 02/20/24 1932   Diet:     Diet   Diet regular Room service appropriate? Yes; Fluid consistency: Thin   Consultants: ***  Subjective 02/21/24    Pt reports ***   Objective   Vitals:   02/21/24 0252 02/21/24 0500 02/21/24 0540 02/21/24 0758  BP: (!) 125/97  125/86   Pulse: 78  76   Resp: 20  20   Temp: 98 F (36.7 C)  97.9 F (36.6 C)   TempSrc: Oral  Oral   SpO2: 100%  100% 96%  Weight:  87.9 kg    Height:        Intake/Output Summary (Last 24 hours) at 02/21/2024 0810 Last data filed at 02/21/2024 0700 Gross per 24 hour  Intake 2574.66 ml  Output 500 ml  Net 2074.66 ml   Filed Weights   02/20/24 1410 02/21/24 0500  Weight: 90.7 kg 87.9 kg     Physical Exam: *** General: awake, alert, NAD HEENT: atraumatic, clear conjunctiva, anicteric sclera, MMM, hearing grossly normal Respiratory: normal respiratory effort. Cardiovascular: quick capillary refill, normal S1/S2, RRR, no JVD, murmurs Gastrointestinal: soft, NT, ND Nervous: A&O x3. no gross focal neurologic deficits, normal speech Extremities: moves all equally, no edema, normal tone Skin: dry, intact, normal temperature, normal color. No rashes, lesions or ulcers on exposed skin Psychiatry: normal mood, congruent affect  Labs   I have personally reviewed the following labs and imaging studies CBC    Component Value Date/Time   WBC 20.5 (H) 02/21/2024 0526   RBC 4.45 02/21/2024 0526   HGB 13.6 02/21/2024 0526   HGB 13.6 08/12/2022  1032   HCT 41.6 02/21/2024 0526   HCT 42.3 08/12/2022 1032   PLT 493 (H) 02/21/2024 0526   PLT 416 08/12/2022 1032   MCV 93.5 02/21/2024 0526   MCV 86 08/12/2022 1032   MCH 30.6 02/21/2024 0526   MCHC 32.7 02/21/2024 0526   RDW 14.2 02/21/2024 0526   RDW 17.1 (H) 08/12/2022 1032   LYMPHSABS 2.5 02/21/2024 0526   LYMPHSABS 2.2 08/12/2022 1032   MONOABS 2.6 (H) 02/21/2024 0526   EOSABS 0.2 02/21/2024 0526   EOSABS 0.2 08/12/2022 1032   BASOSABS 0.0 02/21/2024 0526   BASOSABS 0.0 08/12/2022 1032      Latest Ref Rng & Units 02/21/2024    5:26 AM 02/20/2024    4:10 PM 11/16/2022   10:47 AM  BMP  Glucose 70 - 99 mg/dL 409  811  85   BUN 6 - 20 mg/dL 6  <5  9   Creatinine 9.14 - 1.00 mg/dL 7.82  9.56  2.13   BUN/Creat Ratio 9 - 23   8   Sodium 135 - 145  mmol/L 138  139  143   Potassium 3.5 - 5.1 mmol/L 3.8  3.3  4.0   Chloride 98 - 111 mmol/L 105  101  103   CO2 22 - 32 mmol/L 26  26  25    Calcium 8.9 - 10.3 mg/dL 8.8  9.1  9.2     CT ABDOMEN PELVIS W CONTRAST Result Date: 02/20/2024 CLINICAL DATA:  Acute abdominal pain and chills. EXAM: CT ABDOMEN AND PELVIS WITH CONTRAST TECHNIQUE: Multidetector CT imaging of the abdomen and pelvis was performed using the standard protocol following bolus administration of intravenous contrast. RADIATION DOSE REDUCTION: This exam was performed according to the departmental dose-optimization program which includes automated exposure control, adjustment of the mA and/or kV according to patient size and/or use of iterative reconstruction technique. CONTRAST:  OMNIPAQUE IOHEXOL 300 MG/ML  SOLN COMPARISON:  02/25/2022 FINDINGS: Lower Chest: Multiple small ill-defined areas of airspace opacity are seen in both lower lungs, consistent with infectious or inflammatory etiology. Hepatobiliary: No suspicious hepatic masses identified. Prior cholecystectomy. No evidence of biliary obstruction. Pancreas:  No mass or inflammatory changes. Spleen: Within normal  limits in size and appearance. Adrenals/Urinary Tract: No suspicious masses identified. No evidence of ureteral calculi or hydronephrosis. Unremarkable unopacified urinary bladder. Stomach/Bowel: Small hiatal hernia is seen. No evidence of obstruction, inflammatory process or abnormal fluid collections. Mild sigmoid diverticulosis noted, without signs of diverticulitis. Vascular/Lymphatic: No pathologically enlarged lymph nodes. No acute vascular findings. Reproductive: Prior hysterectomy noted. Adnexal regions are unremarkable in appearance. Other:  None. Musculoskeletal:  No suspicious bone lesions identified. IMPRESSION: No acute findings within the abdomen or pelvis. Multiple small ill-defined areas of airspace opacity in both lower lungs, consistent with infectious or inflammatory etiology. Small hiatal hernia. Mild sigmoid diverticulosis, without radiographic evidence of diverticulitis. Electronically Signed   By: Danae Orleans M.D.   On: 02/20/2024 17:50   DG Chest 2 View Result Date: 02/20/2024 CLINICAL DATA:  Productive cough. EXAM: CHEST - 2 VIEW COMPARISON:  June 13, 2016. FINDINGS: The heart size and mediastinal contours are within normal limits. Left lung is clear. Minimal right perihilar interstitial opacity is noted concerning for asymmetric edema or possibly atypical inflammation. The visualized skeletal structures are unremarkable. IMPRESSION: Minimal right perihilar interstitial opacity is noted concerning for asymmetric edema or possibly atypical inflammation. Electronically Signed   By: Lupita Raider M.D.   On: 02/20/2024 16:05    Disposition Plan & Communication  Patient status: Inpatient  Admitted From: {From:23814} Planned disposition location: {PLAN; DISPOSITION:26386} Anticipated discharge date: *** pending ***  Family Communication: ***    Author: Leeroy Bock, DO [facility] 02/21/2024, 8:10 AM   Available by Epic secure chat 7AM-7PM. If 7PM-7AM, please  contact night-coverage.  [facility] contact information found on [email or web address].

## 2024-02-21 NOTE — Plan of Care (Signed)
   Problem: Fluid Volume: Goal: Hemodynamic stability will improve Outcome: Progressing   Problem: Clinical Measurements: Goal: Diagnostic test results will improve Outcome: Progressing Goal: Signs and symptoms of infection will decrease Outcome: Progressing   Problem: Respiratory: Goal: Ability to maintain adequate ventilation will improve Outcome: Progressing

## 2024-02-21 NOTE — Discharge Instructions (Addendum)
 Ms. Koeneman, I'm sorry that you are still feeling sick. Pneumonia is typically treated at home with antibiotics unless someone needs oxygen support. It's great news that you do not need oxygen. Your saturation level of oxygen was 98% while you were walking. I've prescribed you medication options to help with your symptoms but will take time to fully recover. Your antibiotics were sent to the pharmacy as well.  Please continue to take your antibiotics until they are complete.  Follow up with your clinic within 1-2 weeks to monitor your recovery

## 2024-02-21 NOTE — Discharge Summary (Signed)
 Physician Discharge Summary  Patient: Jasmin Stephenson JWJ:[id number] DOB: April 09, 1971   Code Status: Full Code Admit date: 02/20/2024 Discharge date: 02/21/2024 Disposition: Home, No home health services recommended PCP: Patient, No Pcp Per  Recommendations for Outpatient Follow-up:  Follow up with PCP within 1-2 weeks Regarding general hospital follow up and preventative care Recommend  ***  Discharge Diagnoses:  Principal Problem:   CAP (community acquired pneumonia) Active Problems:   Severe sepsis (HCC)   SOB (shortness of breath)   Leukocytosis   Acute hypoxic respiratory failure (HCC)   Hypokalemia   History of essential hypertension   Mild intermittent asthma without complication   Hypoxia   Pneumonia due to infectious organism  Brief Hospital Course Summary: Pt ***  All other chronic conditions were treated with home medications.    Discharge Condition: {DISCHARGE CONDITION:19696}, improved Recommended discharge diet: {Discharge AOZH:[id number]}  Consultations: ***  Procedures/Studies: ***  Discharge Instructions     Discharge patient   Complete by: As directed    Discharge disposition: 01-Home or Self Care   Discharge patient date: 02/21/2024      Allergies as of 02/21/2024       Reactions   Penicillins Nausea And Vomiting, Rash   Has patient had a PCN reaction causing immediate rash, facial/tongue/throat swelling, SOB or lightheadedness with hypotension: Yes Has patient had a PCN reaction causing severe rash involving mucus membranes or skin necrosis: Yes Has patient had a PCN reaction that required hospitalization No Has patient had a PCN reaction occurring within the last 10 years: No If all of the above answers are "NO", then may proceed with Cephalosporin use.        Medication List     STOP taking these medications    aspirin EC 81 MG tablet   Denta 5000 Plus 1.1 % Crea dental cream Generic drug: sodium fluoride   guaiFENesin 600 MG  12 hr tablet Commonly known as: MUCINEX   phenylephrine 10 MG Tabs tablet Commonly known as: SUDAFED PE   rosuvastatin 10 MG tablet Commonly known as: Crestor   valsartan 80 MG tablet Commonly known as: DIOVAN   Vitamin D (Ergocalciferol) 1.25 MG (50000 UNIT) Caps capsule Commonly known as: DRISDOL       TAKE these medications    acetaminophen 325 MG tablet Commonly known as: TYLENOL Take 2 tablets (650 mg total) by mouth every 6 (six) hours as needed for up to 15 days for mild pain (pain score 1-3) (or Fever >/= 101).   azithromycin 500 MG tablet Commonly known as: ZITHROMAX Take 1 tablet (500 mg total) by mouth daily for 4 days.   benzonatate 200 MG capsule Commonly known as: TESSALON Take 1 capsule (200 mg total) by mouth 3 (three) times daily as needed for cough.   CHOLECALCIFEROL PO Take 1 tablet by mouth daily. Strength unknown   dextromethorphan-guaiFENesin 30-600 MG 12hr tablet Commonly known as: MUCINEX DM Take 1 tablet by mouth 2 (two) times daily as needed for cough.   doxycycline 100 MG tablet Commonly known as: VIBRA-TABS Take 1 tablet (100 mg total) by mouth 2 (two) times daily for 4 days.   pseudoephedrine 30 MG tablet Commonly known as: SUDAFED Take 1 tablet (30 mg total) by mouth every 8 (eight) hours as needed for up to 15 days for congestion.        Follow-up Information     [facility]. Schedule an appointment as soon as possible for a  visit in 1 week.   Why: to establish care with primary care provider and [place] f/u Contact information: [address] [place] [address] [phone]                Subjective   Pt reports ***  All questions and concerns were addressed at time of discharge.  Objective  Blood pressure 125/86, pulse 76, temperature 97.9 F (36.6 C), temperature source Oral, resp. rate 20, height 5\' 5"  (1.651 m), weight 87.9 kg, last menstrual  period 03/27/2022, SpO2 97%.   General: Pt is alert, awake, not in acute distress Cardiovascular: RRR, S1/S2 +, no rubs, no gallops Respiratory: CTA bilaterally, no wheezing, no rhonchi Abdominal: Soft, NT, ND, bowel sounds + Extremities: no edema, no cyanosis  The results of significant diagnostics from this hospitalization (including imaging, microbiology, ancillary and laboratory) are listed below for reference.   Imaging studies: CT ABDOMEN PELVIS W CONTRAST Result Date: 02/20/2024 CLINICAL DATA:  Acute abdominal pain and chills. EXAM: CT ABDOMEN AND PELVIS WITH CONTRAST TECHNIQUE: Multidetector CT imaging of the abdomen and pelvis was performed using the standard protocol following bolus administration of intravenous contrast. RADIATION DOSE REDUCTION: This exam was performed according to the departmental dose-optimization program which includes automated exposure control, adjustment of the mA and/or kV according to patient size and/or use of iterative reconstruction technique. CONTRAST:  OMNIPAQUE IOHEXOL 300 MG/ML  SOLN COMPARISON:  02/25/2022 FINDINGS: Lower Chest: Multiple small ill-defined areas of airspace opacity are seen in both lower lungs, consistent with infectious or inflammatory etiology. Hepatobiliary: No suspicious hepatic masses identified. Prior cholecystectomy. No evidence of biliary obstruction. Pancreas:  No mass or inflammatory changes. Spleen: Within normal limits in size and appearance. Adrenals/Urinary Tract: No suspicious masses identified. No evidence of ureteral calculi or hydronephrosis. Unremarkable unopacified urinary bladder. Stomach/Bowel: Small hiatal hernia is seen. No evidence of obstruction, inflammatory process or abnormal fluid collections. Mild sigmoid diverticulosis noted, without signs of diverticulitis. Vascular/Lymphatic: No pathologically enlarged lymph nodes. No acute vascular findings. Reproductive: Prior hysterectomy noted. Adnexal regions are  unremarkable in appearance. Other:  None. Musculoskeletal:  No suspicious bone lesions identified. IMPRESSION: No acute findings within the abdomen or pelvis. Multiple small ill-defined areas of airspace opacity in both lower lungs, consistent with infectious or inflammatory etiology. Small hiatal hernia. Mild sigmoid diverticulosis, without radiographic evidence of diverticulitis. Electronically Signed   By: Danae Orleans M.D.   On: 02/20/2024 17:50   DG Chest 2 View Result Date: 02/20/2024 CLINICAL DATA:  Productive cough. EXAM: CHEST - 2 VIEW COMPARISON:  June 13, 2016. FINDINGS: The heart size and mediastinal contours are within normal limits. Left lung is clear. Minimal right perihilar interstitial opacity is noted concerning for asymmetric edema or possibly atypical inflammation. The visualized skeletal structures are unremarkable. IMPRESSION: Minimal right perihilar interstitial opacity is noted concerning for asymmetric edema or possibly atypical inflammation. Electronically Signed   By: Lupita Raider M.D.   On: 02/20/2024 16:05    Labs: Basic Metabolic Panel: Recent Labs  Lab 02/20/24 1610 02/20/24 2045 02/21/24 0526  NA 139  --  138  K 3.3*  --  3.8  CL 101  --  105  CO2 26  --  26  GLUCOSE 105*  --  108*  BUN <5*  --  6  CREATININE 0.93  --  0.83  CALCIUM 9.1  --  8.8*  MG  --  1.8 2.1  PHOS  --   --  3.5   CBC: Recent Labs  Lab 02/20/24 1610 02/21/24 0526  WBC 20.6* 20.5*  NEUTROABS 15.1* 15.1*  HGB 15.3* 13.6  HCT 46.7* 41.6  MCV 91.9 93.5  PLT 534* 493*   Microbiology: Results for orders placed or performed during the hospital encounter of 02/20/24  Resp panel by RT-PCR (RSV, Flu A&B, Covid) Anterior Nasal Swab     Status: None   Collection Time: 02/20/24  2:10 PM   Specimen: Anterior Nasal Swab  Result Value Ref Range Status   SARS Coronavirus 2 by RT PCR NEGATIVE NEGATIVE Final    Comment: (NOTE) SARS-CoV-2 target nucleic acids are NOT DETECTED.  The  SARS-CoV-2 RNA is generally detectable in upper respiratory specimens during the acute phase of infection. The lowest concentration of SARS-CoV-2 viral copies this assay can detect is 138 copies/mL. A negative result does not preclude SARS-Cov-2 infection and should not be used as the sole basis for treatment or other patient management decisions. A negative result may occur with  improper specimen collection/handling, submission of specimen other than nasopharyngeal swab, presence of viral mutation(s) within the areas targeted by this assay, and inadequate number of viral copies(<138 copies/mL). A negative result must be combined with clinical observations, patient history, and epidemiological information. The expected result is Negative.  Fact Sheet for Patients:  BloggerCourse.com  Fact Sheet for Healthcare Providers:  SeriousBroker.it  This test is no t yet approved or cleared by the [place] FDA and  has been authorized for detection and/or diagnosis of SARS-CoV-2 by FDA under an Emergency Use Authorization (EUA). This EUA will remain  in effect (meaning this test can be used) for the duration of the COVID-19 declaration under Section 564(b)(1) of the Act, 21 U.S.C.section 360bbb-3(b)(1), unless the authorization is terminated  or revoked sooner.       Influenza A by PCR NEGATIVE NEGATIVE Final   Influenza B by PCR NEGATIVE NEGATIVE Final    Comment: (NOTE) The Xpert Xpress SARS-CoV-2/FLU/RSV plus assay is intended as an aid in the diagnosis of influenza from Nasopharyngeal swab specimens and should not be used as a sole basis for treatment. Nasal washings and aspirates are unacceptable for Xpert Xpress SARS-CoV-2/FLU/RSV testing.  Fact Sheet for Patients: BloggerCourse.com  Fact Sheet for Healthcare Providers: SeriousBroker.it  This test is not yet approved or  cleared by the [place] FDA and has been authorized for detection and/or diagnosis of SARS-CoV-2 by FDA under an Emergency Use Authorization (EUA). This EUA will remain in effect (meaning this test can be used) for the duration of the COVID-19 declaration under Section 564(b)(1) of the Act, 21 U.S.C. section 360bbb-3(b)(1), unless the authorization is terminated or revoked.     Resp Syncytial Virus by PCR NEGATIVE NEGATIVE Final    Comment: (NOTE) Fact Sheet for Patients: BloggerCourse.com  Fact Sheet for Healthcare Providers: SeriousBroker.it  This test is not yet approved or cleared by the [place] FDA and has been authorized for detection and/or diagnosis of SARS-CoV-2 by FDA under an Emergency Use Authorization (EUA). This EUA will remain in effect (meaning this test can be used) for the duration of the COVID-19 declaration under Section 564(b)(1) of the Act, 21 U.S.C. section 360bbb-3(b)(1), unless the authorization is terminated or revoked.  Performed at [facility], [address] [place], [place] [address]   MRSA Next Gen by PCR, Nasal     Status: None   Collection Time: 02/20/24  8:00 PM   Specimen: Nasal Mucosa; Nasal Swab  Result Value Ref Range Status   MRSA by PCR Next Gen NOT DETECTED NOT DETECTED Final    Comment: (NOTE) The GeneXpert MRSA Assay (FDA approved for NASAL specimens only), is one component of a comprehensive MRSA colonization surveillance program. It is not intended to diagnose MRSA infection nor to guide or monitor treatment for MRSA infections. Test performance is not FDA approved in patients less than 66 years old. Performed at [facility], [address], [place], [place] [address]   Expectorated Sputum Assessment w Gram Stain, Rflx to Resp Cult     Status: None   Collection Time: 02/20/24  8:00 PM   Specimen: Sputum  Result Value Ref Range  Status   Specimen Description SPUTUM  Final   Special Requests NONE  Final   Sputum evaluation   Final    THIS SPECIMEN IS ACCEPTABLE FOR SPUTUM CULTURE Performed at [facility], [address], [place], [place] [address]    Report Status 02/20/2024 FINAL  Final  Culture, Respiratory w Gram Stain     Status: None (Preliminary result)   Collection Time: 02/20/24  8:00 PM   Specimen: SPU  Result Value Ref Range Status   Specimen Description   Final    SPUTUM Performed at [facility], [address], [place], [place] [address]    Special Requests   Final    NONE Reflexed from [id number] Performed at [facility], [address], [place], [place] [address]    Gram Stain   Final    FEW WBC PRESENT, PREDOMINANTLY PMN MODERATE GRAM POSITIVE COCCI FEW GRAM NEGATIVE RODS Performed at [facility], [address], [place], [place] [address]    Culture PENDING  Incomplete   Report Status PENDING  Incomplete    Time coordinating discharge: Over 30 minutes  Leeroy Bock, MD  [facility] 02/21/2024, 2:22 PM

## 2024-02-21 NOTE — Progress Notes (Signed)
 Patient removed her IV by herself. Jasmin Stephenson refused to use a wheelchair for discharge. Prescriptions called into her Pharmacy by the MD.

## 2024-02-21 NOTE — Progress Notes (Signed)
 Patient received discharge instructions and verbalized understanding.  No further questions or concerns.  Patient able to ambulate downstairs to personal vehicle for discharge.

## 2024-02-21 NOTE — Progress Notes (Signed)
   02/21/24 1109  TOC Brief Assessment  Insurance and Status Reviewed  Patient has primary care physician Yes  Home environment has been reviewed home alone  Prior level of function: independent  Prior/Current Home Services No current home services  Social Drivers of Health Review SDOH reviewed no interventions necessary  Readmission risk has been reviewed Yes  Transition of care needs no transition of care needs at this time

## 2024-02-22 LAB — CULTURE, RESPIRATORY W GRAM STAIN

## 2024-02-24 LAB — LEGIONELLA PNEUMOPHILA SEROGP 1 UR AG: L. pneumophila Serogp 1 Ur Ag: NEGATIVE

## 2024-02-29 ENCOUNTER — Telehealth (INDEPENDENT_AMBULATORY_CARE_PROVIDER_SITE_OTHER): Payer: Self-pay | Admitting: Primary Care

## 2024-02-29 NOTE — Telephone Encounter (Signed)
 Called pt about upcoming appt. Pt did not answer and VM was full. Could not leave a message.

## 2024-03-07 ENCOUNTER — Ambulatory Visit (INDEPENDENT_AMBULATORY_CARE_PROVIDER_SITE_OTHER): Admitting: Primary Care

## 2024-03-07 ENCOUNTER — Encounter (INDEPENDENT_AMBULATORY_CARE_PROVIDER_SITE_OTHER): Payer: Self-pay | Admitting: Primary Care

## 2024-03-07 VITALS — BP 146/92 | HR 101 | Wt 201.4 lb

## 2024-03-07 DIAGNOSIS — J9601 Acute respiratory failure with hypoxia: Secondary | ICD-10-CM

## 2024-03-07 DIAGNOSIS — I10 Essential (primary) hypertension: Secondary | ICD-10-CM | POA: Diagnosis not present

## 2024-03-07 DIAGNOSIS — R051 Acute cough: Secondary | ICD-10-CM

## 2024-03-07 MED ORDER — GUAIFENESIN 200 MG PO TABS: 400.0000 mg | ORAL_TABLET | Freq: Three times a day (TID) | ORAL | 0 refills | Status: DC | PRN

## 2024-03-07 MED ORDER — VALSARTAN-HYDROCHLOROTHIAZIDE 80-12.5 MG PO TABS: 1.0000 | ORAL_TABLET | Freq: Every day | ORAL | 3 refills | Status: DC

## 2024-03-07 MED ORDER — BENZONATATE 200 MG PO CAPS: 200.0000 mg | ORAL_CAPSULE | Freq: Three times a day (TID) | ORAL | 0 refills | Status: AC | PRN

## 2024-03-07 NOTE — Progress Notes (Signed)
 Subjective:  Jasmin Stephenson is a 53 y.o. female presents for hospital follow up .  Patient presented to the emergency room with progressive shortness of breath associated with new onset productive cough as well a temperature max over the timeframe of 103, associated with chills, in the absence of full body rigors Admit date to the hospital was 02/20/24, patient was discharged from the hospital on 02/21/24, patient was admitted for: sepsis due to community-acquired pneumonia associated with multifocal pneumonia and complicated by acute hypoxic respiratory distress, with additional labs notable for hypokalemia.  Today patient room air no exertion O2 was not 100%.  Walked briskly for 3 minutes 50 to 100 feet O2 sats dropped down to 82% on room air with shortness of breath.  Past Medical History:  Diagnosis Date   Allergy    Anemia    1 unit   Anxiety    RA   Arthritis    and tendonitis   Asthma    Bipolar disorder (HCC)    Breast calcification, right    Cervical herniated disc 2017   Depression    Dysmenorrhea 11/2020   Family history of adverse reaction to anesthesia    GERD (gastroesophageal reflux disease)    History of blood transfusion    Hyperlipidemia    Menorrhagia 11/2020   Myocardial infarction [facility]) yrs ago   " Mild" no cardiologist   Neck fracture (HCC)    cervical neck fracture   Peripheral vascular disease (HCC)    Vitamin D deficiency      Allergies  Allergen Reactions   Penicillins Nausea And Vomiting and Rash    Has patient had a PCN reaction causing immediate rash, facial/tongue/throat swelling, SOB or lightheadedness with hypotension: Yes Has patient had a PCN reaction causing severe rash involving mucus membranes or skin necrosis: Yes Has patient had a PCN reaction that required hospitalization No Has patient had a PCN reaction occurring within the last 10 years: No If all of the above answers are "NO", then may proceed with Cephalosporin use.    Current  Outpatient Medications on File Prior to Visit  Medication Sig Dispense Refill   CHOLECALCIFEROL PO Take 1 tablet by mouth daily. Strength unknown     No current facility-administered medications on file prior to visit.    Review of System: ROS Comprehensive ROS Pertinent positive and negative noted in HPI   Objective:  BP (!) 146/92 (Cuff Size: Normal)   Pulse (!) 101   Wt 201 lb 6.4 oz (91.4 kg)   LMP 03/27/2022 Comment: DOS UPREG NEGATIVE  SpO2 100%   BMI 33.51 kg/m   Filed Weights   03/07/24 1007  Weight: 201 lb 6.4 oz (91.4 kg)    Physical Exam Vitals reviewed.  Constitutional:      Appearance: Normal appearance. She is obese.  HENT:     Head: Normocephalic.     Right Ear: Tympanic membrane, ear canal and external ear normal.     Left Ear: Tympanic membrane, ear canal and external ear normal.     Nose: Nose normal.     Mouth/Throat:     Mouth: Mucous membranes are moist.  Eyes:     Extraocular Movements: Extraocular movements intact.     Pupils: Pupils are equal, round, and reactive to light.  Cardiovascular:     Rate and Rhythm: Normal rate.  Pulmonary:     Effort: Pulmonary effort is normal.     Breath sounds: Normal breath sounds.  Abdominal:  General: Bowel sounds are normal.     Palpations: Abdomen is soft.  Musculoskeletal:        General: Normal range of motion.     Cervical back: Normal range of motion.  Skin:    General: Skin is warm and dry.  Neurological:     Mental Status: She is alert and oriented to person, place, and time.  Psychiatric:        Mood and Affect: Mood normal.        Behavior: Behavior normal.        Thought Content: Thought content normal.      Assessment:  Dinara was seen today for hospitalization follow-up and cough.  Diagnoses and all orders for this visit:  Essential hypertension BP goal - < 130/80 Explained that having normal blood pressure is the goal and medications are helping to get to goal and maintain  normal blood pressure. DIET: Limit salt intake, read nutrition labels to check salt content, limit fried and high fatty foods  Avoid using multisymptom OTC cold preparations that generally contain sudafed which can rise BP. Consult with pharmacist on best cold relief products to use for persons with HTN EXERCISE Discussed incorporating exercise such as walking - 30 minutes most days of the week and can do in 10 minute intervals    -     valsartan-hydrochlorothiazide (DIOVAN-HCT) 80-12.5 MG tablet; Take 1 tablet by mouth daily.  Acute respiratory failure with hypoxia (HCC) -     Ambulatory referral to Pulmonology  Other orders/ Acute cough  -     benzonatate (TESSALON) 200 MG capsule; Take 1 capsule (200 mg total) by mouth 3 (three) times daily as needed for cough. -     guaiFENesin 200 MG tablet; Take 2 tablets (400 mg total) by mouth every 8 (eight) hours as needed for cough or to loosen phlegm.     This note has been created with Education officer, environmental. Any transcriptional errors are unintentional.   Follow up with PCP  Grayce Sessions, NP 03/13/2024, 3:52 PM

## 2024-03-16 DIAGNOSIS — L299 Pruritus, unspecified: Secondary | ICD-10-CM | POA: Diagnosis not present

## 2024-03-16 DIAGNOSIS — L658 Other specified nonscarring hair loss: Secondary | ICD-10-CM | POA: Diagnosis not present

## 2024-03-16 DIAGNOSIS — L700 Acne vulgaris: Secondary | ICD-10-CM | POA: Diagnosis not present

## 2024-03-16 DIAGNOSIS — L219 Seborrheic dermatitis, unspecified: Secondary | ICD-10-CM | POA: Diagnosis not present

## 2024-03-16 DIAGNOSIS — L2084 Intrinsic (allergic) eczema: Secondary | ICD-10-CM | POA: Diagnosis not present

## 2024-03-16 DIAGNOSIS — L732 Hidradenitis suppurativa: Secondary | ICD-10-CM | POA: Diagnosis not present

## 2024-03-16 DIAGNOSIS — B079 Viral wart, unspecified: Secondary | ICD-10-CM | POA: Diagnosis not present

## 2024-03-16 DIAGNOSIS — L6681 Central centrifugal cicatricial alopecia: Secondary | ICD-10-CM | POA: Diagnosis not present

## 2024-03-21 ENCOUNTER — Ambulatory Visit: Attending: Nurse Practitioner | Admitting: Nurse Practitioner

## 2024-03-21 ENCOUNTER — Encounter: Payer: Self-pay | Admitting: Nurse Practitioner

## 2024-03-21 VITALS — BP 151/102 | HR 91 | Resp 19 | Ht 65.0 in | Wt 200.0 lb

## 2024-03-21 DIAGNOSIS — I700 Atherosclerosis of aorta: Secondary | ICD-10-CM | POA: Diagnosis not present

## 2024-03-21 DIAGNOSIS — D72829 Elevated white blood cell count, unspecified: Secondary | ICD-10-CM

## 2024-03-21 DIAGNOSIS — J4520 Mild intermittent asthma, uncomplicated: Secondary | ICD-10-CM | POA: Diagnosis not present

## 2024-03-21 DIAGNOSIS — E559 Vitamin D deficiency, unspecified: Secondary | ICD-10-CM | POA: Diagnosis not present

## 2024-03-21 DIAGNOSIS — M25552 Pain in left hip: Secondary | ICD-10-CM | POA: Diagnosis not present

## 2024-03-21 DIAGNOSIS — N3281 Overactive bladder: Secondary | ICD-10-CM

## 2024-03-21 DIAGNOSIS — R928 Other abnormal and inconclusive findings on diagnostic imaging of breast: Secondary | ICD-10-CM | POA: Diagnosis not present

## 2024-03-21 DIAGNOSIS — R7303 Prediabetes: Secondary | ICD-10-CM

## 2024-03-21 DIAGNOSIS — G8929 Other chronic pain: Secondary | ICD-10-CM

## 2024-03-21 DIAGNOSIS — I10 Essential (primary) hypertension: Secondary | ICD-10-CM

## 2024-03-21 MED ORDER — ALBUTEROL SULFATE HFA 108 (90 BASE) MCG/ACT IN AERS: 2.0000 | INHALATION_SPRAY | Freq: Four times a day (QID) | RESPIRATORY_TRACT | 0 refills | Status: DC | PRN

## 2024-03-21 MED ORDER — OXYBUTYNIN CHLORIDE ER 5 MG PO TB24: 5.0000 mg | ORAL_TABLET | Freq: Every day | ORAL | 1 refills | Status: AC

## 2024-03-21 MED ORDER — VALSARTAN-HYDROCHLOROTHIAZIDE 160-25 MG PO TABS: 1.0000 | ORAL_TABLET | Freq: Every day | ORAL | 3 refills | Status: AC

## 2024-03-21 MED ORDER — ATORVASTATIN CALCIUM 10 MG PO TABS: 10.0000 mg | ORAL_TABLET | Freq: Every day | ORAL | 3 refills | Status: AC

## 2024-03-21 NOTE — Patient Instructions (Addendum)
 [facility] Address: [address] [place],  [place], [place] [address] Phone: [phone]    Placed in [facility]  [address] [place] [address] Ph# [phone]  [facility] Located in: [facility] Address: [address], [place], [place] [address] Phone: [phone]

## 2024-03-21 NOTE — Progress Notes (Addendum)
 Assessment & Plan:  Jasmin Stephenson was seen today for medical management of chronic issues.  Diagnoses and all orders for this visit:  Primary hypertension DOSE CHANGE -     valsartan-hydrochlorothiazide (DIOVAN-HCT) 160-25 MG tablet; Take 1 tablet by mouth daily. Continue all antihypertensives as prescribed.  Reminded to bring in blood pressure log for follow  up appointment.  RECOMMENDATIONS: DASH/Mediterranean Diets are healthier choices for HTN.    Chronic left hip pain -     Ambulatory referral to Orthopedic Surgery  OAB (overactive bladder) -     Ambulatory referral to Urology -     oxybutynin (DITROPAN-XL) 5 MG 24 hr tablet; Take 1 tablet (5 mg total) by mouth at bedtime. For overactive bladder  Prediabetes -     Hemoglobin A1c  Vitamin D deficiency -     VITAMIN D 25 Hydroxy (Vit-D Deficiency, Fractures)  Leukocytosis, unspecified type -     CBC with Differential  Mild intermittent asthma without complication -     albuterol (VENTOLIN HFA) 108 (90 Base) MCG/ACT inhaler; Inhale 2 puffs into the lungs every 6 (six) hours as needed for wheezing or shortness of breath.  Abnormal mammogram -     MM 3D DIAGNOSTIC MAMMOGRAM BILATERAL BREAST; Future -     [place] LIMITED ULTRASOUND INCLUDING AXILLA RIGHT BREAST; Future    Patient has been counseled on age-appropriate routine health concerns for screening and prevention. These are reviewed and up-to-date. Referrals have been placed accordingly. Immunizations are up-to-date or declined.    Subjective:   Chief Complaint  Patient presents with   Medical Management of Chronic Issues    Jasmin Stephenson 53 y.o. female presents to office today for follow-up to hypertension.  I have not seen her in over a year and a half.  Blood pressure is significantly elevated.  She has a past medical history of hypertension, anxiety, tobacco dependence, seasonal allergies, asthma, bipolar disorder, overactive bladder, depression, GERD, hyperlipidemia,  MI with no cardiology follow-up, cervical neck fracture, PVD, hysterectomy  The ASCVD Risk score (Arnett DK, et al., 2019) failed to calculate for the following reasons:   Risk score cannot be calculated because patient has a medical history suggesting prior/existing ASCVD   She was recently started on Diovan HCT 80-12.5 mg daily.  As blood pressure is not controlled today we will increase dose to 160-25. BP Readings from Last 3 Encounters:  03/21/24 (!) 151/102  03/07/24 (!) 146/92  02/21/24 125/86     She has a history of overactive bladder with symptoms of mixed stress-urge urinary incontinence. She has never taken any prescription medications for her symptoms.  She endorses chronic left hip pain. States she has been experiencing pain in her left hip since she had a hysterectomy and they had to remove a "Cup" from her left side. Hip pain is worsened with standing and unbearable when lying on the left side. She denies suffering a prior hip injury and no known history of arthritis. She has attempted relief with OTC medication with only mild relief of symptoms. XRAY of HIP and pelvis in 2018 was normal.   Review of Systems  Constitutional:  Negative for fever, malaise/fatigue and weight loss.  HENT: Negative.  Negative for nosebleeds.   Eyes: Negative.  Negative for blurred vision, double vision and photophobia.  Respiratory: Negative.  Negative for cough and shortness of breath.   Cardiovascular: Negative.  Negative for chest pain, palpitations and leg swelling.  Gastrointestinal: Negative.  Negative for heartburn, nausea  and vomiting.  Genitourinary:        OAB  Musculoskeletal:  Positive for joint pain and myalgias.  Neurological: Negative.  Negative for dizziness, focal weakness, seizures and headaches.  Psychiatric/Behavioral: Negative.  Negative for suicidal ideas.     Past Medical History:  Diagnosis Date   Allergy    Anemia    1 unit   Anxiety    RA   Arthritis    and  tendonitis   Asthma    Bipolar disorder (HCC)    Breast calcification, right    Cervical herniated disc 2017   Depression    Dysmenorrhea 11/2020   Family history of adverse reaction to anesthesia    GERD (gastroesophageal reflux disease)    History of blood transfusion    Hyperlipidemia    Menorrhagia 11/2020   Myocardial infarction [facility]) yrs ago   " Mild" no cardiologist   Neck fracture (HCC)    cervical neck fracture   Peripheral vascular disease (HCC)    Vitamin D deficiency     Past Surgical History:  Procedure Laterality Date   ABDOMINAL SURGERY     APPENDECTOMY  yrs ago   BLADDER SURGERY     BREAST EXCISIONAL BIOPSY Right    CERVICAL DISC ARTHROPLASTY N/A 09/21/2016   Procedure: CERVICAL SIX- CERVICAL SEVEN ARTHROPLASTY;  Surgeon: Loura Halt Ditty, MD;  Location: [facility] OR;  Service: Neurosurgery;  Laterality: N/A;  C6-7 Arthroplasty   CHOLECYSTECTOMY N/A 10/10/2019   Procedure: LAPAROSCOPIC CHOLECYSTECTOMY WITH INTRAOPERATIVE CHOLANGIOGRAM;  Surgeon: Manus Rudd, MD;  Location: [facility] OR;  Service: General;  Laterality: N/A;   FOOT SURGERY Left    corrections on foot   IR ANGIOGRAM SELECTIVE EACH ADDITIONAL VESSEL  05/26/2021   IR ANGIOGRAM VISCERAL SELECTIVE  05/26/2021   IR EMBO TUMOR ORGAN ISCHEMIA INFARCT INC GUIDE ROADMAPPING  05/26/2021   IR RADIOLOGIST EVAL & MGMT  03/19/2021   IR RADIOLOGIST EVAL & MGMT  06/10/2021   IR RADIOLOGIST EVAL & MGMT  01/05/2022   IR RENAL BILAT S&I MOD SED  05/26/2021   IR US GUIDE VASC ACCESS LEFT  05/26/2021   IRRIGATION AND DEBRIDEMENT ABSCESS Right 09/10/2014   Procedure: IRRIGATION AND DEBRIDEMENT RIGHT BREAST  ABSCESS;  Surgeon: Atilano Ina, MD;  Location: [facility];  Service: General;  Laterality: Right;   SYNDACTYLIZATION Right 01/08/2021   Procedure: SYNDACTYLIZATION;  Surgeon: Park Liter, DPM;  Location: [facility];  Service: Podiatry;  Laterality: Right;   TUBAL LIGATION     per pt    VAGINAL  HYSTERECTOMY Bilateral 04/21/2022   Procedure: VAGINAL HYSTERECTOMY, BILATERAL SALPINGECTOMY;  Surgeon: Warden Fillers, MD;  Location: [facility] OR;  Service: Gynecology;  Laterality: Bilateral;   WISDOM TOOTH EXTRACTION      Family History  Problem Relation Age of Onset   Diabetes Father    CAD Father    Breast cancer Sister 42   Alcohol abuse Neg Hx    Arthritis Neg Hx    Asthma Neg Hx    Birth defects Neg Hx    Cancer Neg Hx    COPD Neg Hx    Depression Neg Hx    Drug abuse Neg Hx    Early death Neg Hx    Hearing loss Neg Hx    Heart disease Neg Hx    Hyperlipidemia Neg Hx    Hypertension Neg Hx    Kidney disease Neg Hx    Learning disabilities Neg Hx  Mental illness Neg Hx    Mental retardation Neg Hx    Miscarriages / Stillbirths Neg Hx    Stroke Neg Hx    Vision loss Neg Hx     Social History Reviewed with no changes to be made today.   Outpatient Medications Prior to Visit  Medication Sig Dispense Refill   benzonatate (TESSALON) 200 MG capsule Take 1 capsule (200 mg total) by mouth 3 (three) times daily as needed for cough. 30 capsule 0   CHOLECALCIFEROL PO Take 1 tablet by mouth daily. Strength unknown     valsartan-hydrochlorothiazide (DIOVAN-HCT) 80-12.5 MG tablet Take 1 tablet by mouth daily. 90 tablet 3   guaiFENesin 200 MG tablet Take 2 tablets (400 mg total) by mouth every 8 (eight) hours as needed for cough or to loosen phlegm. (Patient not taking: Reported on 03/21/2024) 60 tablet 0   No facility-administered medications prior to visit.    Allergies  Allergen Reactions   Penicillins Nausea And Vomiting and Rash    Has patient had a PCN reaction causing immediate rash, facial/tongue/throat swelling, SOB or lightheadedness with hypotension: Yes Has patient had a PCN reaction causing severe rash involving mucus membranes or skin necrosis: Yes Has patient had a PCN reaction that required hospitalization No Has patient had a PCN reaction occurring within the  last 10 years: No If all of the above answers are "NO", then may proceed with Cephalosporin use.       Objective:    BP (!) 151/102 (BP Location: Left Arm, Patient Position: Sitting, Cuff Size: Normal)   Pulse 91   Resp 19   Ht 5\' 5"  (1.651 m)   Wt 200 lb (90.7 kg)   LMP 03/27/2022 Comment: DOS UPREG NEGATIVE  SpO2 100%   BMI 33.28 kg/m  Wt Readings from Last 3 Encounters:  03/21/24 200 lb (90.7 kg)  03/07/24 201 lb 6.4 oz (91.4 kg)  02/21/24 193 lb 12.8 oz (87.9 kg)    Physical Exam Vitals and nursing note reviewed.  Constitutional:      Appearance: She is well-developed.  HENT:     Head: Normocephalic and atraumatic.  Cardiovascular:     Rate and Rhythm: Normal rate and regular rhythm.     Heart sounds: Normal heart sounds. No murmur heard.    No friction rub. No gallop.  Pulmonary:     Effort: Pulmonary effort is normal. No tachypnea or respiratory distress.     Breath sounds: Normal breath sounds. No decreased breath sounds, wheezing, rhonchi or rales.  Chest:     Chest wall: No tenderness.  Abdominal:     General: Bowel sounds are normal.     Palpations: Abdomen is soft.  Musculoskeletal:        General: Normal range of motion.     Cervical back: Normal range of motion.  Skin:    General: Skin is warm and dry.  Neurological:     Mental Status: She is alert and oriented to person, place, and time.     Coordination: Coordination normal.  Psychiatric:        Behavior: Behavior normal. Behavior is cooperative.        Thought Content: Thought content normal.        Judgment: Judgment normal.          Patient has been counseled extensively about nutrition and exercise as well as the importance of adherence with medications and regular follow-up. The patient was given clear instructions to go to ER  or return to medical center if symptoms don't improve, worsen or new problems develop. The patient verbalized understanding.   Follow-up: No follow-ups on file.    Claiborne Rigg, FNP-BC [facility] [place], [place] [phone]   03/21/2024, 5:31 PM

## 2024-03-22 LAB — CBC WITH DIFFERENTIAL/PLATELET
Basophils Absolute: 0 10*3/uL (ref 0.0–0.2)
Basos: 0 %
EOS (ABSOLUTE): 0.2 10*3/uL (ref 0.0–0.4)
Eos: 2 %
Hematocrit: 44.9 % (ref 34.0–46.6)
Hemoglobin: 15 g/dL (ref 11.1–15.9)
Immature Grans (Abs): 0 10*3/uL (ref 0.0–0.1)
Immature Granulocytes: 0 %
Lymphocytes Absolute: 2.9 10*3/uL (ref 0.7–3.1)
Lymphs: 33 %
MCH: 30.4 pg (ref 26.6–33.0)
MCHC: 33.4 g/dL (ref 31.5–35.7)
MCV: 91 fL (ref 79–97)
Monocytes Absolute: 0.7 10*3/uL (ref 0.1–0.9)
Monocytes: 8 %
Neutrophils Absolute: 5 10*3/uL (ref 1.4–7.0)
Neutrophils: 57 %
Platelets: 480 10*3/uL — ABNORMAL HIGH (ref 150–450)
RBC: 4.93 x10E6/uL (ref 3.77–5.28)
RDW: 13.4 % (ref 11.7–15.4)
WBC: 8.9 10*3/uL (ref 3.4–10.8)

## 2024-03-22 LAB — HEMOGLOBIN A1C
Est. average glucose Bld gHb Est-mCnc: 131 mg/dL
Hgb A1c MFr Bld: 6.2 % — ABNORMAL HIGH (ref 4.8–5.6)

## 2024-03-22 LAB — VITAMIN D 25 HYDROXY (VIT D DEFICIENCY, FRACTURES): Vit D, 25-Hydroxy: 24.8 ng/mL — ABNORMAL LOW (ref 30.0–100.0)

## 2024-04-19 ENCOUNTER — Other Ambulatory Visit: Payer: Self-pay | Admitting: Nurse Practitioner

## 2024-04-19 DIAGNOSIS — J4520 Mild intermittent asthma, uncomplicated: Secondary | ICD-10-CM

## 2024-04-26 ENCOUNTER — Telehealth: Payer: Self-pay | Admitting: Nurse Practitioner

## 2024-04-26 NOTE — Telephone Encounter (Signed)
 Return call unanswered by patient.

## 2024-04-26 NOTE — Telephone Encounter (Signed)
 Copied from CRM [id number]. Topic: General - Other >> Apr 26, 2024 10:10 AM Marissa P wrote:  Reason for CRM: Patient called to see about a letter that came in for her in regards to some results for her she believes, please follow up with patient [phone]

## 2024-06-13 ENCOUNTER — Encounter

## 2024-06-13 ENCOUNTER — Other Ambulatory Visit

## 2024-06-21 ENCOUNTER — Other Ambulatory Visit: Payer: Self-pay | Admitting: Nurse Practitioner

## 2024-06-21 DIAGNOSIS — N6310 Unspecified lump in the right breast, unspecified quadrant: Secondary | ICD-10-CM

## 2024-06-21 DIAGNOSIS — D72829 Elevated white blood cell count, unspecified: Secondary | ICD-10-CM

## 2024-06-21 DIAGNOSIS — J4520 Mild intermittent asthma, uncomplicated: Secondary | ICD-10-CM

## 2024-06-21 DIAGNOSIS — G8929 Other chronic pain: Secondary | ICD-10-CM

## 2024-06-21 DIAGNOSIS — N3281 Overactive bladder: Secondary | ICD-10-CM

## 2024-06-21 DIAGNOSIS — I10 Essential (primary) hypertension: Secondary | ICD-10-CM

## 2024-06-21 DIAGNOSIS — I700 Atherosclerosis of aorta: Secondary | ICD-10-CM

## 2024-06-21 DIAGNOSIS — R7303 Prediabetes: Secondary | ICD-10-CM

## 2024-06-21 DIAGNOSIS — R928 Other abnormal and inconclusive findings on diagnostic imaging of breast: Secondary | ICD-10-CM

## 2024-06-21 DIAGNOSIS — N3946 Mixed incontinence: Secondary | ICD-10-CM | POA: Diagnosis not present

## 2024-06-21 DIAGNOSIS — E559 Vitamin D deficiency, unspecified: Secondary | ICD-10-CM

## 2024-06-22 ENCOUNTER — Ambulatory Visit: Admitting: Obstetrics and Gynecology

## 2024-06-23 ENCOUNTER — Inpatient Hospital Stay: Admission: RE | Admit: 2024-06-23 | Source: Ambulatory Visit

## 2024-06-23 ENCOUNTER — Encounter

## 2024-07-03 DIAGNOSIS — N3946 Mixed incontinence: Secondary | ICD-10-CM | POA: Diagnosis not present

## 2024-07-03 DIAGNOSIS — R350 Frequency of micturition: Secondary | ICD-10-CM | POA: Diagnosis not present

## 2024-07-12 ENCOUNTER — Ambulatory Visit: Admitting: Obstetrics and Gynecology

## 2024-07-24 ENCOUNTER — Ambulatory Visit: Admitting: Obstetrics and Gynecology

## 2024-07-24 ENCOUNTER — Encounter: Payer: Self-pay | Admitting: Obstetrics and Gynecology

## 2024-07-24 VITALS — BP 117/82 | HR 80 | Ht 65.0 in | Wt 191.7 lb

## 2024-07-24 DIAGNOSIS — M25552 Pain in left hip: Secondary | ICD-10-CM | POA: Diagnosis not present

## 2024-07-24 NOTE — Progress Notes (Signed)
 Having issues with hip pain and mobility ~4 months after surgery. Taking tylenol  for pain. Declines STI testing. Will defer pap until another time.

## 2024-07-24 NOTE — Progress Notes (Signed)
  CC: hip pain Subjective:    Patient ID: Jasmin Stephenson, female    DOB: [birth year], 53 y.o.   MRN: [id number]  HPI  Pt seen for discussion of hip pain.  She is s/p TVH in 2023.  Pt states we told her she had issues with her hip cuff during surgery.  Op note reviewed in detail.  No issues with lower extremities during surgery and this was not mentioned at the post op visit.  Pt states she has had long standing left hip pain even before surgery.  MD relayed to patient that there is no documentation or recollection of any hip surgeries in the prior procedure.  No mention of hip issues with the patient's hernia repair by general surgery either.  Review of Systems     Objective:   Physical Exam Vitals:   07/24/24 1030 07/24/24 1035  BP: (!) 146/91 117/82  Pulse: 79 80         Assessment & Plan:   1. Left hip pain (Primary) MD unable to answer patient's questions about hip pain or treatment plan. Will refer to orthopedic surgery for further evaluation.  - Ambulatory referral to Orthopedic Surgery  I spent 10 minutes dedicated to the care of this patient including previsit review of records, face to face time with the patient discussing previous procedure and documentation and post visit testing.   Jerilynn DELENA Buddle, MD Faculty Attending, [facility]
